# Patient Record
Sex: Female | Born: 1955
Health system: Southern US, Community
[De-identification: ages and names within clinical notes are randomized; demographics above are authoritative.]

## PROBLEM LIST (undated history)

## (undated) DIAGNOSIS — I639 Cerebral infarction, unspecified: Secondary | ICD-10-CM

## (undated) DIAGNOSIS — I10 Essential (primary) hypertension: Secondary | ICD-10-CM

## (undated) DIAGNOSIS — D6851 Activated protein C resistance: Secondary | ICD-10-CM

## (undated) DIAGNOSIS — D649 Anemia, unspecified: Secondary | ICD-10-CM

## (undated) HISTORY — DX: Anemia, unspecified: D64.9

## (undated) HISTORY — DX: Essential (primary) hypertension: I10

## (undated) HISTORY — DX: Cerebral infarction, unspecified: I63.9

## (undated) HISTORY — DX: Activated protein C resistance: D68.51

---

## 1997-09-13 ENCOUNTER — Other Ambulatory Visit: Admission: RE | Admit: 1997-09-13 | Discharge: 1997-09-13 | Payer: Self-pay | Admitting: Obstetrics and Gynecology

## 1997-10-19 ENCOUNTER — Other Ambulatory Visit: Admission: RE | Admit: 1997-10-19 | Discharge: 1997-10-19 | Payer: Self-pay | Admitting: General Surgery

## 1997-10-29 ENCOUNTER — Ambulatory Visit (HOSPITAL_BASED_OUTPATIENT_CLINIC_OR_DEPARTMENT_OTHER): Admission: RE | Admit: 1997-10-29 | Discharge: 1997-10-29 | Payer: Self-pay | Admitting: General Surgery

## 1998-09-19 ENCOUNTER — Other Ambulatory Visit: Admission: RE | Admit: 1998-09-19 | Discharge: 1998-09-19 | Payer: Self-pay | Admitting: Obstetrics and Gynecology

## 1999-11-07 ENCOUNTER — Other Ambulatory Visit: Admission: RE | Admit: 1999-11-07 | Discharge: 1999-11-07 | Payer: Self-pay | Admitting: Obstetrics and Gynecology

## 1999-11-30 ENCOUNTER — Other Ambulatory Visit: Admission: RE | Admit: 1999-11-30 | Discharge: 1999-11-30 | Payer: Self-pay | Admitting: Obstetrics and Gynecology

## 2000-01-11 ENCOUNTER — Other Ambulatory Visit: Admission: RE | Admit: 2000-01-11 | Discharge: 2000-01-11 | Payer: Self-pay | Admitting: General Surgery

## 2000-05-09 ENCOUNTER — Encounter: Admission: RE | Admit: 2000-05-09 | Discharge: 2000-05-09 | Payer: Self-pay | Admitting: General Surgery

## 2000-05-09 ENCOUNTER — Encounter: Payer: Self-pay | Admitting: General Surgery

## 2000-11-11 ENCOUNTER — Other Ambulatory Visit: Admission: RE | Admit: 2000-11-11 | Discharge: 2000-11-11 | Payer: Self-pay | Admitting: Obstetrics and Gynecology

## 2000-11-12 ENCOUNTER — Encounter (INDEPENDENT_AMBULATORY_CARE_PROVIDER_SITE_OTHER): Payer: Self-pay | Admitting: Specialist

## 2000-11-12 ENCOUNTER — Other Ambulatory Visit: Admission: RE | Admit: 2000-11-12 | Discharge: 2000-11-12 | Payer: Self-pay | Admitting: Obstetrics and Gynecology

## 2001-08-14 ENCOUNTER — Encounter: Admission: RE | Admit: 2001-08-14 | Discharge: 2001-08-14 | Payer: Self-pay | Admitting: Family Medicine

## 2001-08-14 ENCOUNTER — Encounter: Payer: Self-pay | Admitting: Family Medicine

## 2001-11-11 ENCOUNTER — Other Ambulatory Visit: Admission: RE | Admit: 2001-11-11 | Discharge: 2001-11-11 | Payer: Self-pay | Admitting: Obstetrics and Gynecology

## 2002-11-16 ENCOUNTER — Other Ambulatory Visit: Admission: RE | Admit: 2002-11-16 | Discharge: 2002-11-16 | Payer: Self-pay | Admitting: Obstetrics and Gynecology

## 2002-11-18 ENCOUNTER — Encounter: Payer: Self-pay | Admitting: Emergency Medicine

## 2002-11-18 ENCOUNTER — Inpatient Hospital Stay (HOSPITAL_COMMUNITY): Admission: EM | Admit: 2002-11-18 | Discharge: 2002-11-20 | Payer: Self-pay | Admitting: Emergency Medicine

## 2002-11-18 ENCOUNTER — Encounter: Payer: Self-pay | Admitting: Neurology

## 2002-11-19 ENCOUNTER — Encounter (INDEPENDENT_AMBULATORY_CARE_PROVIDER_SITE_OTHER): Payer: Self-pay | Admitting: Cardiology

## 2002-11-23 ENCOUNTER — Encounter: Payer: Self-pay | Admitting: Internal Medicine

## 2002-11-23 ENCOUNTER — Ambulatory Visit (HOSPITAL_COMMUNITY): Admission: RE | Admit: 2002-11-23 | Discharge: 2002-11-23 | Payer: Self-pay | Admitting: Internal Medicine

## 2003-01-22 ENCOUNTER — Ambulatory Visit (HOSPITAL_COMMUNITY): Admission: RE | Admit: 2003-01-22 | Discharge: 2003-01-22 | Payer: Self-pay | Admitting: Internal Medicine

## 2003-01-22 ENCOUNTER — Encounter: Payer: Self-pay | Admitting: Internal Medicine

## 2004-01-06 ENCOUNTER — Other Ambulatory Visit: Admission: RE | Admit: 2004-01-06 | Discharge: 2004-01-06 | Payer: Self-pay | Admitting: Obstetrics and Gynecology

## 2004-02-08 ENCOUNTER — Encounter: Admission: RE | Admit: 2004-02-08 | Discharge: 2004-02-08 | Payer: Self-pay | Admitting: Obstetrics and Gynecology

## 2004-05-17 ENCOUNTER — Inpatient Hospital Stay (HOSPITAL_COMMUNITY): Admission: EM | Admit: 2004-05-17 | Discharge: 2004-05-19 | Payer: Self-pay | Admitting: Unknown Physician Specialty

## 2004-05-18 ENCOUNTER — Ambulatory Visit: Payer: Self-pay | Admitting: Cardiovascular Disease

## 2005-01-11 ENCOUNTER — Other Ambulatory Visit: Admission: RE | Admit: 2005-01-11 | Discharge: 2005-01-11 | Payer: Self-pay | Admitting: Obstetrics and Gynecology

## 2010-05-30 ENCOUNTER — Ambulatory Visit: Admit: 2010-05-30 | Payer: Self-pay | Admitting: Obstetrics and Gynecology

## 2010-10-02 ENCOUNTER — Encounter: Payer: Self-pay | Admitting: Family Medicine

## 2010-10-06 NOTE — H&P (Signed)
[name], [name]                           ACCOUNT NO.:  [id number]   MEDICAL RECORD NO.:  [id number]                   PATIENT TYPE:  EMS   LOCATION:  MAJO                                 FACILITY:  [facility]   PHYSICIAN:  Genene Churn. Love, M.D.                 DATE OF BIRTH:  1955-12-09   DATE OF ADMISSION:  11/18/2002  DATE OF DISCHARGE:                                HISTORY & PHYSICAL   HISTORY OF PRESENT ILLNESS:  This is the first [facility] admission  for this 55 year old right-handed white married female from [place],  [place], admitted from the emergency room for evaluation of numbness  and right brain stroke.   HISTORY OF PRESENT ILLNESS:  This patient has had no history of high blood  pressure, diabetes, heart disease, stroke, cigarette, or drug use, hormone  replacement therapy, head or neck trauma, or migraine.  On Saturday, November 14, 2002, she noted the onset of left thumb and index finger numbness and  Sunday morning she noted numbness in the left third, fourth, and fifth  finger.  She had some intermittent tightness and tingling in her left hand  since that time. This morning, she was at work and noted left face and tip  of the nose numbness, called her physician who recommended that she come to  the emergency room for further evaluation.  She had been seen by the  physician's associate the day prior to this admission with suspected carpal  tunnel syndrome and placed on Mobic 7.5 mg b.i.d.  She has not been on any  aspirin or other medications.   PAST MEDICAL HISTORY:  Significant for right breast cyst removed in 1997,  left eye cyst surgery in 2004.  She has had no injuries.  She has had a  hospitalization for two children.   MEDICATIONS:  Mobic 7.5 mg b.i.d., but has only been on this for one day.   SOCIAL HISTORY:  She was educated through two years of college and works as  a Diplomatic Services operational officer.  She drinks three drinks of alcohol per day.   ALLERGIES:  DONNATAL, CODEINE, LIBRIUM, AND LIBRAX.   FAMILY HISTORY:  Her mother died at 51 from stroke. Her father died at age  70 from cancer and had hypertension. She has one brother 87 living and well.  She has one sister who died at 80 from breast cancer. She has children,  daughters 81 and 47 who are both living and well.   PHYSICAL EXAMINATION:  GENERAL: A well-developed, pleasant white female in  no acute distress.  VITAL SIGNS: Blood pressure in the right and left arm 140/80, heart rate 64,  there were no bruits.  NECK:  Flexion and extension maneuvers are unremarkable.  MENTAL STATUS: She was alert and oriented x3 and followed one, two, and  three-step commands.  There was no denial of her left  side or denial of  illness.  Her cranial nerve examination revealed visual fields to be full,  but discs were flat. The extraocular movements were full and corneals were  present. There was no 7th nerve palsy. Hearing was intact. Air conduction  was greater than bone conduction. Tongue was midline, uvula was midline and  gags were present.  Sternocleidomastoid and trapezius grossly normal.  Motor  examination revealed 5/5 strength in the right and left arm and in the right  and left leg, but some clumsiness in the left hand with some evidence of  decreased rapid alternating movement skills in the left hand. She had an  outstretched hand and arm tremor. Sensory examination was intact to  pinprick, touch, position, and vibration testing.  Deep tendon reflexes were  2+. Plantar responses were downgoing.  HEENT:  Tympanic membranes clear.  LUNGS:  Clear.  HEART:  Systolic murmur.  ABDOMEN: Bowel sounds were normal. There is no enlargement of the liver,  spleen, or kidneys.  There was no cyanosis, clubbing, or edema in the  extremities.   MRI study showed right parietal ischemic stroke. MRA showed some  irregularity to the right middle cerebral artery and PCA arteries.   IMPRESSION:   1. Right brain stroke.  343.01  2. Heart murmur.  295.2.   PLAN:  Work for atherosclerotic vascular disease and inflammatory vessel  disease and get a cardiac workup and place her on aspirin.                                               Genene Churn. Sandria Manly, M.D.    JML/MEDQ  D:  11/18/2002  T:  11/18/2002  Job:  [id number]   cc:   Ernestina Penna, M.D.  [address] [place]  [place] [address]  Fax: [phone]    cc:   Ernestina Penna, M.D.  [address] [place]  [place] [address]  Fax: [phone]

## 2010-10-06 NOTE — Discharge Summary (Signed)
[name], [name]                           ACCOUNT NO.:  [id number]   MEDICAL RECORD NO.:  [id number]                   PATIENT TYPE:  INP   LOCATION:  3020                                 FACILITY:  [facility]   PHYSICIAN:  Santina Evans A. Orlin Hilding, M.D.          DATE OF BIRTH:  03/05/1956   DATE OF ADMISSION:  11/18/2002  DATE OF DISCHARGE:  11/20/2002                                 DISCHARGE SUMMARY   DISCHARGE DIAGNOSES:  1. Right  middle cerebral artery embolic infarction.  2. Unknown transesophageal echocardiogram abnormality.   DISCHARGE MEDICATIONS:  Aspirin 325 mg daily.   PROCEDURES:  1. MRI of the brain shows a subacute brand right MCA distribution     infarction.  A MRA of the brain showed left posterior cerebral and     partial right middle cerebral artery distribution atherosclerosis. Early     bifurcation of the right M1 segment with some irregularity and stenotic     change.  2. Carotid Doppler normal.  3. Two-dimensional echocardiogram normal.  4. Transesophageal echocardiogram performed by Dr. Dietrich Pates showing left     atrium, left atrial appendage, right atrium without masses. No patent     foramen ovale by color Doppler or with injection of __________ saline.     Aortic valve is trileaflet, no right coronary cusp with question     echodensity, question redundancy of the leaflet, question separate mass     and long axis best seen near sinotubular junction. Trivial tricuspid     regurgitation, mild mitral regurgitation.  Left ventricle and right     ventricle normal. Ascending and descending thoracic aorta, otherwise,     normal.  5. Chest x-ray shows no acute disease. Scoliosis.   LABORATORY DATA:  Sodium 140, potassium 3.6, chloride 106, CO2 26, glucose  100, BUN 9, creatinine 0.7, calcium 9.3. Total protein 6.7, albumin 12.0,  AST 22, ALT 18, alkaline phosphatase 31, total bilirubin 1.3. Antithrombin  397.  Cardiolipin studies normal. CBC:  Hemoglobin 14.4,  hematocrit 42.1,  white blood cells 7, platelets 194, differential normal. Sed rate 0. ANA  negative. Antilipid antibody pending. Urine with 15 ketones; otherwise,  negative. Urine drug screen negative. Lupus anticoagulant not detected.  Protein S functional 92, protein C functional 104, G20210 pending,  homocysteine pending. Lipids with cholesterol 155, triglycerides 70, HDL 76  and LDL 63.   HISTORY OF PRESENT ILLNESS:  [name] is a 55 year old right-handed  female with no significant medical history who notes the onset of left thumb  and index finger numbness Saturday morning. Sunday morning she noticed  numbness in the third, fourth and fifth finger. She had intermittent  tightness and tingling in her left hand since that time. This morning when  she was at work she noticed left face and tip of the nose numbness. She saw  the physician who recommended her to come to the emergency  room for  evaluation.   A CT scan was canceled and a MRI was done instead through the emergency  room. It was positive for acute infarction. She was admitted for a further  stroke workup. She was not a TPA candidate secondary to time and severity of  symptoms.   HOSPITAL COURSE:  A MRI as above did reveal acute infarction. A workup was  negative and a TEE was performed. There was an abnormality on the TEE that  Dr. Dietrich Pates who performed it is unsure what it means. She is going to  have Dr. Charlton Haws also review and send a copy of the film to me  accompanied by their opinion. The patient will be put on antiplatelet of  aspirin and follow up with Dr. Orlin Hilding and Dr. Tenny Craw for further treatment.   CONDITION ON DISCHARGE:  The patient was alert and oriented x3. Speech  clear. No aphasia. No facial weakness. Visual fields were full and  extraocular movements were intact.  Chest clear to auscultation. Heart rate  regular. Strength is 5/5 with some patient perceived weakness in her left  upper  extremity. Gait is steady.   DISCHARGE PLAN:  1. Discharge home with husband.  2. Aspirin for secondary stroke prevention.  3. Follow up with Dr. Tenny Craw. Call her for appointment time. Will follow up     with Dr. Eden Emms in the [facility] of the TEE results.  4. Follow up with Dr. Orlin Hilding in for weeks.     Annie Main, N.P.                         Catherine A. Orlin Hilding, M.D.    SB/MEDQ  D:  11/20/2002  T:  11/22/2002  Job:  [id number]

## 2010-10-06 NOTE — Discharge Summary (Signed)
[name], [name]                 ACCOUNT NO.:  [id number]   MEDICAL RECORD NO.:  [id number]          PATIENT TYPE:  INP   LOCATION:  3017                         FACILITY:  [facility]   PHYSICIAN:  Pramod P. Pearlean Brownie, MD    DATE OF BIRTH:  06-02-1955   DATE OF ADMISSION:  05/17/2004  DATE OF DISCHARGE:  05/19/2004                                 DISCHARGE SUMMARY   ADMISSION DIAGNOSIS:  Aphasia.   DISCHARGE DIAGNOSES:  1.  Left middle cerebral artery branch infarction of embolic etiology      without definite identified source of embolism.  2.  Intracranial arteriosclerotic disease.  3.  Remote right middle cerebral artery infarction.   HISTORY OF PRESENT ILLNESS:  [name] is a pleasant 55 year old Caucasian  lady who developed sudden onset of speech and language difficulties on the  night prior to admission but that improved and she went to sleep.  The next  day, the husband noticed that she had trouble expressing herself over the  phone when he talked to her from work.  She was making paraphrasic errors  but she could understand him quite well.  There was no headache, focal  extremity weakness, gait or balance problems.  The patient was seen by her  primary physician, who ordered an outpatient MRI scan which was done at  [facility], which showed hemorrhagic infarction along  the left temporal lobe.  The patient was referred to [facility] emergency  room for further evaluation where she was seen by me and admitted for stroke  risk stratification.  The patient was monitored on the stroke unit with  telemetry monitoring and not having significant cardiac arrhythmias.  An MRA  of the brain and the neck was done.  MRA of the brain showed intracranial  arteriosclerotic disease with occlusion of the left temporal branch.  MRA of  the neck was read as showing more severe 75 to 90% left ICA at the origin;  however, I thought this was an old reading.  Subsequent carotid  artery  ultrasound was done which revealed no hemodynamically significant stenosis  involving the neck with left internal carotid artery, velocities being only  86.3.  The patient previously had a right hemispheric infarction in July of  2004 for which no definite etiology had been found despite negative  transesophageal echocardiogram as well as hypercoagulable panel.  On the  transesophageal echocardiogram at that time there was questionable  abnormality mentioned due to which during the present admission, we got an  MRI scan of the heart to evaluate that abnormality further; however, the MRI  scan of the heart was interpreted by Charlton Haws, M.D., cardiologist, was  normal without any significant abnormality.  Transcranial Doppler bubble  study was performed by me to look for evidence of patent foramen ovale;  however, this was negative and no HITS (high intensity transient signals)  were noted.  The patient had previously been on aspirin.  This was changed  to Aggrenox secondary to stroke prevention.  She was advised to take  Aggrenox once a day  for the first 10 days and increase it to twice a day.  She was also advised to undergo outpatient hypercoagulable panel labs in a  few days' time.  During the hospitalization she was kept on a heparin drip  and remained stable, and her heparin level and platelet counts were  monitored.  On the day of discharge she had only occasional word-finding  difficulties and no significant paraphrasic errors.  There was no other  focal neurological deficit.   DISCHARGE MEDICATIONS:  Aggrenox one capsule once a day for 10 days and then  twice a day.   DISCHARGE INSTRUCTIONS:  The patient was advised to follow up with Dr.  Molly Maduro More, her primary physician, as needed and with Dr. Pearlean Brownie in two  months.  She was also advised to have outpatient hypercoagulable labs drawn.       PPS/MEDQ  D:  05/19/2004  T:  05/19/2004  Job:  [id number]   cc:   Molly Maduro  More  [address] Jadene Pierini  [place]  [place]  [address]  Fax: [phone]

## 2010-10-06 NOTE — H&P (Signed)
NAMEANABETH, CHILCOTT                 ACCOUNT NO.:  [id number]   MEDICAL RECORD NO.:  [id number]          PATIENT TYPE:  EMS   LOCATION:  MAJO                         FACILITY:  [facility]   PHYSICIAN:  Pramod P. Pearlean Brownie, MD    DATE OF BIRTH:  [birth year]   DATE OF ADMISSION:  05/17/2004  DATE OF DISCHARGE:                                HISTORY & PHYSICAL   HISTORY OF PRESENT ILLNESS:  Ms. Isaza is a 55 year old pleasant Caucasian  lady who developed some speech difficulties which began last night.  While  she was talking on the phone ordering a pizza, she had some trouble  speaking.  At that time, the husband did not think much about the symptoms  as she was just distracted.  Later on apparently, she spoke fine.  The  husband woke up this morning and went to work and at that time the wife just  spoke a few words and said hi and good morning.  A few hours later, when he  called back from work, he noticed that his wife had trouble speaking and  expressing herself over the phone. She was substituting words and using the  wrong words.   He called the primary physician who ordered an outpatient MRI which was done  at [facility] this afternoon which showed subacute left middle  cerebral artery branch infarction prompting referral to the emergency room  for further evaluation.  The patient has noticed some improvement in her  speech but she still had some word hesitancy and word finding difficulties.  She has noticed a headache since this evening which is mild to moderate and  left hemispheric but not severe.  She denies any vision, gait, or balance  difficulties.   PAST MEDICAL HISTORY:  Stroke a year and a half ago.  At that time, she had  intermittent paraesthesia in left hand as well as her face which were  initially thought to be carpal tunnel but subsequent evaluation revealed a  right hemispheric infarction.  She was admitted to [facility] and underwent stroke  evaluation but no apparently no specific  etiology was found.  She was placed on aspirin which she has been taking  regularly and has not been maintaining this.  Her past medical history is  otherwise unremarkable.   PAST SURGICAL HISTORY:  Benign breast cyst removed.   MEDICATIONS:  Aspirin.   ALLERGIES:  LIBRAX, CODEINE and DONNATAL.   SOCIAL HISTORY:  The patient lives at home with her husband.  She does not  smoke or drink.   FAMILY HISTORY:  Significant for her mother having multiple TIAs and massive  stroke.   REVIEW OF SYMPTOMS:  Not significant for any chest pain, palpitations,  cough, shortness of breath, or diarrhea.   PHYSICAL EXAMINATION:  GENERAL:  A pleasant middle-aged Caucasian lady who  is not in distress.  VITAL SIGNS:  She is afebrile.  Pulse is 72 per minute and regular sinus,  blood pressure 145/82.  HEENT:  Normocephalic and atraumatic.  Unremarkable.  NECK:  Supple without bruit.  CARDIOVASCULAR:  No murmur or gallop.  LUNGS:  Clear to auscultation.  NEUROLOGICAL:  The patient is pleasant, alert, and cooperative.  There is no  dysarthria.  She speaks fluently with occasional word finding difficulties.  She is able to name but has some difficulties with naming parts of objects.  She also has trouble repeating difficult sentences and can repeat only short  phrases.  She can understand quite well.  There is no slurred speech.  Movements are full.  Visual fields are full to confrontational testing.  Visual acuity is adequate.  Face is symmetric bilaterally.  Movements are  normal.  Tongue is midline.  Motor system exam reveals no upper extremity  drift.  Symmetric strength to reflexes, coordination, and sensation.  There  is no evidence of ataxia.  Knee-to-heel coordination are accurate.  Plantars  are downgoing.  Sensation is intact.   LABORATORY DATA:  MRI of the brain done at [facility] report  apparently shows acute to subacute infarct along the  left temporal and  parietal lobes.  The actual films are not available.  The patient also has  white mater changes.  EKG done today reveals normal sinus rhythm.   IMPRESSION:  A 55 year old lady with sudden onset of subtle language  difficulties due to left middle cerebral artery branch infarction, etiology  to be determined.  The patient had a previous history of right hemispheric  infarction with extensive evaluation which did not reveal a specific  etiology.   PLAN:  We will admit the patient to stroke service for further risk  stratification workup, telemetry monitoring.  Start her on IV heparin until  stroke evaluation is completed.  We will check a MRA of the brain and neck,  doppler studies, fasting lipid profile, hemoglobin A1C, and homocystine.  If  she has not had a transesophageal echocardiogram in the last admission, we  will consider doing this as well as hypercoagulable workup.       PPS/MEDQ  D:  05/17/2004  T:  05/17/2004  Job:  [id number]   cc:   Christell Constant, M.D.  [place], [place]

## 2012-08-11 ENCOUNTER — Telehealth: Payer: Self-pay | Admitting: Family Medicine

## 2012-08-11 NOTE — Telephone Encounter (Signed)
NEEDS APPT TODAY. SHE HAS A REALLY BAD COUGH, HEAD IS REALLY STOPPED UP AND HER CHEST IS CONGESTED

## 2012-08-11 NOTE — Telephone Encounter (Signed)
COUGH, SINUS, NO FEVER, NO APPTS AVAILABLE. REFUSED AOOT TOM. WILL GO TO URGENT CARE

## 2012-08-25 ENCOUNTER — Other Ambulatory Visit: Payer: Self-pay | Admitting: *Deleted

## 2012-08-25 MED ORDER — WARFARIN SODIUM 5 MG PO TABS: 5.0000 mg | ORAL_TABLET | ORAL | Status: DC

## 2012-08-28 ENCOUNTER — Ambulatory Visit (INDEPENDENT_AMBULATORY_CARE_PROVIDER_SITE_OTHER): Payer: 59 | Admitting: Pharmacist

## 2012-08-28 DIAGNOSIS — I639 Cerebral infarction, unspecified: Secondary | ICD-10-CM

## 2012-08-28 DIAGNOSIS — D6859 Other primary thrombophilia: Secondary | ICD-10-CM

## 2012-08-28 DIAGNOSIS — I6350 Cerebral infarction due to unspecified occlusion or stenosis of unspecified cerebral artery: Secondary | ICD-10-CM

## 2012-08-28 DIAGNOSIS — D6851 Activated protein C resistance: Secondary | ICD-10-CM | POA: Insufficient documentation

## 2012-08-28 LAB — POCT INR: INR: 3

## 2012-09-23 ENCOUNTER — Other Ambulatory Visit: Payer: Self-pay

## 2012-09-23 MED ORDER — WARFARIN SODIUM 5 MG PO TABS: 5.0000 mg | ORAL_TABLET | ORAL | Status: DC

## 2012-10-09 ENCOUNTER — Ambulatory Visit (INDEPENDENT_AMBULATORY_CARE_PROVIDER_SITE_OTHER): Payer: PRIVATE HEALTH INSURANCE | Admitting: Pharmacist

## 2012-10-09 DIAGNOSIS — D6851 Activated protein C resistance: Secondary | ICD-10-CM

## 2012-10-09 DIAGNOSIS — I6350 Cerebral infarction due to unspecified occlusion or stenosis of unspecified cerebral artery: Secondary | ICD-10-CM

## 2012-10-09 DIAGNOSIS — D6859 Other primary thrombophilia: Secondary | ICD-10-CM

## 2012-10-09 DIAGNOSIS — I639 Cerebral infarction, unspecified: Secondary | ICD-10-CM

## 2012-10-09 LAB — POCT INR: INR: 2.8

## 2012-10-24 ENCOUNTER — Other Ambulatory Visit: Payer: Self-pay | Admitting: *Deleted

## 2012-10-24 MED ORDER — WARFARIN SODIUM 5 MG PO TABS: 5.0000 mg | ORAL_TABLET | ORAL | Status: DC

## 2012-10-27 ENCOUNTER — Telehealth: Payer: Self-pay | Admitting: Family Medicine

## 2012-10-28 MED ORDER — LORATADINE 10 MG PO TABS: 10.0000 mg | ORAL_TABLET | Freq: Every day | ORAL | Status: DC

## 2012-10-28 NOTE — Telephone Encounter (Signed)
done

## 2012-11-05 ENCOUNTER — Encounter: Payer: Self-pay | Admitting: Family Medicine

## 2012-11-19 ENCOUNTER — Telehealth: Payer: Self-pay | Admitting: Pharmacist

## 2012-11-19 NOTE — Telephone Encounter (Signed)
Patient wanted to know about cellulite treatment and warfarin.  She visit [facility] and they counseled her about a procedure for cellulite but once they found out she was on warfarin the physician on staff said she was not a candidate.  I told patient I agreed with physician and suggested she could look into Thermage which is a cellulite treatment that can be used in patient's on warfarin.

## 2012-11-24 ENCOUNTER — Other Ambulatory Visit: Payer: Self-pay

## 2012-11-24 NOTE — Telephone Encounter (Signed)
Last PT  2/14

## 2012-11-25 MED ORDER — WARFARIN SODIUM 5 MG PO TABS: 5.0000 mg | ORAL_TABLET | ORAL | Status: DC

## 2012-11-27 ENCOUNTER — Ambulatory Visit (INDEPENDENT_AMBULATORY_CARE_PROVIDER_SITE_OTHER): Payer: PRIVATE HEALTH INSURANCE | Admitting: Pharmacist

## 2012-11-27 DIAGNOSIS — D6851 Activated protein C resistance: Secondary | ICD-10-CM

## 2012-11-27 DIAGNOSIS — I639 Cerebral infarction, unspecified: Secondary | ICD-10-CM

## 2012-11-27 DIAGNOSIS — D6859 Other primary thrombophilia: Secondary | ICD-10-CM

## 2012-11-27 DIAGNOSIS — I6350 Cerebral infarction due to unspecified occlusion or stenosis of unspecified cerebral artery: Secondary | ICD-10-CM

## 2012-11-27 LAB — POCT INR: INR: 2.6

## 2013-01-09 ENCOUNTER — Encounter: Payer: Self-pay | Admitting: Family Medicine

## 2013-01-22 ENCOUNTER — Ambulatory Visit (INDEPENDENT_AMBULATORY_CARE_PROVIDER_SITE_OTHER): Payer: PRIVATE HEALTH INSURANCE | Admitting: Pharmacist

## 2013-01-22 DIAGNOSIS — I6350 Cerebral infarction due to unspecified occlusion or stenosis of unspecified cerebral artery: Secondary | ICD-10-CM

## 2013-01-22 DIAGNOSIS — I639 Cerebral infarction, unspecified: Secondary | ICD-10-CM

## 2013-01-22 DIAGNOSIS — D6851 Activated protein C resistance: Secondary | ICD-10-CM

## 2013-01-22 DIAGNOSIS — D6859 Other primary thrombophilia: Secondary | ICD-10-CM

## 2013-01-22 LAB — POCT INR: INR: 3.5

## 2013-01-22 NOTE — Patient Instructions (Signed)
Anticoagulation Dose Instructions as of 01/22/2013     [name] Tue Wed Thu Fri Sat   New Dose 10 mg 10 mg 10 mg 10 mg 10 mg 10 mg 10 mg    Description       Hold for 1 day, then continue 10mg  daily      INR was 3.5 today

## 2013-02-19 ENCOUNTER — Ambulatory Visit (INDEPENDENT_AMBULATORY_CARE_PROVIDER_SITE_OTHER): Payer: PRIVATE HEALTH INSURANCE | Admitting: Pharmacist

## 2013-02-19 DIAGNOSIS — Z23 Encounter for immunization: Secondary | ICD-10-CM

## 2013-02-19 DIAGNOSIS — I639 Cerebral infarction, unspecified: Secondary | ICD-10-CM

## 2013-02-19 DIAGNOSIS — D6851 Activated protein C resistance: Secondary | ICD-10-CM

## 2013-02-19 DIAGNOSIS — D6859 Other primary thrombophilia: Secondary | ICD-10-CM

## 2013-02-19 DIAGNOSIS — I6350 Cerebral infarction due to unspecified occlusion or stenosis of unspecified cerebral artery: Secondary | ICD-10-CM

## 2013-04-02 ENCOUNTER — Ambulatory Visit (INDEPENDENT_AMBULATORY_CARE_PROVIDER_SITE_OTHER): Payer: PRIVATE HEALTH INSURANCE | Admitting: Pharmacist

## 2013-04-02 DIAGNOSIS — I6350 Cerebral infarction due to unspecified occlusion or stenosis of unspecified cerebral artery: Secondary | ICD-10-CM

## 2013-04-02 DIAGNOSIS — D6851 Activated protein C resistance: Secondary | ICD-10-CM

## 2013-04-02 DIAGNOSIS — D6859 Other primary thrombophilia: Secondary | ICD-10-CM

## 2013-04-02 DIAGNOSIS — I639 Cerebral infarction, unspecified: Secondary | ICD-10-CM

## 2013-04-02 LAB — POCT INR: INR: 1.9

## 2013-04-02 NOTE — Patient Instructions (Signed)
Anticoagulation Dose Instructions as of 04/02/2013     [name] Tue Wed Thu Fri Sat   New Dose 10 mg 10 mg 10 mg 10 mg 10 mg 10 mg 10 mg    Description       Take 12.5mg  today the continue 10mg  daily      INR was 1.9 today

## 2013-05-07 ENCOUNTER — Ambulatory Visit (INDEPENDENT_AMBULATORY_CARE_PROVIDER_SITE_OTHER): Payer: PRIVATE HEALTH INSURANCE | Admitting: Pharmacist

## 2013-05-07 DIAGNOSIS — I6350 Cerebral infarction due to unspecified occlusion or stenosis of unspecified cerebral artery: Secondary | ICD-10-CM

## 2013-05-07 DIAGNOSIS — I639 Cerebral infarction, unspecified: Secondary | ICD-10-CM

## 2013-05-07 DIAGNOSIS — D6851 Activated protein C resistance: Secondary | ICD-10-CM

## 2013-05-07 DIAGNOSIS — D6859 Other primary thrombophilia: Secondary | ICD-10-CM

## 2013-05-07 LAB — POCT INR: INR: 2

## 2013-05-07 NOTE — Patient Instructions (Addendum)
Anticoagulation Dose Instructions as of 05/07/2013     [name] Tue Wed Thu Fri Sat   New Dose 10 mg 10 mg 10 mg 10 mg 10 mg 10 mg 10 mg    Description       Continue 10mg  daily      INR was 2.0 today

## 2013-06-18 ENCOUNTER — Ambulatory Visit (INDEPENDENT_AMBULATORY_CARE_PROVIDER_SITE_OTHER): Payer: PRIVATE HEALTH INSURANCE | Admitting: Pharmacist

## 2013-06-18 DIAGNOSIS — Z23 Encounter for immunization: Secondary | ICD-10-CM

## 2013-06-18 DIAGNOSIS — D6859 Other primary thrombophilia: Secondary | ICD-10-CM

## 2013-06-18 DIAGNOSIS — D6851 Activated protein C resistance: Secondary | ICD-10-CM

## 2013-06-18 DIAGNOSIS — I6350 Cerebral infarction due to unspecified occlusion or stenosis of unspecified cerebral artery: Secondary | ICD-10-CM

## 2013-06-18 DIAGNOSIS — I639 Cerebral infarction, unspecified: Secondary | ICD-10-CM

## 2013-06-18 LAB — POCT INR: INR: 1.9

## 2013-06-18 NOTE — Patient Instructions (Signed)
Anticoagulation Dose Instructions as of 06/18/2013     [name] Mon Tue Wed Thu Fri Sat   New Dose 10 mg 10 mg 10 mg 10 mg 12.5 mg 10 mg 10 mg    Description       Increase to 2 and 1/2 tablets on Thursdays and 2 tablet all other days.      INR was 1.9 today

## 2013-06-19 ENCOUNTER — Other Ambulatory Visit: Payer: Self-pay | Admitting: *Deleted

## 2013-06-19 MED ORDER — DICLOFENAC SODIUM 1 % TD GEL: TRANSDERMAL | Status: DC

## 2013-06-19 NOTE — Telephone Encounter (Signed)
This may be refilled 

## 2013-06-19 NOTE — Telephone Encounter (Signed)
Received fax from pharmacy for refill but was not on current med list. Looks like it was originally rxd on 04-25-12. Please advise

## 2013-06-22 ENCOUNTER — Telehealth: Payer: Self-pay | Admitting: Pharmacist

## 2013-06-22 NOTE — Telephone Encounter (Signed)
Advised patient that voltaren gel may affect coumadin levels per Dr Christell ConstantMoore. Advised that when she came in to have PT/INR done that she should remind Tammy or Marcelino DusterMichelle that she is using.

## 2013-06-29 ENCOUNTER — Telehealth: Payer: Self-pay | Admitting: Pharmacist

## 2013-06-29 NOTE — Telephone Encounter (Signed)
Patient calls because dermatologist has prescribed minocycline in place of oracea.  She is wondering about the possible effect on INR / warfarin.  I spoke with patient and she will finish oracea that she has and then switch to minocycline which will be 2 weeks before her next appt.   I don't believe there will be much difference in effect of Oracea versus minocycline on INR sine they are in similar therapeutic classes but will be able to assess and make warfarin dosing adjustments with plan above.

## 2013-07-23 ENCOUNTER — Telehealth: Payer: Self-pay | Admitting: Pharmacist

## 2013-07-23 ENCOUNTER — Ambulatory Visit (INDEPENDENT_AMBULATORY_CARE_PROVIDER_SITE_OTHER): Payer: PRIVATE HEALTH INSURANCE | Admitting: Pharmacist

## 2013-07-23 DIAGNOSIS — I639 Cerebral infarction, unspecified: Secondary | ICD-10-CM

## 2013-07-23 DIAGNOSIS — D6851 Activated protein C resistance: Secondary | ICD-10-CM

## 2013-07-23 DIAGNOSIS — D6859 Other primary thrombophilia: Secondary | ICD-10-CM

## 2013-07-23 DIAGNOSIS — I6350 Cerebral infarction due to unspecified occlusion or stenosis of unspecified cerebral artery: Secondary | ICD-10-CM

## 2013-07-23 LAB — POCT INR: INR: 3.2

## 2013-07-23 NOTE — Telephone Encounter (Signed)
Patient called - wanted to verify warfarin dosing.

## 2013-08-16 ENCOUNTER — Other Ambulatory Visit: Payer: Self-pay | Admitting: Family Medicine

## 2013-09-03 ENCOUNTER — Ambulatory Visit (INDEPENDENT_AMBULATORY_CARE_PROVIDER_SITE_OTHER): Payer: 59 | Admitting: Pharmacist

## 2013-09-03 DIAGNOSIS — D6859 Other primary thrombophilia: Secondary | ICD-10-CM

## 2013-09-03 DIAGNOSIS — D6851 Activated protein C resistance: Secondary | ICD-10-CM

## 2013-09-03 DIAGNOSIS — I6350 Cerebral infarction due to unspecified occlusion or stenosis of unspecified cerebral artery: Secondary | ICD-10-CM

## 2013-09-03 DIAGNOSIS — I639 Cerebral infarction, unspecified: Secondary | ICD-10-CM

## 2013-09-03 LAB — POCT INR: INR: 2.3

## 2013-09-03 NOTE — Patient Instructions (Signed)
Anticoagulation Dose Instructions as of 09/03/2013     Emily SmilesSun Mon Tue Wed Thu Fri Sat   New Dose 10 mg 10 mg 10 mg 10 mg 10 mg 10 mg 10 mg    Description       Change back to 2 tablets every day      INR was 2.3 today

## 2013-10-14 ENCOUNTER — Telehealth: Payer: Self-pay | Admitting: Pharmacist

## 2013-10-14 NOTE — Telephone Encounter (Signed)
Patient has appt tomorrow for recheck protime - she is having some hand pain that she thinks might be related to lifting weights but it is a little better this afternoon.  If needed will work her in to see someone for evaluate hand pain tomorrow when here for protime. Patient called.

## 2013-10-15 ENCOUNTER — Ambulatory Visit (INDEPENDENT_AMBULATORY_CARE_PROVIDER_SITE_OTHER): Payer: 59 | Admitting: Pharmacist

## 2013-10-15 DIAGNOSIS — D6859 Other primary thrombophilia: Secondary | ICD-10-CM

## 2013-10-15 DIAGNOSIS — I6350 Cerebral infarction due to unspecified occlusion or stenosis of unspecified cerebral artery: Secondary | ICD-10-CM

## 2013-10-15 DIAGNOSIS — D6851 Activated protein C resistance: Secondary | ICD-10-CM

## 2013-10-15 DIAGNOSIS — I639 Cerebral infarction, unspecified: Secondary | ICD-10-CM

## 2013-10-15 LAB — POCT INR: INR: 2.1

## 2013-10-15 NOTE — Patient Instructions (Signed)
Anticoagulation Dose Instructions as of 10/15/2013     [name] Tue Wed Thu Fri Sat   New Dose 10 mg 10 mg 10 mg 10 mg 10 mg 10 mg 10 mg    Description       Continue warfarin at current dose of 2 tablets every day     INR was 2.1 today

## 2013-10-19 ENCOUNTER — Other Ambulatory Visit: Payer: Self-pay | Admitting: Family Medicine

## 2013-11-03 ENCOUNTER — Encounter: Payer: Self-pay | Admitting: Family Medicine

## 2013-11-23 ENCOUNTER — Other Ambulatory Visit: Payer: Self-pay | Admitting: Pharmacist

## 2013-11-23 MED ORDER — KETOPROFEN 10 % CREA: TOPICAL_CREAM | Status: DC

## 2013-11-24 ENCOUNTER — Telehealth: Payer: Self-pay | Admitting: *Deleted

## 2013-11-24 NOTE — Telephone Encounter (Signed)
Tammy can you review please. It was ordered on 11/23/13 by you.

## 2013-11-24 NOTE — Telephone Encounter (Signed)
I am not sure who prescribed this cream. More details are needed.

## 2013-11-24 NOTE — Telephone Encounter (Signed)
[facility] are not able to get Ketoprofen cream. Please send new med to [facility] if you want to order?

## 2013-11-25 MED ORDER — KETOPROFEN 10 % CREA: TOPICAL_CREAM | Status: DC

## 2013-11-25 NOTE — Telephone Encounter (Signed)
rx needs to be compounded - [facility]

## 2013-11-26 ENCOUNTER — Ambulatory Visit (INDEPENDENT_AMBULATORY_CARE_PROVIDER_SITE_OTHER): Payer: 59 | Admitting: Pharmacist

## 2013-11-26 DIAGNOSIS — I639 Cerebral infarction, unspecified: Secondary | ICD-10-CM

## 2013-11-26 DIAGNOSIS — I6350 Cerebral infarction due to unspecified occlusion or stenosis of unspecified cerebral artery: Secondary | ICD-10-CM

## 2013-11-26 DIAGNOSIS — D6859 Other primary thrombophilia: Secondary | ICD-10-CM

## 2013-11-26 DIAGNOSIS — D6851 Activated protein C resistance: Secondary | ICD-10-CM

## 2013-11-26 LAB — POCT INR: INR: 2.4

## 2013-11-26 NOTE — Patient Instructions (Signed)
Anticoagulation Dose Instructions as of 11/26/2013     Glynis SmilesSun Mon Tue Wed Thu Fri Sat   New Dose 10 mg 10 mg 10 mg 10 mg 10 mg 10 mg 10 mg    Description       Continue warfarin at current dose of 2 tablets every day      INR was 2.4 today

## 2013-12-07 ENCOUNTER — Other Ambulatory Visit: Payer: Self-pay | Admitting: Family Medicine

## 2013-12-07 NOTE — Telephone Encounter (Signed)
Please have patient make an appointment to be seen in some time in the future.

## 2013-12-07 NOTE — Telephone Encounter (Signed)
Only seen in Epic by pharmacist. Please advise on refill

## 2014-01-08 ENCOUNTER — Other Ambulatory Visit: Payer: Self-pay | Admitting: Nurse Practitioner

## 2014-01-14 ENCOUNTER — Ambulatory Visit (INDEPENDENT_AMBULATORY_CARE_PROVIDER_SITE_OTHER): Payer: 59 | Admitting: Pharmacist

## 2014-01-14 DIAGNOSIS — I6350 Cerebral infarction due to unspecified occlusion or stenosis of unspecified cerebral artery: Secondary | ICD-10-CM

## 2014-01-14 DIAGNOSIS — D6851 Activated protein C resistance: Secondary | ICD-10-CM

## 2014-01-14 DIAGNOSIS — D6859 Other primary thrombophilia: Secondary | ICD-10-CM

## 2014-01-14 DIAGNOSIS — I639 Cerebral infarction, unspecified: Secondary | ICD-10-CM

## 2014-01-14 LAB — POCT INR: INR: 2.4

## 2014-01-14 NOTE — Patient Instructions (Signed)
Anticoagulation Dose Instructions as of 01/14/2014     [name] Tue Wed Thu Fri Sat   New Dose 10 mg 10 mg 10 mg 10 mg 10 mg 10 mg 10 mg    Description       Continue warfarin at current dose of 2 tablets every day     INR was 2.4 today

## 2014-02-07 ENCOUNTER — Other Ambulatory Visit: Payer: Self-pay | Admitting: Family Medicine

## 2014-02-17 ENCOUNTER — Telehealth: Payer: Self-pay | Admitting: Family Medicine

## 2014-02-17 NOTE — Telephone Encounter (Signed)
Patient to come in tomorrow around lunch and I will work in whenever I can.

## 2014-02-18 ENCOUNTER — Ambulatory Visit (INDEPENDENT_AMBULATORY_CARE_PROVIDER_SITE_OTHER): Payer: 59 | Admitting: Pharmacist

## 2014-02-18 DIAGNOSIS — D6851 Activated protein C resistance: Secondary | ICD-10-CM

## 2014-02-18 DIAGNOSIS — I639 Cerebral infarction, unspecified: Secondary | ICD-10-CM

## 2014-02-18 LAB — POCT INR: INR: 2.5

## 2014-02-18 NOTE — Patient Instructions (Signed)
Anticoagulation Dose Instructions as of 02/18/2014     Emily SmilesSun Mon Tue Wed Thu Fri Sat   New Dose 10 Schaefer 10 Schaefer 10 Schaefer 10 Schaefer 10 Schaefer 10 Schaefer 10 Schaefer    Description       Continue warfarin at current dose of 2 tablets every day      INR 2.5 today

## 2014-04-01 ENCOUNTER — Ambulatory Visit (INDEPENDENT_AMBULATORY_CARE_PROVIDER_SITE_OTHER): Payer: 59 | Admitting: Pharmacist

## 2014-04-01 DIAGNOSIS — D6851 Activated protein C resistance: Secondary | ICD-10-CM

## 2014-04-01 DIAGNOSIS — I639 Cerebral infarction, unspecified: Secondary | ICD-10-CM

## 2014-04-01 DIAGNOSIS — Z23 Encounter for immunization: Secondary | ICD-10-CM

## 2014-04-01 LAB — POCT INR: INR: 2.4

## 2014-04-01 NOTE — Patient Instructions (Signed)
Anticoagulation Dose Instructions as of 04/01/2014      Emily SmilesSun Mon Tue Wed Thu Fri Sat   New Dose 10 mg 10 mg 10 mg 10 mg 10 mg 10 mg 10 mg    Description        Continue warfarin at current dose of 2 tablets = 10mg  once daily      INR was 2.4 today

## 2014-05-18 ENCOUNTER — Ambulatory Visit (INDEPENDENT_AMBULATORY_CARE_PROVIDER_SITE_OTHER): Payer: 59 | Admitting: Pharmacist Clinician (PhC)/ Clinical Pharmacy Specialist

## 2014-05-18 DIAGNOSIS — D6851 Activated protein C resistance: Secondary | ICD-10-CM

## 2014-05-18 DIAGNOSIS — I639 Cerebral infarction, unspecified: Secondary | ICD-10-CM

## 2014-05-18 LAB — POCT INR: INR: 3

## 2014-06-07 ENCOUNTER — Other Ambulatory Visit: Payer: Self-pay | Admitting: Family Medicine

## 2014-07-08 ENCOUNTER — Ambulatory Visit (INDEPENDENT_AMBULATORY_CARE_PROVIDER_SITE_OTHER): Payer: 59 | Admitting: Pharmacist

## 2014-07-08 DIAGNOSIS — I639 Cerebral infarction, unspecified: Secondary | ICD-10-CM

## 2014-07-08 DIAGNOSIS — D6851 Activated protein C resistance: Secondary | ICD-10-CM

## 2014-07-08 LAB — POCT INR: INR: 1.6

## 2014-07-08 NOTE — Patient Instructions (Signed)
Anticoagulation Dose Instructions as of 07/08/2014      Emily SmilesSun Mon Tue Wed Thu Fri Sat   New Dose 10 mg 10 mg 10 mg 10 mg 10 mg 10 mg 10 mg    Description        Take extra 1/2 tablet today and tomorrowContinue warfarin at current dose of 2 tablets = 10mg  once daily      INR was 1.6 today  Goal is 2.0 to 3.0 Reading less than 2.0 = too thick / increase risk of stroke or blood clot Reading over 3.0 = too thin / increase risk of bleeding

## 2014-08-02 ENCOUNTER — Other Ambulatory Visit: Payer: Self-pay | Admitting: Pharmacist

## 2014-08-05 ENCOUNTER — Ambulatory Visit (INDEPENDENT_AMBULATORY_CARE_PROVIDER_SITE_OTHER): Payer: 59 | Admitting: Pharmacist

## 2014-08-05 DIAGNOSIS — I6340 Cerebral infarction due to embolism of unspecified cerebral artery: Secondary | ICD-10-CM | POA: Diagnosis not present

## 2014-08-05 DIAGNOSIS — D6851 Activated protein C resistance: Secondary | ICD-10-CM

## 2014-08-05 DIAGNOSIS — I639 Cerebral infarction, unspecified: Secondary | ICD-10-CM

## 2014-08-05 LAB — POCT INR: INR: 2

## 2014-08-19 ENCOUNTER — Other Ambulatory Visit: Payer: Self-pay | Admitting: Obstetrics and Gynecology

## 2014-09-16 ENCOUNTER — Ambulatory Visit (INDEPENDENT_AMBULATORY_CARE_PROVIDER_SITE_OTHER): Payer: 59 | Admitting: Pharmacist

## 2014-09-16 DIAGNOSIS — D6851 Activated protein C resistance: Secondary | ICD-10-CM

## 2014-09-16 DIAGNOSIS — I639 Cerebral infarction, unspecified: Secondary | ICD-10-CM | POA: Diagnosis not present

## 2014-09-16 DIAGNOSIS — I6340 Cerebral infarction due to embolism of unspecified cerebral artery: Secondary | ICD-10-CM

## 2014-09-16 LAB — POCT INR: INR: 1.8

## 2014-09-16 NOTE — Patient Instructions (Signed)
Anticoagulation Dose Instructions as of 09/16/2014      Glynis SmilesSun Mon Tue Wed Thu Fri Sat   New Dose 10 mg 10 mg 10 mg 10 mg 10 mg 10 mg 10 mg    Description        Take extra 1/2 tablet today - Thursday, April 28th, then continue warfarin at current dose of 2 tablets = 10mg  once daily     INR was 1.8 today.

## 2014-10-21 ENCOUNTER — Ambulatory Visit (INDEPENDENT_AMBULATORY_CARE_PROVIDER_SITE_OTHER): Payer: 59 | Admitting: Pharmacist

## 2014-10-21 DIAGNOSIS — D6851 Activated protein C resistance: Secondary | ICD-10-CM

## 2014-10-21 DIAGNOSIS — I6340 Cerebral infarction due to embolism of unspecified cerebral artery: Secondary | ICD-10-CM

## 2014-10-21 DIAGNOSIS — I639 Cerebral infarction, unspecified: Secondary | ICD-10-CM | POA: Diagnosis not present

## 2014-10-21 LAB — POCT INR: INR: 2.9

## 2014-10-21 NOTE — Patient Instructions (Signed)
Anticoagulation Dose Instructions as of 10/21/2014      Emily SmilesSun Mon Tue Wed Thu Fri Sat   New Dose 10 mg 10 mg 10 mg 10 mg 10 mg 10 mg 10 mg    Description         continue warfarin at current dose of 2 tablets = 10mg  once daily      INR was 2.9 today

## 2014-12-02 ENCOUNTER — Ambulatory Visit (INDEPENDENT_AMBULATORY_CARE_PROVIDER_SITE_OTHER): Payer: 59 | Admitting: Pharmacist

## 2014-12-02 DIAGNOSIS — D6851 Activated protein C resistance: Secondary | ICD-10-CM | POA: Diagnosis not present

## 2014-12-02 DIAGNOSIS — I639 Cerebral infarction, unspecified: Secondary | ICD-10-CM | POA: Diagnosis not present

## 2014-12-02 LAB — POCT INR: INR: 2.9

## 2014-12-02 NOTE — Patient Instructions (Signed)
Anticoagulation Dose Instructions as of 12/02/2014      Glynis SmilesSun Mon Tue Wed Thu Fri Sat   New Dose 10 mg 10 mg 10 mg 10 mg 10 mg 10 mg 10 mg    Description         continue warfarin at current dose of 2 tablets = 10mg  once daily     INR was 2.9 today

## 2015-01-13 ENCOUNTER — Ambulatory Visit (INDEPENDENT_AMBULATORY_CARE_PROVIDER_SITE_OTHER): Payer: 59 | Admitting: Pharmacist

## 2015-01-13 DIAGNOSIS — I639 Cerebral infarction, unspecified: Secondary | ICD-10-CM

## 2015-01-13 DIAGNOSIS — D6851 Activated protein C resistance: Secondary | ICD-10-CM

## 2015-01-13 LAB — POCT INR: INR: 3.5

## 2015-01-13 NOTE — Patient Instructions (Signed)
Anticoagulation Dose Instructions as of 01/13/2015      Glynis Smiles Tue Wed Thu Fri Sat   New Dose 10 mg 10 mg 10 mg 10 mg 10 mg 10 mg 10 mg    Description        No warfarin today - then restart warfarin at current dose of 2 tablets =  once daily.     INR was 3.5 today

## 2015-01-30 ENCOUNTER — Other Ambulatory Visit: Payer: Self-pay | Admitting: Pharmacist

## 2015-02-10 ENCOUNTER — Ambulatory Visit (INDEPENDENT_AMBULATORY_CARE_PROVIDER_SITE_OTHER): Payer: 59 | Admitting: Pharmacist

## 2015-02-10 ENCOUNTER — Other Ambulatory Visit: Payer: Self-pay | Admitting: Pharmacist

## 2015-02-10 DIAGNOSIS — I639 Cerebral infarction, unspecified: Secondary | ICD-10-CM | POA: Diagnosis not present

## 2015-02-10 DIAGNOSIS — D6851 Activated protein C resistance: Secondary | ICD-10-CM | POA: Diagnosis not present

## 2015-02-10 LAB — POCT INR: INR: 1.9

## 2015-02-10 MED ORDER — KETOPROFEN 10 % CREA: TOPICAL_CREAM | Status: DC

## 2015-02-10 NOTE — Patient Instructions (Signed)
Anticoagulation Dose Instructions as of 02/10/2015      [name] Tue Wed Thu Fri Sat   New Dose 10 mg 10 mg 10 mg 10 mg 10 mg 10 mg 10 mg    Description        Take an extra 1/2 tablet today then resume warfarin at current dose of 2 tablets =  once daily.     INR was 1.9 today

## 2015-03-17 ENCOUNTER — Ambulatory Visit (INDEPENDENT_AMBULATORY_CARE_PROVIDER_SITE_OTHER): Payer: 59 | Admitting: Pharmacist

## 2015-03-17 DIAGNOSIS — Z23 Encounter for immunization: Secondary | ICD-10-CM | POA: Diagnosis not present

## 2015-03-17 DIAGNOSIS — D6851 Activated protein C resistance: Secondary | ICD-10-CM | POA: Diagnosis not present

## 2015-03-17 DIAGNOSIS — I639 Cerebral infarction, unspecified: Secondary | ICD-10-CM

## 2015-03-17 NOTE — Patient Instructions (Addendum)
Anticoagulation Dose Instructions as of 03/17/2015      Glynis SmilesSun Mon Tue Wed Thu Fri Sat   New Dose 10 mg 10 mg 10 mg 10 mg 10 mg 10 mg 10 mg    Description        Take an extra 1/2 tablet today then resume warfarin at current dose of 2 tablets = 10mg  once daily.     INR was 2.9 today

## 2015-04-28 ENCOUNTER — Encounter: Payer: Self-pay | Admitting: Pharmacist

## 2015-04-28 ENCOUNTER — Ambulatory Visit (INDEPENDENT_AMBULATORY_CARE_PROVIDER_SITE_OTHER): Payer: 59 | Admitting: Pharmacist

## 2015-04-28 DIAGNOSIS — I639 Cerebral infarction, unspecified: Secondary | ICD-10-CM

## 2015-04-28 DIAGNOSIS — D6851 Activated protein C resistance: Secondary | ICD-10-CM

## 2015-04-28 LAB — POCT INR: INR: 3.7

## 2015-04-28 NOTE — Patient Instructions (Signed)
Anticoagulation Dose Instructions as of 04/28/2015      Emily SmilesSun Mon Tue Wed Thu Fri Sat   New Dose 10 mg 10 mg 10 mg 10 mg 10 mg 10 mg 10 mg    Description        No warfarin today - resume warfarin at current dose of 2 tablets = 10mg  once daily. Try to get 2 servings of leafy greens per week.     INR was 3.7 today

## 2015-05-13 ENCOUNTER — Ambulatory Visit (INDEPENDENT_AMBULATORY_CARE_PROVIDER_SITE_OTHER): Payer: 59 | Admitting: Family Medicine

## 2015-05-13 ENCOUNTER — Encounter: Payer: Self-pay | Admitting: Family Medicine

## 2015-05-13 VITALS — BP 130/87 | HR 80 | Temp 97.1°F | Ht 61.0 in | Wt 111.8 lb

## 2015-05-13 DIAGNOSIS — M25441 Effusion, right hand: Secondary | ICD-10-CM | POA: Diagnosis not present

## 2015-05-13 NOTE — Progress Notes (Signed)
   HPI  Patient presents today here for a right fifth digit swelling.  Patient explains that she was cleaning yesterday and afterwards noticed that her right fifth digit with swelling of the PIP. She explains that she had redness and swelling last night, she iced it and that has improved quite a bit today. She has a grandbaby in the NICU in order to be sure that she was not contagious. She's taking warfarin like usual with no missed doses for factor V Leyden.  She denies fever, chills, nausea, vomiting, pain, tenderness, warmth, or difficulty using that finger. She does not remember any inciting events, she also does not remember having anything that was concerning for a bug bite.   PMH: Smoking status noted ROS: Per HPI  Objective: BP 130/87 mmHg  Pulse 80  Temp(Src) 97.1 F (36.2 C) (Oral)  Ht 5\' 1"  (1.549 m)  Wt 111 lb 12.8 oz (50.712 kg)  BMI 21.14 kg/m2 Gen: NAD, alert, cooperative with exam HEENT: NCAT CV: RRR, good S1/S2, no murmur Resp: CTABL, no wheezes, non-labored Ext: Right fifth digit with very slight swelling of the PIP, no tenderness to palpation, no fluctuance, no warmth Neuro: Alert and oriented, No gross deficits  Assessment and plan:  # Right fifth digit swelling Unclear etiology, I think it's most likely that she had a mild injury that she did not recognize while cleaning. Also consider arthropod bite Unlikely that this is cellulitis We did discuss the possibility of superficial thrombosis considering factor V Leyden, however with her being on Coumadin I think this is unlikely as well. Recommended continuing ice, seeking medical attention if she has worsening redness, pain, warmth, or difficulty using that joint   Murtis SinkSam Foster Sonnier, MD [facility] 05/13/2015, 4:49 PM

## 2015-05-17 ENCOUNTER — Encounter: Payer: Self-pay | Admitting: Pharmacist

## 2015-05-19 ENCOUNTER — Ambulatory Visit (INDEPENDENT_AMBULATORY_CARE_PROVIDER_SITE_OTHER): Payer: 59 | Admitting: Pharmacist

## 2015-05-19 DIAGNOSIS — I639 Cerebral infarction, unspecified: Secondary | ICD-10-CM | POA: Diagnosis not present

## 2015-05-19 DIAGNOSIS — D6851 Activated protein C resistance: Secondary | ICD-10-CM

## 2015-05-19 LAB — POCT INR: INR: 3.9

## 2015-05-19 NOTE — Patient Instructions (Signed)
Anticoagulation Dose Instructions as of 05/19/2015      Glynis SmilesSun Mon Tue Wed Thu Fri Sat   New Dose 10 mg 10 mg 10 mg 7.5 mg 10 mg 10 mg 10 mg    Description        No warfarin today - Thursday, December 29th.  - Then decrease dose to 1 and 1/2 on Wednesdays and 2 tablet all other days.   Try to get 2 servings of leafy greens per week.     INR was 3.9 today

## 2015-06-16 ENCOUNTER — Ambulatory Visit (INDEPENDENT_AMBULATORY_CARE_PROVIDER_SITE_OTHER): Payer: 59 | Admitting: Pharmacist

## 2015-06-16 DIAGNOSIS — I639 Cerebral infarction, unspecified: Secondary | ICD-10-CM

## 2015-06-16 DIAGNOSIS — Z23 Encounter for immunization: Secondary | ICD-10-CM | POA: Diagnosis not present

## 2015-06-16 DIAGNOSIS — D6851 Activated protein C resistance: Secondary | ICD-10-CM | POA: Diagnosis not present

## 2015-06-16 LAB — POCT INR: INR: 2.5

## 2015-06-16 NOTE — Patient Instructions (Signed)
Anticoagulation Dose Instructions as of 06/16/2015      [name] Tue Wed Thu Fri Sat   New Dose 10 mg 10 mg 10 mg 7.5 mg 10 mg 10 mg 10 mg    Description        Continue current warfarin dose of 1 and 1/2 on Wednesdays and 2 tablet all other days.   Try to get 2 servings of leafy greens per week.     INR was 2.5 today

## 2015-07-15 ENCOUNTER — Encounter: Payer: Self-pay | Admitting: Family Medicine

## 2015-07-15 ENCOUNTER — Ambulatory Visit (INDEPENDENT_AMBULATORY_CARE_PROVIDER_SITE_OTHER): Payer: 59 | Admitting: Family Medicine

## 2015-07-15 VITALS — BP 141/83 | HR 87 | Temp 101.6°F | Ht 61.0 in | Wt 120.8 lb

## 2015-07-15 DIAGNOSIS — J111 Influenza due to unidentified influenza virus with other respiratory manifestations: Secondary | ICD-10-CM | POA: Diagnosis not present

## 2015-07-15 DIAGNOSIS — R059 Cough, unspecified: Secondary | ICD-10-CM

## 2015-07-15 DIAGNOSIS — R05 Cough: Secondary | ICD-10-CM

## 2015-07-15 LAB — POCT INFLUENZA A/B
INFLUENZA B, POC: NEGATIVE
Influenza A, POC: NEGATIVE

## 2015-07-15 MED ORDER — OSELTAMIVIR PHOSPHATE 75 MG PO CAPS: 75.0000 mg | ORAL_CAPSULE | Freq: Two times a day (BID) | ORAL | Status: DC

## 2015-07-15 NOTE — Progress Notes (Signed)
BP 141/83 mmHg  Pulse 87  Temp(Src) 101.6 F (38.7 C) (Oral)  Ht  (1.549 m)  Wt 120 lb 12.8 oz (54.795 kg)  BMI 22.84 kg/m2   Subjective:    Patient ID: [name], female    DOB: 04/19/1956, 60 y.o.   MRN: [id number]  HPI: [name] is a 60 y.o. female presenting on 07/15/2015 for Cough; Chest congestion; and Sinusitis   HPI Cough and congestion and fevers and chills and aches Patient has been having cough and congestion and fevers and chills and aches for the past day. Last night she had a fever of 100 and today she has a fever 101.6 here in office. She has also had chills and aches today and overnight. She denies any sick contacts that she knows of except her husband has been sick and is here with her today in the office. She has been using Flonase to help with the symptoms. She also has Claritin but has not been using it just yet. She denies any shortness of breath or wheezing. Her cough is productive of yellow-green sputum.  Relevant past medical, surgical, family and social history reviewed and updated as indicated. Interim medical history since our last visit reviewed. Allergies and medications reviewed and updated.  Review of Systems  Constitutional: Positive for fever and chills.  HENT: Positive for congestion, postnasal drip, rhinorrhea, sinus pressure and sore throat. Negative for ear discharge, ear pain and sneezing.   Eyes: Negative for pain, redness and visual disturbance.  Respiratory: Positive for cough. Negative for chest tightness and shortness of breath.   Cardiovascular: Negative for chest pain and leg swelling.  Genitourinary: Negative for dysuria and difficulty urinating.  Musculoskeletal: Positive for myalgias. Negative for back pain and gait problem.  Skin: Negative for rash.  Neurological: Negative for light-headedness and headaches.  Psychiatric/Behavioral: Negative for behavioral problems and agitation.  All other systems reviewed and are  negative.   Per HPI unless specifically indicated above     Medication List       This list is accurate as of: 07/15/15  3:25 PM.  Always use your most recent med list.               ESTRACE VAGINAL 0.1 MG/GM vaginal cream  Generic drug:  estradiol  Apply vaginally as directed     fluticasone 50 MCG/ACT nasal spray  Commonly known as:  FLONASE  Place 2 sprays into the nose daily. Reported on 05/13/2015     Ketoprofen 10 % Crea  Apply to area of pain 1 to 2 times per day.     loratadine 10 MG tablet  Commonly known as:  CLARITIN  Take 1 tablet (10 mg total) by mouth daily.     oseltamivir 75 MG capsule  Commonly known as:  TAMIFLU  Take 1 capsule (75 mg total) by mouth 2 (two) times daily.     VOLTAREN 1 % Gel  Generic drug:  diclofenac sodium  APPLY 1 GRAM TO AFFECTED AREA TWICE DAILY AS NEEDED     warfarin 5 MG tablet  Commonly known as:  COUMADIN  TAKE 1 TO 2 TABLETS DAILY AS DIRECTED           Objective:    BP 141/83 mmHg  Pulse 87  Temp(Src) 101.6 F (38.7 C) (Oral)  Ht  (1.549 m)  Wt 120 lb 12.8 oz (54.795 kg)  BMI 22.84 kg/m2  Wt Readings from Last 3 Encounters:  07/15/15 120 lb 12.8 oz (54.795 kg)  05/13/15 111 lb 12.8 oz (50.712 kg)    Physical Exam  Constitutional: She is oriented to person, place, and time. She appears well-developed and well-nourished. No distress.  HENT:  Right Ear: Tympanic membrane, external ear and ear canal normal.  Left Ear: Tympanic membrane, external ear and ear canal normal.  Nose: Mucosal edema and rhinorrhea present. No epistaxis. Right sinus exhibits no maxillary sinus tenderness and no frontal sinus tenderness. Left sinus exhibits no maxillary sinus tenderness and no frontal sinus tenderness.  Mouth/Throat: Uvula is midline and mucous membranes are normal. Posterior oropharyngeal edema and posterior oropharyngeal erythema present. No oropharyngeal exudate or tonsillar abscesses.  Eyes: Conjunctivae and EOM  are normal.  Neck: Neck supple. No thyromegaly present.  Cardiovascular: Normal rate, regular rhythm, normal heart sounds and intact distal pulses.   No murmur heard. Pulmonary/Chest: Effort normal and breath sounds normal. No respiratory distress. She has no wheezes.  Musculoskeletal: Normal range of motion. She exhibits no edema or tenderness.  Lymphadenopathy:    She has no cervical adenopathy.  Neurological: She is alert and oriented to person, place, and time. Coordination normal.  Skin: Skin is warm and dry. No rash noted. She is not diaphoretic.  Psychiatric: She has a normal mood and affect. Her behavior is normal.  Vitals reviewed.  Results for orders placed or performed in visit on 07/15/15  POCT Influenza A/B  Result Value Ref Range   Influenza A, POC Negative Negative   Influenza B, POC Negative Negative      Assessment & Plan:   Problem List Items Addressed This Visit    None    Visit Diagnoses    Cough    -  Primary    Relevant Medications    oseltamivir (TAMIFLU) 75 MG capsule    Other Relevant Orders    POCT Influenza A/B (Completed)    Influenza with respiratory manifestation        Relevant Medications    oseltamivir (TAMIFLU) 75 MG capsule       Follow up plan: Return if symptoms worsen or fail to improve.  Counseling provided for all of the vaccine components Orders Placed This Encounter  Procedures  . POCT Influenza A/B    Arville Care, MD [facility] 07/15/2015, 3:25 PM

## 2015-07-26 ENCOUNTER — Other Ambulatory Visit: Payer: Self-pay | Admitting: Pharmacist

## 2015-07-27 ENCOUNTER — Other Ambulatory Visit: Payer: Self-pay | Admitting: *Deleted

## 2015-07-27 DIAGNOSIS — Z1211 Encounter for screening for malignant neoplasm of colon: Secondary | ICD-10-CM

## 2015-07-28 ENCOUNTER — Other Ambulatory Visit: Payer: Self-pay | Admitting: Pharmacist

## 2015-07-28 ENCOUNTER — Ambulatory Visit (INDEPENDENT_AMBULATORY_CARE_PROVIDER_SITE_OTHER): Payer: 59 | Admitting: Pharmacist

## 2015-07-28 DIAGNOSIS — D6851 Activated protein C resistance: Secondary | ICD-10-CM

## 2015-07-28 DIAGNOSIS — I639 Cerebral infarction, unspecified: Secondary | ICD-10-CM

## 2015-07-28 LAB — PROTIME-INR: INR: 2 — AB (ref <=1.1)

## 2015-07-28 NOTE — Progress Notes (Signed)
Patient had colonoscopy about 8 years ago but cannot remember office or physician.  Requested old chart to review records.

## 2015-07-28 NOTE — Patient Instructions (Signed)
Anticoagulation Dose Instructions as of 07/28/2015      Emily SmilesSun Mon Tue Wed Thu Fri Sat   New Dose 10 mg 10 mg 10 mg 7.5 mg 10 mg 10 mg 10 mg    Description        Continue current warfarin dose of 1 and 1/2 on Wednesdays and 2 tablet all other days.   Try to get 2 servings of leafy greens per week.     INR was 2.0 today

## 2015-08-05 ENCOUNTER — Encounter: Payer: Self-pay | Admitting: Pharmacist

## 2015-08-31 LAB — COAGUCHEK XS/INR WAIVED
INR: 2 — AB (ref 0.9–1.1)
Prothrombin Time: 24.4 s

## 2015-09-08 ENCOUNTER — Ambulatory Visit (INDEPENDENT_AMBULATORY_CARE_PROVIDER_SITE_OTHER): Payer: 59 | Admitting: Pharmacist

## 2015-09-08 ENCOUNTER — Encounter (INDEPENDENT_AMBULATORY_CARE_PROVIDER_SITE_OTHER): Payer: Self-pay

## 2015-09-08 DIAGNOSIS — D6851 Activated protein C resistance: Secondary | ICD-10-CM | POA: Diagnosis not present

## 2015-09-08 DIAGNOSIS — I639 Cerebral infarction, unspecified: Secondary | ICD-10-CM

## 2015-09-08 LAB — COAGUCHEK XS/INR WAIVED
INR: 2.7 — AB (ref 0.9–1.1)
PROTHROMBIN TIME: 32.7 s

## 2015-09-29 ENCOUNTER — Other Ambulatory Visit: Payer: Self-pay | Admitting: Family Medicine

## 2015-09-30 NOTE — Telephone Encounter (Signed)
Please advise on refill. Last filled in 2015

## 2015-10-20 ENCOUNTER — Encounter: Payer: Self-pay | Admitting: Pharmacist

## 2015-11-03 ENCOUNTER — Ambulatory Visit (INDEPENDENT_AMBULATORY_CARE_PROVIDER_SITE_OTHER): Payer: Managed Care, Other (non HMO) | Admitting: Pharmacist

## 2015-11-03 DIAGNOSIS — D6851 Activated protein C resistance: Secondary | ICD-10-CM | POA: Diagnosis not present

## 2015-11-03 DIAGNOSIS — I639 Cerebral infarction, unspecified: Secondary | ICD-10-CM

## 2015-11-03 LAB — COAGUCHEK XS/INR WAIVED
INR: 2.2 — AB (ref 0.9–1.1)
Prothrombin Time: 26.3 s

## 2015-11-03 NOTE — Patient Instructions (Addendum)
   Anticoagulation Dose Instructions as of 11/03/2015      Emily SmilesSun Mon Tue Wed Thu Fri Sat   New Dose 10 mg 10 mg 10 mg 7.5 mg 10 mg 10 mg 10 mg    Description        Continue current warfarin dose of 1 and 1/2 on Wednesdays and 2 tablet all other days.       INR was 2.2 today

## 2015-12-15 ENCOUNTER — Ambulatory Visit (INDEPENDENT_AMBULATORY_CARE_PROVIDER_SITE_OTHER): Payer: Managed Care, Other (non HMO) | Admitting: Pharmacist

## 2015-12-15 DIAGNOSIS — D6851 Activated protein C resistance: Secondary | ICD-10-CM

## 2015-12-15 DIAGNOSIS — Z8673 Personal history of transient ischemic attack (TIA), and cerebral infarction without residual deficits: Secondary | ICD-10-CM

## 2015-12-15 DIAGNOSIS — I639 Cerebral infarction, unspecified: Secondary | ICD-10-CM

## 2015-12-15 LAB — COAGUCHEK XS/INR WAIVED
INR: 1.9 — ABNORMAL HIGH (ref 0.9–1.1)
PROTHROMBIN TIME: 23 s

## 2015-12-29 DIAGNOSIS — J3089 Other allergic rhinitis: Secondary | ICD-10-CM

## 2015-12-29 DIAGNOSIS — J302 Other seasonal allergic rhinitis: Secondary | ICD-10-CM | POA: Insufficient documentation

## 2016-01-26 ENCOUNTER — Ambulatory Visit (INDEPENDENT_AMBULATORY_CARE_PROVIDER_SITE_OTHER): Payer: Managed Care, Other (non HMO) | Admitting: Pharmacist

## 2016-01-26 DIAGNOSIS — D6851 Activated protein C resistance: Secondary | ICD-10-CM | POA: Diagnosis not present

## 2016-01-26 DIAGNOSIS — Z8673 Personal history of transient ischemic attack (TIA), and cerebral infarction without residual deficits: Secondary | ICD-10-CM

## 2016-01-26 DIAGNOSIS — I639 Cerebral infarction, unspecified: Secondary | ICD-10-CM

## 2016-01-26 LAB — COAGUCHEK XS/INR WAIVED
INR: 1.8 — ABNORMAL HIGH (ref 0.9–1.1)
Prothrombin Time: 21.3 s

## 2016-02-13 ENCOUNTER — Telehealth: Payer: Self-pay | Admitting: Family Medicine

## 2016-02-13 NOTE — Telephone Encounter (Signed)
Patient has question about trying a coffee drink with collegen, cinnamon and cocoa in the monring.  Wanted to make sure it did not cause problems with warfarin . Ok to try - due recheck INR in 1 month.  Patient asked to changed INR recheck from 10/19 to 10/26 due to being out of town.  Appt changed.

## 2016-03-08 ENCOUNTER — Encounter: Payer: Self-pay | Admitting: Pharmacist

## 2016-03-15 ENCOUNTER — Ambulatory Visit (INDEPENDENT_AMBULATORY_CARE_PROVIDER_SITE_OTHER): Payer: Managed Care, Other (non HMO) | Admitting: Pharmacist

## 2016-03-15 DIAGNOSIS — D6851 Activated protein C resistance: Secondary | ICD-10-CM

## 2016-03-15 DIAGNOSIS — Z23 Encounter for immunization: Secondary | ICD-10-CM

## 2016-03-15 DIAGNOSIS — I639 Cerebral infarction, unspecified: Secondary | ICD-10-CM

## 2016-03-15 LAB — COAGUCHEK XS/INR WAIVED
INR: 1.5 — AB (ref 0.9–1.1)
PROTHROMBIN TIME: 18.5 s

## 2016-04-07 ENCOUNTER — Encounter: Payer: Self-pay | Admitting: Family

## 2016-04-07 ENCOUNTER — Ambulatory Visit (INDEPENDENT_AMBULATORY_CARE_PROVIDER_SITE_OTHER): Payer: Managed Care, Other (non HMO) | Admitting: Family

## 2016-04-07 VITALS — BP 133/75 | HR 75 | Temp 97.7°F | Ht 61.0 in | Wt 119.4 lb

## 2016-04-07 DIAGNOSIS — L089 Local infection of the skin and subcutaneous tissue, unspecified: Secondary | ICD-10-CM

## 2016-04-07 MED ORDER — CEPHALEXIN 500 MG PO CAPS: 500.0000 mg | ORAL_CAPSULE | Freq: Three times a day (TID) | ORAL | 0 refills | Status: DC

## 2016-04-07 NOTE — Progress Notes (Signed)
   Subjective:    Patient ID: [name], female    DOB: 09/13/1955, 60 y.o.   MRN: [id number]  HPI PT presents to the office today with lesion in between her right pinky toe that started after getting new tennis shoes. PT states she saw her Ortho doctor two months ago and was given rx antibiotic. PT reports it went away. PT states it has returned. PT denies showering at the gym, but has been wearing the new tennis shoes again over the last two weeks.    Review of Systems  Skin: Positive for wound.  All other systems reviewed and are negative.      Objective:   Physical Exam  Constitutional: She is oriented to person, place, and time. She appears well-developed and well-nourished.  Cardiovascular: Normal rate, regular rhythm, normal heart sounds and intact distal pulses.   Pulmonary/Chest: Effort normal and breath sounds normal.  Musculoskeletal: Normal range of motion.  Neurological: She is alert and oriented to person, place, and time.  Skin: Skin is dry. Lesion (right pinky toe small erythem lesion, no discharge ) noted.  Psychiatric: She has a normal mood and affect. Her behavior is normal. Judgment and thought content normal.    BP 133/75   Pulse 75   Temp 97.7 F (36.5 C) (Oral)   Ht 5\' 1"  (1.549 m)   Wt 119 lb 6.4 oz (54.2 kg)   BMI 22.56 kg/m        Assessment & Plan:  1. Infection of skin of toes -Keep clean and dry -If wear the new tennis shoes, place padding and gauze around pinky to protect from re-injurty -RTO prn  - cephALEXin (KEFLEX) 500 MG capsule; Take 1 capsule (500 mg total) by mouth 3 (three) times daily.  Dispense: 21 capsule; Refill: 0  Jannifer Rodneyhristy Reilly Molchan, FNP

## 2016-04-07 NOTE — Patient Instructions (Signed)
Keep clean and dry

## 2016-04-26 ENCOUNTER — Ambulatory Visit (INDEPENDENT_AMBULATORY_CARE_PROVIDER_SITE_OTHER): Payer: Managed Care, Other (non HMO) | Admitting: Pharmacist

## 2016-04-26 DIAGNOSIS — I6930 Unspecified sequelae of cerebral infarction: Secondary | ICD-10-CM | POA: Diagnosis not present

## 2016-04-26 DIAGNOSIS — D6851 Activated protein C resistance: Secondary | ICD-10-CM | POA: Diagnosis not present

## 2016-04-26 DIAGNOSIS — I639 Cerebral infarction, unspecified: Secondary | ICD-10-CM

## 2016-04-26 LAB — COAGUCHEK XS/INR WAIVED
INR: 2.9 — AB (ref 0.9–1.1)
Prothrombin Time: 35 s

## 2016-04-30 ENCOUNTER — Telehealth: Payer: Self-pay | Admitting: Pharmacist

## 2016-05-01 NOTE — Telephone Encounter (Signed)
OK to take melatonin with warfarin.

## 2016-06-07 ENCOUNTER — Encounter: Payer: Self-pay | Admitting: Pharmacist

## 2016-06-14 ENCOUNTER — Encounter: Payer: Self-pay | Admitting: Pharmacist

## 2016-06-21 ENCOUNTER — Ambulatory Visit (INDEPENDENT_AMBULATORY_CARE_PROVIDER_SITE_OTHER): Payer: 59 | Admitting: Pharmacist

## 2016-06-21 DIAGNOSIS — D6851 Activated protein C resistance: Secondary | ICD-10-CM

## 2016-06-21 DIAGNOSIS — I639 Cerebral infarction, unspecified: Secondary | ICD-10-CM

## 2016-06-21 DIAGNOSIS — I6930 Unspecified sequelae of cerebral infarction: Secondary | ICD-10-CM

## 2016-06-21 LAB — COAGUCHEK XS/INR WAIVED
INR: 3.4 — ABNORMAL HIGH (ref 0.9–1.1)
Prothrombin Time: 41.2 s

## 2016-06-22 ENCOUNTER — Other Ambulatory Visit: Payer: Self-pay | Admitting: Family Medicine

## 2016-07-18 NOTE — Addendum Note (Signed)
Addended by: Henrene PastorECKARD, [place] on: 07/18/2016 10:16 AM   Modules accepted: Level of Service

## 2016-07-26 ENCOUNTER — Ambulatory Visit (INDEPENDENT_AMBULATORY_CARE_PROVIDER_SITE_OTHER): Payer: 59 | Admitting: Pharmacist

## 2016-07-26 DIAGNOSIS — D6851 Activated protein C resistance: Secondary | ICD-10-CM | POA: Diagnosis not present

## 2016-07-26 DIAGNOSIS — I6930 Unspecified sequelae of cerebral infarction: Secondary | ICD-10-CM

## 2016-07-26 DIAGNOSIS — I639 Cerebral infarction, unspecified: Secondary | ICD-10-CM

## 2016-07-26 LAB — COAGUCHEK XS/INR WAIVED
INR: 2.3 — AB (ref 0.9–1.1)
PROTHROMBIN TIME: 27.4 s

## 2016-08-23 ENCOUNTER — Other Ambulatory Visit: Payer: Self-pay | Admitting: Family

## 2016-08-23 DIAGNOSIS — L089 Local infection of the skin and subcutaneous tissue, unspecified: Secondary | ICD-10-CM

## 2016-08-25 ENCOUNTER — Telehealth: Payer: Self-pay | Admitting: Family

## 2016-08-25 ENCOUNTER — Encounter: Payer: Self-pay | Admitting: Pediatrics

## 2016-08-25 ENCOUNTER — Ambulatory Visit (INDEPENDENT_AMBULATORY_CARE_PROVIDER_SITE_OTHER): Payer: 59 | Admitting: Pediatrics

## 2016-08-25 VITALS — BP 141/78 | HR 71 | Temp 98.3°F | Ht 61.0 in | Wt 119.0 lb

## 2016-08-25 DIAGNOSIS — L089 Local infection of the skin and subcutaneous tissue, unspecified: Secondary | ICD-10-CM | POA: Diagnosis not present

## 2016-08-25 MED ORDER — MUPIROCIN 2 % EX OINT: TOPICAL_OINTMENT | CUTANEOUS | 0 refills | Status: DC

## 2016-08-25 NOTE — Progress Notes (Signed)
  Subjective:   Patient ID: [name], female    DOB: [birth year], 61 y.o.   MRN: [id number] CC: sore on toe  HPI: [name] is a 61 y.o. female presenting for sore on toe  Sore area between 5th and 4th toes R foot that comes and goes Not sore now White patch in area Was given keflex the last time it was red, redness resolved Has tried topical atheletes foot cream as well No itching in area No other symptoms No fevers  Relevant past medical, surgical, family and social history reviewed. Allergies and medications reviewed and updated. History  Smoking Status  . Never Smoker  Smokeless Tobacco  . Never Used   ROS: Per HPI   Objective:    BP (!) 141/78   Pulse 71   Temp 98.3 F (36.8 C) (Oral)   Ht  (1.549 m)   Wt 119 lb (54 kg)   BMI 22.48 kg/m   Wt Readings from Last 3 Encounters:  08/25/16 119 lb (54 kg)  04/07/16 119 lb 6.4 oz (54.2 kg)  07/15/15 120 lb 12.8 oz (54.8 kg)    Gen: NAD, alert, cooperative with exam, NCAT EYES: EOMI, no conjunctival injection, or no icterus ENT:  TMs pearly gray b/l, OP without erythema LYMPH: no cervical LAD CV:WWP Resp: normal WOB Ext: No edema, warm Neuro: Alert and oriented Skin:  between 4th and 5th toes with small irregular white area in skin folds between toes Two apprx 1mm breaks in outer epidermis above white area No soreness, no tenderness Calluses present bottom of feet b/l  Assessment & Plan:  [name] was seen today for sore on toe.  Diagnoses and all orders for this visit:  Infection of skin of toes No redness or soreness today Pt concerned is going to come back Improved in the past with PO abx better than with topical antifungal Difficult place to keep dry Wash with soap and water regularly, can keep small amount of cotton from cotton ball between toe if needed If starts to get sore let us know, can try mupirocin cream  Other orders -     mupirocin ointment (BACTROBAN) 2 %; Apply to affected area  TID   Follow up plan: prn Rex Kras, MD [place] [facility]

## 2016-08-25 NOTE — Telephone Encounter (Signed)
What is the name of the medication?keflex  [name] prescribed in 04/2017 and she is having same toe problem wants to know if she can get refill  Have you contacted your pharmacy to request a refill? Yes   Which pharmacy would you like this sent to? [facility], pt states Judie Grieve has called office to have it refilled and has not heard back    Patient notified that their request is being sent to the clinical staff for review and that they should receive a call once it is complete. If they do not receive a call within 24 hours they can check with their pharmacy or our office.

## 2016-08-27 NOTE — Telephone Encounter (Signed)
Patient was seen by Dr. Oswaldo Done on 08/25/16 and was given mupirocin ointment for infected toe.  This encounter will now be closed

## 2016-09-06 ENCOUNTER — Ambulatory Visit (INDEPENDENT_AMBULATORY_CARE_PROVIDER_SITE_OTHER): Payer: 59 | Admitting: Pharmacist

## 2016-09-06 DIAGNOSIS — D6851 Activated protein C resistance: Secondary | ICD-10-CM | POA: Diagnosis not present

## 2016-09-06 LAB — COAGUCHEK XS/INR WAIVED
INR: 2.1 — AB (ref 0.9–1.1)
PROTHROMBIN TIME: 24.8 s

## 2016-09-17 ENCOUNTER — Other Ambulatory Visit: Payer: Self-pay | Admitting: Family Medicine

## 2016-10-18 ENCOUNTER — Ambulatory Visit (INDEPENDENT_AMBULATORY_CARE_PROVIDER_SITE_OTHER): Payer: 59 | Admitting: Pharmacist

## 2016-10-18 DIAGNOSIS — D6851 Activated protein C resistance: Secondary | ICD-10-CM

## 2016-10-18 LAB — COAGUCHEK XS/INR WAIVED
INR: 1.7 — ABNORMAL HIGH (ref 0.9–1.1)
Prothrombin Time: 20.1 s

## 2016-11-29 ENCOUNTER — Ambulatory Visit (INDEPENDENT_AMBULATORY_CARE_PROVIDER_SITE_OTHER): Payer: 59 | Admitting: Pharmacist

## 2016-11-29 DIAGNOSIS — D6851 Activated protein C resistance: Secondary | ICD-10-CM | POA: Diagnosis not present

## 2016-11-29 LAB — COAGUCHEK XS/INR WAIVED
INR: 1.9 — AB (ref 0.9–1.1)
Prothrombin Time: 22.6 s

## 2016-11-29 NOTE — Patient Instructions (Signed)
Anticoagulation Warfarin Dose Instructions as of 11/29/2016      Emily SmilesSun Mon Tue Wed Thu Fri Sat   New Dose 10 mg 10 mg 10 mg 10 mg 10 mg 10 mg 10 mg    Description   Take an extra 1/2 tablet today - Thursday, July 12th.  Then continue current warfarin 5mg  tablets - take 2 tablets once daily  INR was 1.9 today

## 2016-12-03 ENCOUNTER — Telehealth: Payer: Self-pay | Admitting: Family Medicine

## 2016-12-04 ENCOUNTER — Telehealth: Payer: Self-pay | Admitting: Family

## 2016-12-04 MED ORDER — FLUCONAZOLE 150 MG PO TABS: 150.0000 mg | ORAL_TABLET | ORAL | 0 refills | Status: DC

## 2016-12-04 NOTE — Telephone Encounter (Signed)
Pt notified of appt Would like to speak with Tammy Please call pt

## 2016-12-04 NOTE — Telephone Encounter (Signed)
Diflucan Prescription sent to pharmacy, this could cause INR to be abnormal. Will need to follow up.

## 2016-12-04 NOTE — Telephone Encounter (Signed)
Just mistake - changed to 6 weeks.  New appt 01/10/17 at 3:45pm

## 2016-12-04 NOTE — Telephone Encounter (Signed)
Patient concerned about interaction between fluconazole and warfarin.  Also [facility]  Explained that all antifungals can affect INR / warfarin .  Will need to recheck INR sooner.  Rx resent to patient's pharmacy.

## 2016-12-04 NOTE — Addendum Note (Signed)
Addended by: Henrene PastorECKARD, Gio Janoski B on: 12/04/2016 01:50 PM   Modules accepted: Level of Service

## 2016-12-04 NOTE — Telephone Encounter (Signed)
Neysa BonitoChristy can you do this - you seen her last for this problem.

## 2016-12-17 ENCOUNTER — Telehealth: Payer: Self-pay | Admitting: Family Medicine

## 2016-12-17 MED ORDER — KETOPROFEN 10 % CREA: TOPICAL_CREAM | 0 refills | Status: DC

## 2016-12-17 NOTE — Telephone Encounter (Signed)
Patient states that toe is only minimally better since starting fluconazole.  Has had 2 of 4 doses so far.  I recommended she continue with treatment.  She already has appt with dermatologist this Friday August 3rd.  Recommended she continue with plan.  Take fluconazole this Wednesday and if not better then see dermatologist Friday.  Patient also requested refill on ketoprofen for foot pain.  Rx sent in.

## 2016-12-19 ENCOUNTER — Other Ambulatory Visit: Payer: Self-pay | Admitting: Family Medicine

## 2016-12-20 ENCOUNTER — Ambulatory Visit: Payer: Self-pay | Admitting: Pharmacist

## 2016-12-20 ENCOUNTER — Ambulatory Visit (INDEPENDENT_AMBULATORY_CARE_PROVIDER_SITE_OTHER): Payer: BLUE CROSS/BLUE SHIELD | Admitting: Pharmacist

## 2016-12-20 DIAGNOSIS — D6851 Activated protein C resistance: Secondary | ICD-10-CM | POA: Diagnosis not present

## 2016-12-20 LAB — COAGUCHEK XS/INR WAIVED
INR: 2.2 — AB (ref 0.9–1.1)
PROTHROMBIN TIME: 26.7 s

## 2016-12-20 NOTE — Patient Instructions (Signed)
Anticoagulation Warfarin Dose Instructions as of 12/20/2016      Emily SmilesSun Mon Tue Wed Thu Fri Sat   New Dose 10 mg 10 mg 10 mg 10 mg 10 mg 10 mg 10 mg    Description   Take an extra 1/2 tablet today - Thursday, July 12th.  Then continue current warfarin 5mg  tablets - take 2 tablets once daily  INR was 2.2 today

## 2016-12-21 DIAGNOSIS — X32XXXD Exposure to sunlight, subsequent encounter: Secondary | ICD-10-CM | POA: Diagnosis not present

## 2016-12-21 DIAGNOSIS — L570 Actinic keratosis: Secondary | ICD-10-CM | POA: Diagnosis not present

## 2016-12-21 DIAGNOSIS — L84 Corns and callosities: Secondary | ICD-10-CM | POA: Diagnosis not present

## 2016-12-21 DIAGNOSIS — B353 Tinea pedis: Secondary | ICD-10-CM | POA: Diagnosis not present

## 2017-01-02 ENCOUNTER — Telehealth: Payer: Self-pay | Admitting: Family Medicine

## 2017-01-03 ENCOUNTER — Encounter: Payer: Self-pay | Admitting: Pharmacist

## 2017-01-03 DIAGNOSIS — M2042 Other hammer toe(s) (acquired), left foot: Secondary | ICD-10-CM | POA: Diagnosis not present

## 2017-01-03 DIAGNOSIS — M2041 Other hammer toe(s) (acquired), right foot: Secondary | ICD-10-CM | POA: Diagnosis not present

## 2017-01-03 NOTE — Telephone Encounter (Signed)
Patient called - had questions about anticoagulation clinic changes.   Answered questions and patient will follow up as planned with Chari ManningMichelle Bozovich 02/01/2017

## 2017-01-10 ENCOUNTER — Encounter: Payer: Self-pay | Admitting: Pharmacist

## 2017-02-01 ENCOUNTER — Ambulatory Visit (INDEPENDENT_AMBULATORY_CARE_PROVIDER_SITE_OTHER): Payer: BLUE CROSS/BLUE SHIELD | Admitting: Pharmacist Clinician (PhC)/ Clinical Pharmacy Specialist

## 2017-02-01 DIAGNOSIS — D6851 Activated protein C resistance: Secondary | ICD-10-CM | POA: Diagnosis not present

## 2017-02-01 LAB — COAGUCHEK XS/INR WAIVED
INR: 1.8 — AB (ref 0.9–1.1)
Prothrombin Time: 21.5 s

## 2017-02-01 NOTE — Patient Instructions (Signed)
Anticoagulation Warfarin Dose Instructions as of 02/01/2017      Glynis Smiles Tue Wed Thu Fri Sat   New Dose 10 mg 10 mg 10 mg 10 mg 10 mg 10 mg 10 mg    Description   Take an extra 1/2 tablet today then resume regular schedule.    INR was 1.8

## 2017-03-05 ENCOUNTER — Ambulatory Visit (INDEPENDENT_AMBULATORY_CARE_PROVIDER_SITE_OTHER): Payer: BLUE CROSS/BLUE SHIELD | Admitting: Physician Assistant

## 2017-03-05 ENCOUNTER — Encounter: Payer: Self-pay | Admitting: Physician Assistant

## 2017-03-05 ENCOUNTER — Telehealth: Payer: Self-pay | Admitting: Physician Assistant

## 2017-03-05 VITALS — BP 151/92 | HR 78 | Temp 97.9°F | Ht 61.0 in | Wt 117.0 lb

## 2017-03-05 DIAGNOSIS — Z23 Encounter for immunization: Secondary | ICD-10-CM | POA: Diagnosis not present

## 2017-03-05 DIAGNOSIS — R197 Diarrhea, unspecified: Secondary | ICD-10-CM

## 2017-03-05 NOTE — Patient Instructions (Signed)
Imodium AD 

## 2017-03-05 NOTE — Progress Notes (Signed)
BP (!) 151/92   Pulse 78   Temp 97.9 F (36.6 C) (Oral)   Ht '5\' 1"'$  (1.549 m)   Wt 117 lb (53.1 kg)   BMI 22.11 kg/m    Subjective:    Patient ID: [name], female    DOB: 09-20-1955, 61 y.o.   MRN: [id number]  HPI: [name] is a 61 y.o. female presenting on 03/05/2017 for Diarrhea (Started Sunday)  2 days ago the patient had what she thought was a bad cucumber. She cut off 1 and that looked bad. She cut from the other end and 8 illness salad. Her husband ate the same food that she did that night except for the cucumber. The next day she began with severe diarrhea and cramping. She denies any fever or chills. She denies any blood in the stool.  Relevant past medical, surgical, family and social history reviewed and updated as indicated. Allergies and medications reviewed and updated.  History reviewed. No pertinent past medical history.  History reviewed. No pertinent surgical history.  Review of Systems  Constitutional: Negative.  Negative for activity change, fatigue and fever.  HENT: Negative.   Eyes: Negative.   Respiratory: Negative.  Negative for cough.   Cardiovascular: Negative.  Negative for chest pain.  Gastrointestinal: Positive for abdominal pain and diarrhea. Negative for abdominal distention, blood in stool, nausea and vomiting.  Endocrine: Negative.   Genitourinary: Negative.  Negative for dysuria.  Musculoskeletal: Negative.   Skin: Negative.   Neurological: Negative.     Allergies as of 03/05/2017      Reactions   Codeine    Donnatal [belladonna Alk-phenobarb Er]    Librium       Medication List       Accurate as of 03/05/17  8:58 AM. Always use your most recent med list.          diclofenac sodium 1 % Gel Commonly known as:  VOLTAREN APPLY 1 GRAM TO AFFECTED AREA TWICE DAILY AS NEEDED   ESTRACE VAGINAL 0.1 MG/GM vaginal cream Generic drug:  estradiol Apply vaginally as directed   fluticasone 50 MCG/ACT nasal spray Commonly  known as:  FLONASE Place 2 sprays into the nose daily. Reported on 05/13/2015   Ketoprofen 10 % Crea Apply to area of pain 1 to 2 times per day.   loratadine 10 MG tablet Commonly known as:  CLARITIN Take 1 tablet (10 mg total) by mouth daily.   mupirocin ointment 2 % Commonly known as:  BACTROBAN Apply to affected area TID   warfarin 5 MG tablet Commonly known as:  COUMADIN TAKE 1 TO 2 TABLETS DAILY AS DIRECTED          Objective:    BP (!) 151/92   Pulse 78   Temp 97.9 F (36.6 C) (Oral)   Ht '5\' 1"'$  (1.549 m)   Wt 117 lb (53.1 kg)   BMI 22.11 kg/m   Allergies  Allergen Reactions  . Codeine   . Donnatal [Belladonna Alk-Phenobarb Er]   . Librium     Physical Exam  Constitutional: She is oriented to person, place, and time. She appears well-developed and well-nourished.  HENT:  Head: Normocephalic and atraumatic.  Eyes: Pupils are equal, round, and reactive to light. Conjunctivae and EOM are normal.  Cardiovascular: Normal rate, regular rhythm, normal heart sounds and intact distal pulses.   Pulmonary/Chest: Effort normal and breath sounds normal.  Abdominal: Soft. She exhibits distension. She exhibits no shifting dullness. Bowel  sounds are increased. There is no tenderness. There is no rebound.  Neurological: She is alert and oriented to person, place, and time. She has normal reflexes.  Skin: Skin is warm and dry. No rash noted.  Psychiatric: She has a normal mood and affect. Her behavior is normal. Judgment and thought content normal.    Results for orders placed or performed in visit on 02/01/17  CoaguChek XS/INR Waived  Result Value Ref Range   INR 1.8 (H) 0.9 - 1.1   Prothrombin Time 21.5 sec      Assessment & Plan:   1. Diarrhea, unspecified type Imodium right ear over-the-counter 1-6 times a day Brat diet After 2 days may reintroduce full diet  2. Need for immunization against influenza - Flu Vaccine QUAD 36+ mos IM    Current Outpatient  Prescriptions:  .  diclofenac sodium (VOLTAREN) 1 % GEL, APPLY 1 GRAM TO AFFECTED AREA TWICE DAILY AS NEEDED, Disp: 100 g, Rfl: 2 .  ESTRACE VAGINAL 0.1 MG/GM vaginal cream, Apply vaginally as directed, Disp: , Rfl:  .  fluticasone (FLONASE) 50 MCG/ACT nasal spray, Place 2 sprays into the nose daily. Reported on 05/13/2015, Disp: , Rfl:  .  Ketoprofen 10 % CREA, Apply to area of pain 1 to 2 times per day., Disp: 60 g, Rfl: 0 .  loratadine (CLARITIN) 10 MG tablet, Take 1 tablet (10 mg total) by mouth daily., Disp: 30 tablet, Rfl: 1 .  mupirocin ointment (BACTROBAN) 2 %, Apply to affected area TID, Disp: 22 g, Rfl: 0 .  warfarin (COUMADIN) 5 MG tablet, TAKE 1 TO 2 TABLETS DAILY AS DIRECTED, Disp: 180 tablet, Rfl: 0 Continue all other maintenance medications as listed above.  Follow up plan: Return if symptoms worsen or fail to improve.  Educational handout given for [facility] [place] [address]  [address], [place] [address] [phone]   03/05/2017, 8:58 AM

## 2017-03-05 NOTE — Telephone Encounter (Signed)
Pt instructed to only take Imodium if has loose stools Pt verbalizes understanding

## 2017-03-14 ENCOUNTER — Ambulatory Visit (INDEPENDENT_AMBULATORY_CARE_PROVIDER_SITE_OTHER): Payer: BLUE CROSS/BLUE SHIELD | Admitting: *Deleted

## 2017-03-14 DIAGNOSIS — D6851 Activated protein C resistance: Secondary | ICD-10-CM

## 2017-03-14 LAB — COAGUCHEK XS/INR WAIVED
INR: 1.9 — ABNORMAL HIGH (ref 0.9–1.1)
PROTHROMBIN TIME: 23.2 s

## 2017-03-14 NOTE — Patient Instructions (Signed)
Anticoagulation Warfarin Dose Instructions as of 03/14/2017      [name] Mon Tue Wed Thu Fri Sat   New Dose 10 mg 10 mg 10 mg 10 mg 15 mg 10 mg 10 mg    Description   Take 3 tablets on Thursdays and 2 tablets all other days.   Your INR was 1.9 today Your goal is 2.0 to 3.0  Return in 6 weeks

## 2017-03-14 NOTE — Progress Notes (Signed)
Subjective:     Indication: CVA and Factor V Bleeding signs/symptoms: None Thromboembolic signs/symptoms: None  Missed Coumadin doses: None Medication changes: no Dietary changes: yes - Greens twice last week Bacterial/viral infection: no Other concerns: no  The following portions of the patient's history were reviewed and updated as appropriate: allergies and current medications.  Review of Systems Pertinent items are noted in HPI.   Objective:    INR Today: 1.9 Current dose: 10mg  daily    Assessment:    Subtherapeutic INR for goal of 2-3   Plan:    1. New dose: 15 mg on Thursdays and 10mg  all other days   2. Next INR: 6 weeks  Discussed with Dr Ermalinda MemosBradshaw  Demetrios LollKristen Aaralynn Shepheard, RN

## 2017-03-15 ENCOUNTER — Other Ambulatory Visit: Payer: Self-pay | Admitting: Family Medicine

## 2017-04-25 ENCOUNTER — Ambulatory Visit: Payer: BLUE CROSS/BLUE SHIELD | Admitting: *Deleted

## 2017-04-25 DIAGNOSIS — D6851 Activated protein C resistance: Secondary | ICD-10-CM | POA: Diagnosis not present

## 2017-04-25 LAB — COAGUCHEK XS/INR WAIVED
INR: 1.8 — AB (ref 0.9–1.1)
PROTHROMBIN TIME: 21.2 s

## 2017-04-26 NOTE — Progress Notes (Signed)
Subjective:     Indication: factor V Leiden deficiency Bleeding signs/symptoms: None Thromboembolic signs/symptoms: None  Missed Coumadin doses: None Medication changes: no Dietary changes: no Bacterial/viral infection: no Other concerns: Patient had more greens this week than usual and she is due to take the 15 mg dose tonight  The following portions of the patient's history were reviewed and updated as appropriate: allergies and current medications.  Review of Systems Pertinent items are noted in HPI.   Objective:    INR Today: 1.8 Current dose: 15mg  on Thurs and 10mg  all other days  Assessment:    Subtherapeutic INR for goal of 2-3   Plan:    1. New dose: no change   2. Next INR: 6 weeks   Discussed with Dr Salome ArntBradshaw  Emily Bethune, RN                 04/25/17

## 2017-06-13 ENCOUNTER — Other Ambulatory Visit: Payer: Self-pay | Admitting: *Deleted

## 2017-06-13 ENCOUNTER — Ambulatory Visit: Payer: BLUE CROSS/BLUE SHIELD | Admitting: *Deleted

## 2017-06-13 DIAGNOSIS — D6851 Activated protein C resistance: Secondary | ICD-10-CM | POA: Diagnosis not present

## 2017-06-13 LAB — COAGUCHEK XS/INR WAIVED
INR: 1.7 — AB (ref 0.9–1.1)
PROTHROMBIN TIME: 21 s

## 2017-06-13 NOTE — Patient Instructions (Signed)
Description   Take 3 tablets on Monday and  Thursdays and 2 tablets all other days.   Your INR was 1.7 today Your goal is 2.0 to 3.0  Return in 4 weeks

## 2017-06-13 NOTE — Progress Notes (Signed)
Subjective:     Indication: factor V Leiden deficiency, CVA  Bleeding signs/symptoms: None Thromboembolic signs/symptoms: None  Missed Coumadin doses: None Medication changes: no Dietary changes: no Bacterial/viral infection: no Other concerns: no      Objective:    INR Today: 1.7 Current dose: 10mg  daily except 15mg  on Thursdays   Assessment:    Subtherapeutic INR for goal of 2-3   Plan:    1. New dose: 15mg  on Mondays and Thursdays and 10mg  on all other days    2. Next INR: 1 month

## 2017-06-13 NOTE — Progress Notes (Signed)
Patient coming in today for INR. Order placed.

## 2017-06-24 ENCOUNTER — Telehealth: Payer: Self-pay | Admitting: Family Medicine

## 2017-06-24 ENCOUNTER — Telehealth: Payer: Self-pay | Admitting: *Deleted

## 2017-06-24 NOTE — Telephone Encounter (Signed)
Aware. 

## 2017-06-24 NOTE — Telephone Encounter (Signed)
Please advise on blood thinner and black cohash compatibility?

## 2017-06-24 NOTE — Telephone Encounter (Signed)
Patient wants to be sure you think the black cohash will not cause any problems with her blood thinner?

## 2017-06-24 NOTE — Telephone Encounter (Signed)
OTC Black Kohosh tea is a good thing to try for post menopausal symptoms, There are a few prescription things if she needs to follow up for it.    Murtis SinkSam Lerry Cordrey, MD [facility] 06/24/2017, 1:44 PM

## 2017-06-24 NOTE — Telephone Encounter (Signed)
There should not be an interaction between black cohosh and coumadin.   Murtis SinkSam Bradshaw, MD [facility] 06/24/2017, 5:11 PM

## 2017-06-24 NOTE — Telephone Encounter (Signed)
Pt.notified

## 2017-07-11 ENCOUNTER — Encounter: Payer: Self-pay | Admitting: Family Medicine

## 2017-07-11 ENCOUNTER — Ambulatory Visit: Payer: BLUE CROSS/BLUE SHIELD | Admitting: Family Medicine

## 2017-07-11 VITALS — BP 142/79 | HR 57 | Temp 97.5°F | Ht 61.0 in | Wt 126.2 lb

## 2017-07-11 DIAGNOSIS — D6851 Activated protein C resistance: Secondary | ICD-10-CM

## 2017-07-11 LAB — COAGUCHEK XS/INR WAIVED
INR: 3.3 — ABNORMAL HIGH (ref 0.9–1.1)
Prothrombin Time: 39.2 s

## 2017-07-11 NOTE — Progress Notes (Signed)
   HPI  Patient presents today here for INR check.  Patient has history of factor V Leyden and has been anticoagulated for over 10 years.  She states that she previously was doing very well with 10 mg of Coumadin daily for years.  She then became slightly thick with INR is averaging around 1.7-1.8 and she was adjusted up, eventually to 80 mg a week.  Currently she denies any bleeding.  She has not changed her diet. She has very good medication compliance.  PMH: Smoking status noted ROS: Per HPI  Objective: BP (!) 142/79   Pulse (!) 57   Temp (!) 97.5 F (36.4 C) (Oral)   Ht 5\' 1"  (1.549 m)   Wt 126 lb 3.2 oz (57.2 kg)   BMI 23.85 kg/m  Gen: NAD, alert, cooperative with exam HEENT: NCAT CV: RRR, good S1/S2, no murmur Resp: CTABL, no wheezes, non-labored Ext: No edema, warm Neuro: Alert and oriented, No gross deficits  Assessment and plan:  #Factor V Leyden mutation, long-term Coumadin anticoagulation Slightly hyper therapeutic today Current dose is 80 mg a week, reduce to 75 mg a week Recheck in 6 weeks    Orders Placed This Encounter  Procedures  . CoaguChek XS/INR Ellender HoseWaived     Sam Percy Winterrowd, MD [facility] 07/11/2017, 3:42 PM

## 2017-07-18 DIAGNOSIS — M9903 Segmental and somatic dysfunction of lumbar region: Secondary | ICD-10-CM | POA: Diagnosis not present

## 2017-07-18 DIAGNOSIS — M9902 Segmental and somatic dysfunction of thoracic region: Secondary | ICD-10-CM | POA: Diagnosis not present

## 2017-07-18 DIAGNOSIS — M62830 Muscle spasm of back: Secondary | ICD-10-CM | POA: Diagnosis not present

## 2017-07-18 DIAGNOSIS — M9904 Segmental and somatic dysfunction of sacral region: Secondary | ICD-10-CM | POA: Diagnosis not present

## 2017-07-22 ENCOUNTER — Telehealth: Payer: Self-pay | Admitting: Family Medicine

## 2017-07-22 DIAGNOSIS — M9903 Segmental and somatic dysfunction of lumbar region: Secondary | ICD-10-CM | POA: Diagnosis not present

## 2017-07-22 DIAGNOSIS — M9902 Segmental and somatic dysfunction of thoracic region: Secondary | ICD-10-CM | POA: Diagnosis not present

## 2017-07-22 DIAGNOSIS — M62830 Muscle spasm of back: Secondary | ICD-10-CM | POA: Diagnosis not present

## 2017-07-22 DIAGNOSIS — M9904 Segmental and somatic dysfunction of sacral region: Secondary | ICD-10-CM | POA: Diagnosis not present

## 2017-07-22 NOTE — Telephone Encounter (Signed)
I am not aware of any contraindications to E-Stim with concomitantly using Coumadin. I do not see any reason to limit the use.  Would recommend discussing with Dr. Chase PicketLineberry ( applying Stim).    Murtis SinkSam Sloane Junkin, MD [facility] 07/22/2017, 4:29 PM

## 2017-07-22 NOTE — Telephone Encounter (Signed)
Please review and advise.

## 2017-07-22 NOTE — Telephone Encounter (Signed)
Pt aware.

## 2017-08-16 DIAGNOSIS — X32XXXD Exposure to sunlight, subsequent encounter: Secondary | ICD-10-CM | POA: Diagnosis not present

## 2017-08-16 DIAGNOSIS — L570 Actinic keratosis: Secondary | ICD-10-CM | POA: Diagnosis not present

## 2017-08-16 DIAGNOSIS — L821 Other seborrheic keratosis: Secondary | ICD-10-CM | POA: Diagnosis not present

## 2017-08-22 ENCOUNTER — Ambulatory Visit: Payer: BLUE CROSS/BLUE SHIELD | Admitting: Family Medicine

## 2017-08-25 ENCOUNTER — Other Ambulatory Visit: Payer: Self-pay | Admitting: Family Medicine

## 2017-08-29 ENCOUNTER — Ambulatory Visit: Payer: BLUE CROSS/BLUE SHIELD | Admitting: Family Medicine

## 2017-08-29 ENCOUNTER — Encounter: Payer: Self-pay | Admitting: Family Medicine

## 2017-08-29 VITALS — BP 134/80 | HR 71 | Temp 97.4°F | Ht 61.0 in | Wt 121.8 lb

## 2017-08-29 DIAGNOSIS — D6851 Activated protein C resistance: Secondary | ICD-10-CM

## 2017-08-29 LAB — COAGUCHEK XS/INR WAIVED
INR: 2.2 — AB (ref 0.9–1.1)
PROTHROMBIN TIME: 26.4 s

## 2017-08-29 NOTE — Progress Notes (Signed)
   HPI  Patient presents today here for factor V Leyden chronic anticoagulation follow-up.  Patient had a slight dose reduction last visit. Denies bleeding Denies any change in diet.  Patient gets her annual physical and blood work from her GYN, has a physical coming up next week.  PMH: Smoking status noted ROS: Per HPI  Objective: BP 134/80   Pulse 71   Temp (!) 97.4 F (36.3 C) (Oral)   Ht 5\' 1"  (1.549 m)   Wt 121 lb 12.8 oz (55.2 kg)   BMI 23.01 kg/m  Gen: NAD, alert, cooperative with exam HEENT: NCAT Neuro: Alert and oriented, No gross deficits  Assessment and plan:  #Factor V Leyden, chronic anticoagulation INR is therapeutic today at 2.2. Continue current dose of 75 mg of Coumadin per week, 10 mg daily except for Thursday when she takes 15 mg. Follow-up 6 weeks   Orders Placed This Encounter  Procedures  . CoaguChek XS/INR Ellender HoseWaived    Sam Lila Lufkin, MD [facility] 08/29/2017, 3:47 PM

## 2017-09-05 DIAGNOSIS — Z1231 Encounter for screening mammogram for malignant neoplasm of breast: Secondary | ICD-10-CM | POA: Diagnosis not present

## 2017-09-05 DIAGNOSIS — Z6822 Body mass index (BMI) 22.0-22.9, adult: Secondary | ICD-10-CM | POA: Diagnosis not present

## 2017-09-05 DIAGNOSIS — Z01419 Encounter for gynecological examination (general) (routine) without abnormal findings: Secondary | ICD-10-CM | POA: Diagnosis not present

## 2017-09-05 DIAGNOSIS — Z803 Family history of malignant neoplasm of breast: Secondary | ICD-10-CM | POA: Diagnosis not present

## 2017-10-03 DIAGNOSIS — M2041 Other hammer toe(s) (acquired), right foot: Secondary | ICD-10-CM | POA: Diagnosis not present

## 2017-10-03 DIAGNOSIS — M79674 Pain in right toe(s): Secondary | ICD-10-CM | POA: Diagnosis not present

## 2017-10-07 ENCOUNTER — Other Ambulatory Visit: Payer: Self-pay | Admitting: Family Medicine

## 2017-10-07 ENCOUNTER — Ambulatory Visit: Payer: BLUE CROSS/BLUE SHIELD | Admitting: Family Medicine

## 2017-10-07 ENCOUNTER — Encounter: Payer: Self-pay | Admitting: Family Medicine

## 2017-10-07 VITALS — BP 144/78 | HR 64 | Temp 98.6°F | Ht 61.0 in | Wt 120.6 lb

## 2017-10-07 DIAGNOSIS — X32XXXD Exposure to sunlight, subsequent encounter: Secondary | ICD-10-CM | POA: Diagnosis not present

## 2017-10-07 DIAGNOSIS — D6851 Activated protein C resistance: Secondary | ICD-10-CM | POA: Diagnosis not present

## 2017-10-07 DIAGNOSIS — L570 Actinic keratosis: Secondary | ICD-10-CM | POA: Diagnosis not present

## 2017-10-07 LAB — COAGUCHEK XS/INR WAIVED
INR: 3.4 — AB (ref 0.9–1.1)
Prothrombin Time: 40.8 s

## 2017-10-07 NOTE — Progress Notes (Signed)
   HPI  Patient presents today for INR check.  Patient has history of factor V Leyden mutation, she denies any bleeding. No dose changes, no missed pills, no change in diet.  PMH: Smoking status noted ROS: Per HPI  Objective: BP (!) 144/78   Pulse 64   Temp 98.6 F (37 C) (Oral)   Ht  (1.549 m)   Wt 120 lb 9.6 oz (54.7 kg)   BMI 22.79 kg/m  Gen: NAD, alert, cooperative with exam HEENT: NCAT CV: RRR, good S1/S2, no murmur Resp: CTABL, no wheezes, non-labored Ext: No edema, warm Neuro: Alert and oriented, No gross deficits  Assessment and plan:  #Factor V Leiden mutation Patient with slightly supratherapeutic INR, decreased weekly dose of Coumadin from 75 mg to 70 mg Recheck in 4 to 6 weeks.   Orders Placed This Encounter  Procedures  . CoaguChek XS/INR Ellender Hose, MD [facility] 10/07/2017, 3:38 PM

## 2017-10-07 NOTE — Patient Instructions (Signed)
Great to see you!  Come back in 3-4 weeks  

## 2017-10-08 ENCOUNTER — Telehealth: Payer: Self-pay

## 2017-10-08 NOTE — Telephone Encounter (Signed)
Pt was prescribed voltaren gel for R wrist pain, possibly due to OA.   Murtis Sink, MD [facility] 10/08/2017, 11:42 AM

## 2017-10-08 NOTE — Telephone Encounter (Signed)
Please advise on DX for Diclofenac that I'm getting a prior auth for

## 2017-10-10 ENCOUNTER — Ambulatory Visit: Payer: BLUE CROSS/BLUE SHIELD | Admitting: Family Medicine

## 2017-11-14 DIAGNOSIS — Z13820 Encounter for screening for osteoporosis: Secondary | ICD-10-CM | POA: Diagnosis not present

## 2017-11-14 DIAGNOSIS — Z809 Family history of malignant neoplasm, unspecified: Secondary | ICD-10-CM | POA: Diagnosis not present

## 2017-11-14 DIAGNOSIS — M8580 Other specified disorders of bone density and structure, unspecified site: Secondary | ICD-10-CM | POA: Diagnosis not present

## 2017-11-28 ENCOUNTER — Ambulatory Visit: Payer: BLUE CROSS/BLUE SHIELD | Admitting: Family Medicine

## 2017-11-28 ENCOUNTER — Encounter: Payer: Self-pay | Admitting: Family Medicine

## 2017-11-28 VITALS — BP 133/67 | HR 64 | Temp 97.4°F | Ht 61.0 in | Wt 123.2 lb

## 2017-11-28 DIAGNOSIS — D6851 Activated protein C resistance: Secondary | ICD-10-CM | POA: Diagnosis not present

## 2017-11-28 LAB — COAGUCHEK XS/INR WAIVED
INR: 2 — ABNORMAL HIGH (ref 0.9–1.1)
Prothrombin Time: 24.3 s

## 2017-11-28 NOTE — Progress Notes (Signed)
   HPI  Patient presents today for INR check.  Patient is anticoagulated with Coumadin due to factor V Leiden mutation.   She denies bleeding, missed doses, or concerns   She has her annual labs and annual physical with her GYN, this was in March of this year.  She was recently started on Boniva  PMH: Smoking status noted ROS: Per HPI  Objective: BP 133/67   Pulse 64   Temp (!) 97.4 F (36.3 C) (Oral)   Ht 5\' 1"  (1.549 m)   Wt 123 lb 3.2 oz (55.9 kg)   BMI 23.28 kg/m  Gen: NAD, alert, cooperative with exam HEENT: NCAT CV: RRR, good S1/S2, no murmur Resp: CTABL, no wheezes, non-labored Ext: No edema, warm Neuro: Alert and oriented, No gross deficits  Assessment and plan:  #Factor V Leiden mutation, chronic anticoagulation INR is therapeutic today, continue Coumadin dose of 70 mg a week, 10 mg daily. Follow-up 6 weeks for INR   Orders Placed This Encounter  Procedures  . CoaguChek XS/INR Ellender HoseWaived    Sam Bradshaw, MD [facility] 11/28/2017, 3:21 PM

## 2017-12-20 DIAGNOSIS — R351 Nocturia: Secondary | ICD-10-CM | POA: Diagnosis not present

## 2017-12-20 DIAGNOSIS — N3281 Overactive bladder: Secondary | ICD-10-CM | POA: Diagnosis not present

## 2018-01-16 ENCOUNTER — Ambulatory Visit: Payer: BLUE CROSS/BLUE SHIELD | Admitting: Pediatrics

## 2018-01-16 ENCOUNTER — Encounter: Payer: Self-pay | Admitting: Pediatrics

## 2018-01-16 VITALS — BP 139/79 | HR 64 | Temp 98.0°F | Ht 61.0 in | Wt 121.2 lb

## 2018-01-16 DIAGNOSIS — D6851 Activated protein C resistance: Secondary | ICD-10-CM | POA: Diagnosis not present

## 2018-01-16 LAB — POCT INR: INR: 2.2 (ref 2–3)

## 2018-01-16 LAB — COAGUCHEK XS/INR WAIVED
INR: 2.2 — AB (ref 0.9–1.1)
PROTHROMBIN TIME: 26.5 s

## 2018-01-16 NOTE — Progress Notes (Signed)
  Subjective:   Patient ID: [name], female    DOB: 07/04/1955, 62 y.o.   MRN: [id number] CC: Anticoagulation  HPI: [name] is a 62 y.o. female   On warfarin anticoagulation for factor V Leiden mutation.  Taking 10 mg daily.  Has not missed any doses recently.  No episodes of vomiting since last visit.  Relevant past medical, surgical, family and social history reviewed. Allergies and medications reviewed and updated. Social History   Tobacco Use  Smoking Status Never Smoker  Smokeless Tobacco Never Used   ROS: Per HPI   Objective:    BP 139/79   Pulse 64   Temp 98 F (36.7 C) (Oral)   Ht 5\' 1"  (1.549 m)   Wt 121 lb 3.2 oz (55 kg)   BMI 22.90 kg/m   Wt Readings from Last 3 Encounters:  01/16/18 121 lb 3.2 oz (55 kg)  11/28/17 123 lb 3.2 oz (55.9 kg)  10/07/17 120 lb 9.6 oz (54.7 kg)    Gen: NAD, alert, cooperative with exam, NCAT EYES: EOMI, no conjunctival injection, or no icterus CV: WWP Resp:  normal WOB Ext: No edema, warm Neuro: Alert and oriented  Assessment & Plan:  Halli was seen today for anticoagulation.  Diagnoses and all orders for this visit:  Factor V Leiden mutation (HCC) INR 2.2, at goal.  Return in 6 weeks for next recheck.  Continue current dosing. -     CoaguChek XS/INR Waived    Follow up plan: Return in about 6 weeks (around 02/27/2018). Rex Krasarol Trudy Kory, MD [facility]

## 2018-01-17 ENCOUNTER — Telehealth: Payer: Self-pay | Admitting: Pediatrics

## 2018-01-17 NOTE — Telephone Encounter (Signed)
Patient aware ok to come in around 2-2:15.  Offered morning appointment, patient does not want to miss a whole day of work.

## 2018-02-25 ENCOUNTER — Other Ambulatory Visit: Payer: Self-pay | Admitting: *Deleted

## 2018-02-25 MED ORDER — WARFARIN SODIUM 5 MG PO TABS: ORAL_TABLET | ORAL | 1 refills | Status: DC

## 2018-03-06 ENCOUNTER — Ambulatory Visit: Payer: BLUE CROSS/BLUE SHIELD | Admitting: Pediatrics

## 2018-03-06 ENCOUNTER — Encounter: Payer: Self-pay | Admitting: Pediatrics

## 2018-03-06 VITALS — BP 132/79 | HR 66 | Temp 97.0°F | Ht 61.0 in | Wt 119.8 lb

## 2018-03-06 DIAGNOSIS — D6851 Activated protein C resistance: Secondary | ICD-10-CM

## 2018-03-06 DIAGNOSIS — Z23 Encounter for immunization: Secondary | ICD-10-CM

## 2018-03-06 DIAGNOSIS — Z8673 Personal history of transient ischemic attack (TIA), and cerebral infarction without residual deficits: Secondary | ICD-10-CM

## 2018-03-06 LAB — COAGUCHEK XS/INR WAIVED
INR: 2 — AB (ref 0.9–1.1)
Prothrombin Time: 23.7 s

## 2018-03-06 NOTE — Progress Notes (Signed)
  Subjective:   Patient ID: [name], female    DOB: Aug 20, 1955, 62 y.o.   MRN: [id number] CC: Anticoagulation  HPI: [name] is a 62 y.o. female   Factor V Leiden mutation: On warfarin.  Has been taking regularly.  Not missed any doses.  No bleeding that she has noticed.  Relevant past medical, surgical, family and social history reviewed. Allergies and medications reviewed and updated. Social History   Tobacco Use  Smoking Status Never Smoker  Smokeless Tobacco Never Used   ROS: Per HPI   Objective:    BP 132/79   Pulse 66   Temp (!) 97 F (36.1 C) (Oral)   Ht 5\' 1"  (1.549 m)   Wt 119 lb 12.8 oz (54.3 kg)   BMI 22.64 kg/m   Wt Readings from Last 3 Encounters:  03/06/18 119 lb 12.8 oz (54.3 kg)  01/16/18 121 lb 3.2 oz (55 kg)  11/28/17 123 lb 3.2 oz (55.9 kg)    Gen: NAD, alert, cooperative with exam, NCAT EYES: EOMI, no conjunctival injection, or no icterus CV: NRRR, normal S1/S2, no murmur, distal pulses 2+ b/l Resp: CTABL, no wheezes, normal WOB Ext: No edema, warm Neuro: Alert and oriented  Assessment & Plan:  Reem was seen today for anticoagulation.  Diagnoses and all orders for this visit:  Factor V Leiden mutation (HCC) INR at goal, see separate anticoagulation.  Continue current dosing. -     CoaguChek XS/INR Waived  H/O: CVA (cerebrovascular accident)  Need for immunization against influenza -     Flu Vaccine QUAD 36+ mos IM   Follow up plan: 4 weeks Rex Kras, MD [place] [facility]

## 2018-03-12 DIAGNOSIS — B078 Other viral warts: Secondary | ICD-10-CM | POA: Diagnosis not present

## 2018-04-24 ENCOUNTER — Encounter: Payer: Self-pay | Admitting: Pediatrics

## 2018-04-24 ENCOUNTER — Encounter: Payer: BLUE CROSS/BLUE SHIELD | Admitting: Pediatrics

## 2018-04-24 ENCOUNTER — Ambulatory Visit (INDEPENDENT_AMBULATORY_CARE_PROVIDER_SITE_OTHER): Payer: BLUE CROSS/BLUE SHIELD | Admitting: Pediatrics

## 2018-04-24 VITALS — BP 123/82 | HR 66 | Temp 96.8°F | Ht 61.0 in | Wt 122.6 lb

## 2018-04-24 DIAGNOSIS — Z8673 Personal history of transient ischemic attack (TIA), and cerebral infarction without residual deficits: Secondary | ICD-10-CM

## 2018-04-24 DIAGNOSIS — D6851 Activated protein C resistance: Secondary | ICD-10-CM | POA: Diagnosis not present

## 2018-04-24 LAB — COAGUCHEK XS/INR WAIVED
INR: 1.9 — AB (ref 0.9–1.1)
PROTHROMBIN TIME: 22.7 s

## 2018-04-24 NOTE — Progress Notes (Signed)
  Subjective:   Patient ID: [name], female    DOB: 08/19/1955, 62 y.o.   MRN: [id number] CC: Anticoagulation  HPI: [name] is a 62 y.o. female   No bleeding episodes.  Taking medicine regularly, did take in the morning instead a evening once a couple weeks ago. Ate turnip greens 4 days ago.  Takes daily women's multivitamin, she is not sure if it has vitamin K in it or not. Has been on it for years.   Relevant past medical, surgical, family and social history reviewed. Allergies and medications reviewed and updated. Social History   Tobacco Use  Smoking Status Never Smoker  Smokeless Tobacco Never Used   ROS: Per HPI   Objective:    BP 123/82   Pulse 66   Temp (!) 96.8 F (36 C) (Oral)   Ht 5\' 1"  (1.549 m)   Wt 122 lb 9.6 oz (55.6 kg)   BMI 23.17 kg/m   Wt Readings from Last 3 Encounters:  04/24/18 122 lb 9.6 oz (55.6 kg)  03/06/18 119 lb 12.8 oz (54.3 kg)  01/16/18 121 lb 3.2 oz (55 kg)    Gen: NAD, alert, cooperative with exam, NCAT EYES: EOMI, no conjunctival injection, or no icterus CV: NRRR, normal S1/S2, no murmur, distal pulses 2+ b/l Resp: CTABL, no wheezes, normal WOB Ext: No edema, warm  Neuro: Alert and oriented, strength equal b/l UE and LE, coordination grossly normal  Assessment & Plan:  [name] was seen today for anticoagulation.  Diagnoses and all orders for this visit:  Factor V Leiden mutation (HCC) INR 1.9.  Patient to check to see if daily vitamin contains vitamin K, may need to skip a couple days a week taking the vitamin to keep INR at goal.  Continue a consistent amount of greens.  Follow-up 5 weeks. -     CoaguChek XS/INR Waived  H/O: CVA (cerebrovascular accident) -     CoaguChek XS/INR Waived   Follow up plan: Return in about 5 weeks (around 05/29/2018). Will need yearly blood work including kidney function next visit unless done at outside office. Rex Krasarol Vincent, MD [facility]

## 2018-05-30 ENCOUNTER — Encounter: Payer: Self-pay | Admitting: Pediatrics

## 2018-05-30 ENCOUNTER — Ambulatory Visit: Payer: BLUE CROSS/BLUE SHIELD | Admitting: Pediatrics

## 2018-05-30 VITALS — BP 133/83 | HR 66 | Temp 97.8°F | Ht 61.0 in | Wt 122.0 lb

## 2018-05-30 DIAGNOSIS — D6851 Activated protein C resistance: Secondary | ICD-10-CM

## 2018-05-30 LAB — COAGUCHEK XS/INR WAIVED
INR: 1.9 — AB (ref 0.9–1.1)
Prothrombin Time: 23.2 s

## 2018-05-30 NOTE — Progress Notes (Signed)
  Subjective:   Patient ID: DEMIAH Schaefer, female    DOB: [birth year], 63 y.o.   MRN: [id number] CC: Anticoagulation (5 week)  HPI: [name] is a 63 y.o. female   Taking womens once a day vitamin, does have vitamin K.  She has better energy when she takes this.  She is taking this every day since before starting on warfarin she says.  Has not had any greens in the last week.  Due for colonoscopy, has upcoming appointment.  Also has appointment with gynecology for yearly exam.  She gets blood work done through their office.  Due for hepatitis C screening.  Relevant past medical, surgical, family and social history reviewed. Allergies and medications reviewed and updated. Social History   Tobacco Use  Smoking Status Never Smoker  Smokeless Tobacco Never Used   ROS: Per HPI   Objective:    BP 133/83   Pulse 66   Temp 97.8 F (36.6 C) (Oral)   Ht 5\' 1"  (1.549 m)   Wt 122 lb (55.3 kg)   BMI 23.05 kg/m   Wt Readings from Last 3 Encounters:  05/30/18 122 lb (55.3 kg)  04/24/18 122 lb 9.6 oz (55.6 kg)  03/06/18 119 lb 12.8 oz (54.3 kg)    Gen: NAD, alert, cooperative with exam, NCAT EYES: EOMI, no conjunctival injection, or no icterus CV: NRRR, normal S1/S2, no murmur, distal pulses 2+ b/l Resp: CTABL, no wheezes, normal WOB Ext: No edema, warm Neuro: Alert and oriented  Assessment & Plan:  [name] was seen today for anticoagulation.  Diagnoses and all orders for this visit:  Factor V Leiden mutation (HCC) INR 1.9, slightly low below goal 2-3.  Continue current dosing.  Recommended skipping the vitamin once or twice a week, will likely help get warfarin back within therapeutic range. -     CoaguChek XS/INR Waived   Follow up plan: 5 weeks Rex Kras, MD [place] [facility]

## 2018-06-04 ENCOUNTER — Ambulatory Visit: Payer: BLUE CROSS/BLUE SHIELD | Admitting: Family Medicine

## 2018-06-04 ENCOUNTER — Ambulatory Visit (INDEPENDENT_AMBULATORY_CARE_PROVIDER_SITE_OTHER): Payer: BLUE CROSS/BLUE SHIELD

## 2018-06-04 VITALS — BP 153/85 | HR 71 | Temp 98.8°F | Ht 61.0 in | Wt 122.0 lb

## 2018-06-04 DIAGNOSIS — M533 Sacrococcygeal disorders, not elsewhere classified: Secondary | ICD-10-CM

## 2018-06-04 DIAGNOSIS — S39012A Strain of muscle, fascia and tendon of lower back, initial encounter: Secondary | ICD-10-CM | POA: Diagnosis not present

## 2018-06-04 DIAGNOSIS — W19XXXA Unspecified fall, initial encounter: Secondary | ICD-10-CM | POA: Diagnosis not present

## 2018-06-04 DIAGNOSIS — S3992XA Unspecified injury of lower back, initial encounter: Secondary | ICD-10-CM | POA: Diagnosis not present

## 2018-06-04 MED ORDER — PREDNISONE 10 MG (21) PO TBPK: ORAL_TABLET | ORAL | 0 refills | Status: DC

## 2018-06-04 MED ORDER — METHYLPREDNISOLONE ACETATE 80 MG/ML IJ SUSP
80.0000 mg | Freq: Once | INTRAMUSCULAR | Status: AC
  Administered 2018-06-04: 80 mg via INTRAMUSCULAR

## 2018-06-04 NOTE — Patient Instructions (Signed)
I reviewed your x-ray and there was nothing to suggest fracture or dislocation.  There is a fairly notable scoliotic curve but you knew about this.  I have given you a dose of Depo-Medrol here in office to help with pain and inflammation.  You have also received a prednisone Dosepak that you may start tomorrow.  Again, this medication can be stimulating so it should be taken with breakfast.  If symptoms do not substantially improve or if they become abruptly worse, please seek medical attention

## 2018-06-04 NOTE — Progress Notes (Signed)
Subjective: CC: Fall PCP: Eustaquio Maize, MD HPI:[name] is a 63 y.o. female presenting to clinic today for:  1. Fall Patient reports that she sustained a mechanical fall 2 days ago.  She tripped over a rug and fell onto her bottom.  She notes that subsequently she has had pain in her low back and into her left gluteus.  She reports that pain seems to be worse with getting up from a seated position and walking.  Denies any sensation changes in the lower extremities or any weakness.  She is not had any subsequent falls or instability.  She has been applying Biofreeze with little improvement in symptoms.  She of note is on an anticoagulant and is unable to tolerate oral NSAIDs.  She does not wish for any sedating medication because she would like to return to work.   ROS: Per HPI  Allergies  Allergen Reactions  . Codeine   . Donnatal [Belladonna Alk-Phenobarb Er]   . Librium    Past Medical History:  Diagnosis Date  . Anemia     Current Outpatient Medications:  .  diclofenac sodium (VOLTAREN) 1 % GEL, APPLY 1 GRAM TO AFFECTED AREA TWICE DAILY AS NEEDED, Disp: 100 g, Rfl: 2 .  ESTRACE VAGINAL 0.1 MG/GM vaginal cream, Apply vaginally as directed, Disp: , Rfl:  .  fluticasone (FLONASE) 50 MCG/ACT nasal spray, Place 2 sprays into the nose daily. Reported on 05/13/2015, Disp: , Rfl:  .  ibandronate (BONIVA) 150 MG tablet, Take 150 mg by mouth every 30 (thirty) days. Take in the morning with a full glass of water, on an empty stomach, and do not take anything else by mouth or lie down for the next 30 min., Disp: , Rfl:  .  Ketoprofen 10 % CREA, Apply to area of pain 1 to 2 times per day., Disp: 60 g, Rfl: 0 .  loratadine (CLARITIN) 10 MG tablet, Take 1 tablet (10 mg total) by mouth daily., Disp: 30 tablet, Rfl: 1 .  warfarin (COUMADIN) 5 MG tablet, TAKE 1 TO 2 TABLETS DAILY AS DIRECTED, Disp: 180 tablet, Rfl: 1 Social History   Socioeconomic History  . Marital status: Married   Spouse name: Not on file  . Number of children: Not on file  . Years of education: Not on file  . Highest education level: Not on file  Occupational History  . Not on file  Social Needs  . Financial resource strain: Not on file  . Food insecurity:    Worry: Not on file    Inability: Not on file  . Transportation needs:    Medical: Not on file    Non-medical: Not on file  Tobacco Use  . Smoking status: Never Smoker  . Smokeless tobacco: Never Used  Substance and Sexual Activity  . Alcohol use: Not on file  . Drug use: Not on file  . Sexual activity: Not on file  Lifestyle  . Physical activity:    Days per week: Not on file    Minutes per session: Not on file  . Stress: Not on file  Relationships  . Social connections:    Talks on phone: Not on file    Gets together: Not on file    Attends religious service: Not on file    Active member of club or organization: Not on file    Attends meetings of clubs or organizations: Not on file    Relationship status: Not on file  .  Intimate partner violence:    Fear of current or ex partner: Not on file    Emotionally abused: Not on file    Physically abused: Not on file    Forced sexual activity: Not on file  Other Topics Concern  . Not on file  Social History Narrative  . Not on file   No family history on file.  Objective: Office vital signs reviewed. BP (!) 153/85   Pulse 71   Temp 98.8 F (37.1 C) (Oral)   Ht _0  (1.549 m)   Wt 122 lb (55.3 kg)   BMI 23.05 kg/m   Physical Examination:  General: Awake, alert, well nourished, No acute distress Extremities: warm, well perfused, No edema, cyanosis or clubbing; +2 pulses bilaterally MSK: normal gait and station  Lumbar spine: She is noted to have a scoliotic curve.  She has full active range of motion.  She has no midline tenderness palpation.  Paraspinal muscle tenderness palpation is present, particularly over the lumbosacral and sacroiliac junctions.  She has  tenderness palpation within the left gluteus.  Dg Lumbar Spine 2-3 Views  Result Date: 06/04/2018 CLINICAL DATA:  Coccygeal pain post fall EXAM: LUMBAR SPINE - 2-3 VIEW COMPARISON:  None FINDINGS: SI joints symmetric and preserved. Sacral foramina symmetric. No definite sacrococcygeal fracture identified. Disc space narrowing at L3-L4 and L4-L5. Levoconvex lumbar scoliosis. IMPRESSION: No acute osseous abnormalities. Electronically Signed   By: Lavonia Dana M.D.   On: 06/04/2018 17:35   Assessment/ Plan: 63 y.o. female   1. Strain of lumbar region, initial encounter Physical exam suggestive of lumbar strain.  X-rays were obtained to further evaluate given known osteoporosis and anticoagulated state.  This was unremarkable upon personal review except for scoliotic curve and mild degenerative changes.  Radiologist also found no acute abnormalities but did note disc space narrowing at L3 on L4 and L4 on L5.  Because she is intolerant to oral NSAIDs secondary to chronic anticoagulation and does not wish for sedating medication she was given a dose of Depo-Medrol 80 mg here in office prescribed a prednisone Dosepak for pain relief.  Home care instructions reviewed and a handout was provided.  Home physical therapy was also given.  She will follow-up PRN. - methylPREDNISolone acetate (DEPO-MEDROL) injection 80 mg  2. Coccyx pain - DG Lumbar Spine 2-3 Views; Future - methylPREDNISolone acetate (DEPO-MEDROL) injection 80 mg  3. Fall, initial encounter   Orders Placed This Encounter  Procedures  . DG Sacrum/Coccyx    Standing Status:   Future    Standing Expiration Date:   08/03/2019    Order Specific Question:   Reason for Exam (SYMPTOM  OR DIAGNOSIS REQUIRED)    Answer:   coccyx pain after fall    Order Specific Question:   Preferred imaging location?    Answer:   Internal    Order Specific Question:   Radiology Contrast Protocol - do NOT remove file path    Answer:    [email or web address]   Meds ordered this encounter  Medications  . predniSONE (STERAPRED UNI-PAK 21 TAB) 10 MG (21) TBPK tablet    Sig: As directed x 6 days    Dispense:  21 tablet    Refill:  0  . methylPREDNISolone acetate (DEPO-MEDROL) injection 80 mg     [name], DO [place] [phone]

## 2018-06-06 DIAGNOSIS — Z01818 Encounter for other preprocedural examination: Secondary | ICD-10-CM | POA: Diagnosis not present

## 2018-06-11 ENCOUNTER — Telehealth: Payer: Self-pay | Admitting: Pediatrics

## 2018-06-11 NOTE — Telephone Encounter (Signed)
PT was seen last week and given predinsone and she finished that Tuesday and still not better wants to know what else she can do

## 2018-06-11 NOTE — Telephone Encounter (Signed)
FYI for provider  She works and does not have time for physical therapy.  She is concerned that muscle relaxers would interfere with working also.    She will give it a little more time and call back if it does not get any better.

## 2018-06-11 NOTE — Telephone Encounter (Signed)
I can either refer to PT or ortho or can rx a muscle relaxer in addition to the referral.  Please let me know what she would like to do

## 2018-06-11 NOTE — Telephone Encounter (Signed)
Seen by Dr. Nadine Counts for pain in lower lumbar.  Please advise .

## 2018-06-11 NOTE — Telephone Encounter (Signed)
Sounds like a plan

## 2018-06-11 NOTE — Telephone Encounter (Signed)
Patient decided she would like to try physical therapy next door.   Hopeful she can get early morning or late even appointments and not miss work much.

## 2018-06-12 ENCOUNTER — Other Ambulatory Visit: Payer: Self-pay | Admitting: Family Medicine

## 2018-06-12 DIAGNOSIS — M545 Low back pain, unspecified: Secondary | ICD-10-CM

## 2018-06-18 ENCOUNTER — Telehealth: Payer: Self-pay | Admitting: Pediatrics

## 2018-06-18 ENCOUNTER — Telehealth: Payer: Self-pay | Admitting: Family Medicine

## 2018-06-18 NOTE — Telephone Encounter (Signed)
[name] patient. Please give your opinion on patient's question to get colonoscopy or "virtual" colonoscopy?

## 2018-06-18 NOTE — Telephone Encounter (Signed)
Pt. Would have an increased risk of stroke if she stops the coumadin. Since the time would be five days prior, her risk would be significant. If she is willing to take that chance, a regular colonoscopy would be reasonable. If she has never had a polyp on colonoscopy, cologard would be a good alternative.

## 2018-06-19 NOTE — Telephone Encounter (Signed)
Patient states she would like to see Dr. Reece Agar since [place] left.  Has a est care apt with her 2/13.  Patient states that [facility] sent a request to do colonoscopy and states someone here denied it since she can not come off of comandin. Refer to phone note from yesterday - patient aware doctor stacks recommendations and would like to know if she could do the lovanox in injection and would that help reduce her for having a stroke? Patient would like to do the colonoscopy instead of colorguard.  Patient aware you are out of the office today and will return tomorrow.

## 2018-06-20 ENCOUNTER — Other Ambulatory Visit: Payer: Self-pay | Admitting: Family Medicine

## 2018-06-20 MED ORDER — ENOXAPARIN SODIUM 60 MG/0.6ML ~~LOC~~ SOLN: 1.0000 mg/kg | Freq: Two times a day (BID) | SUBCUTANEOUS | 0 refills | Status: DC

## 2018-06-20 NOTE — Telephone Encounter (Signed)
See additional phone call with instructions

## 2018-06-20 NOTE — Telephone Encounter (Signed)
Patient notified. Can we write a letter with specific instructions on Lovenox and send to [facility]?

## 2018-06-20 NOTE — Telephone Encounter (Signed)
Patient will need to stop warfarin 6 days prior to procedure.  She will come into the office after 2 days off Warfarin for INR check.  If she is subtherapeutic can start Lovenox 1mg /kg twice daily 55 mg per dose.  She will continue this regimen until day before surgery, when she will take the morning injection but should skip the evening dose.  She may resume both warfarin and Lovenox the day of colonoscopy if no significant bleeds have occurred, she is hemostatic and clinically stable per GI's evaluation.  She will continue both the warfarin and Lovenox for 4-6 days or until her INR is therapeutic again.  She will need to come into the office for INR check 4 days after colonoscopy, sooner if concerns arise.  Please send instructions to her gastroenterologist, schedule INR checks as above for patient and I will send in weight based lovenox so that she has this on hand.

## 2018-06-20 NOTE — Telephone Encounter (Signed)
Instructions sent to GI office and patient notified

## 2018-06-20 NOTE — Telephone Encounter (Signed)
Lovenox would probably be an ok alternative if GI would allow her to remain on anticoagulation. I agree with Dr Darlyn Read' advice.  Her risk for clot is high.  Colonoscopy is a low risk procedure.  Given her high risk, staying on anticoagulation is recommended.

## 2018-06-24 ENCOUNTER — Ambulatory Visit: Payer: BLUE CROSS/BLUE SHIELD | Admitting: Physical Therapy

## 2018-06-28 ENCOUNTER — Telehealth: Payer: Self-pay | Admitting: Family Medicine

## 2018-06-28 NOTE — Telephone Encounter (Signed)
PT was seen on 1/15 for a fall, pt states that she did have xray done that day and was told that she didn't break anything, pt is still in pain and wants to see if someone can review the xray again just to make sure she didn't break anything since she is still in so much pain

## 2018-06-28 NOTE — Telephone Encounter (Signed)
X-ray negative. If pain continues needs to schedule follow up visit.

## 2018-06-28 NOTE — Telephone Encounter (Signed)
Please advise 

## 2018-06-30 ENCOUNTER — Encounter: Payer: Self-pay | Admitting: Family

## 2018-06-30 ENCOUNTER — Ambulatory Visit: Payer: BLUE CROSS/BLUE SHIELD | Admitting: Family

## 2018-06-30 ENCOUNTER — Telehealth: Payer: Self-pay | Admitting: Family Medicine

## 2018-06-30 VITALS — BP 154/85 | HR 64 | Temp 97.2°F | Ht 61.0 in | Wt 122.0 lb

## 2018-06-30 DIAGNOSIS — W19XXXD Unspecified fall, subsequent encounter: Secondary | ICD-10-CM | POA: Diagnosis not present

## 2018-06-30 DIAGNOSIS — M7918 Myalgia, other site: Secondary | ICD-10-CM

## 2018-06-30 DIAGNOSIS — Y92009 Unspecified place in unspecified non-institutional (private) residence as the place of occurrence of the external cause: Secondary | ICD-10-CM | POA: Diagnosis not present

## 2018-06-30 MED ORDER — METHYLPREDNISOLONE ACETATE 80 MG/ML IJ SUSP
80.0000 mg | Freq: Once | INTRAMUSCULAR | Status: AC
  Administered 2018-06-30: 80 mg via INTRAMUSCULAR

## 2018-06-30 MED ORDER — GABAPENTIN 100 MG PO CAPS: 100.0000 mg | ORAL_CAPSULE | Freq: Three times a day (TID) | ORAL | 3 refills | Status: DC

## 2018-06-30 NOTE — Telephone Encounter (Signed)
Patient aware.

## 2018-06-30 NOTE — Telephone Encounter (Signed)
Still having pain on left side of buttocks. appt made to be seen

## 2018-06-30 NOTE — Patient Instructions (Signed)
Sciatica    Sciatica is pain, numbness, weakness, or tingling along the path of the sciatic nerve. The sciatic nerve starts in the lower back and runs down the back of each leg. The nerve controls the muscles in the lower leg and in the back of the knee. It also provides feeling (sensation) to the back of the thigh, the lower leg, and the sole of the foot. Sciatica is a symptom of another medical condition that pinches or puts pressure on the sciatic nerve.  Generally, sciatica only affects one side of the body. Sciatica usually goes away on its own or with treatment. In some cases, sciatica may keep coming back (recur).  What are the causes?  This condition is caused by pressure on the sciatic nerve, or pinching of the sciatic nerve. This may be the result of:  · A disk in between the bones of the spine (vertebrae) bulging out too far (herniated disk).  · Age-related changes in the spinal disks (degenerative disk disease).  · A pain disorder that affects a muscle in the buttock (piriformis syndrome).  · Extra bone growth (bone spur) near the sciatic nerve.  · An injury or break (fracture) of the pelvis.  · Pregnancy.  · Tumor (rare).  What increases the risk?  The following factors may make you more likely to develop this condition:  · Playing sports that place pressure or stress on the spine, such as football or weight lifting.  · Having poor strength and flexibility.  · A history of back injury.  · A history of back surgery.  · Sitting for long periods of time.  · Doing activities that involve repetitive bending or lifting.  · Obesity.  What are the signs or symptoms?  Symptoms can vary from mild to very severe, and they may include:  · Any of these problems in the lower back, leg, hip, or buttock:  ? Mild tingling or dull aches.  ? Burning sensations.  ? Sharp pains.  · Numbness in the back of the calf or the sole of the foot.  · Leg weakness.  · Severe back pain that makes movement difficult.  These symptoms  may get worse when you cough, sneeze, or laugh, or when you sit or stand for long periods of time. Being overweight may also make symptoms worse. In some cases, symptoms may recur over time.  How is this diagnosed?  This condition may be diagnosed based on:  · Your symptoms.  · A physical exam. Your health care provider may ask you to do certain movements to check whether those movements trigger your symptoms.  · You may have tests, including:  ? Blood tests.  ? X-rays.  ? MRI.  ? CT scan.  How is this treated?  In many cases, this condition improves on its own, without any treatment. However, treatment may include:  · Reducing or modifying physical activity during periods of pain.  · Exercising and stretching to strengthen your abdomen and improve the flexibility of your spine.  · Icing and applying heat to the affected area.  · Medicines that help:  ? To relieve pain and swelling.  ? To relax your muscles.  · Injections of medicines that help to relieve pain, irritation, and inflammation around the sciatic nerve (steroids).  · Surgery.  Follow these instructions at home:  Medicines  · Take over-the-counter and prescription medicines only as told by your health care provider.  · Do not drive or operate heavy   machinery while taking prescription pain medicine.  Managing pain  · If directed, apply ice to the affected area.  ? Put ice in a plastic bag.  ? Place a towel between your skin and the bag.  ? Leave the ice on for 20 minutes, 2-3 times a day.  · After icing, apply heat to the affected area before you exercise or as often as told by your health care provider. Use the heat source that your health care provider recommends, such as a moist heat pack or a heating pad.  ? Place a towel between your skin and the heat source.  ? Leave the heat on for 20-30 minutes.  ? Remove the heat if your skin turns bright red. This is especially important if you are unable to feel pain, heat, or cold. You may have a greater risk  of getting burned.  Activity  · Return to your normal activities as told by your health care provider. Ask your health care provider what activities are safe for you.  ? Avoid activities that make your symptoms worse.  · Take brief periods of rest throughout the day. Resting in a lying or standing position is usually better than sitting to rest.  ? When you rest for longer periods, mix in some mild activity or stretching between periods of rest. This will help to prevent stiffness and pain.  ? Avoid sitting for long periods of time without moving. Get up and move around at least one time each hour.  · Exercise and stretch regularly, as told by your health care provider.  · Do not lift anything that is heavier than 10 lb (4.5 kg) while you have symptoms of sciatica. When you do not have symptoms, you should still avoid heavy lifting, especially repetitive heavy lifting.  · When you lift objects, always use proper lifting technique, which includes:  ? Bending your knees.  ? Keeping the load close to your body.  ? Avoiding twisting.  General instructions  · Use good posture.  ? Avoid leaning forward while sitting.  ? Avoid hunching over while standing.  · Maintain a healthy weight. Excess weight puts extra stress on your back and makes it difficult to maintain good posture.  · Wear supportive, comfortable shoes. Avoid wearing high heels.  · Avoid sleeping on a mattress that is too soft or too hard. A mattress that is firm enough to support your back when you sleep may help to reduce your pain.  · Keep all follow-up visits as told by your health care provider. This is important.  Contact a health care provider if:  · You have pain that wakes you up when you are sleeping.  · You have pain that gets worse when you lie down.  · Your pain is worse than you have experienced in the past.  · Your pain lasts longer than 4 weeks.  · You experience unexplained weight loss.  Get help right away if:  · You lose control of your  bowel or bladder (incontinence).  · You have:  ? Weakness in your lower back, pelvis, buttocks, or legs that gets worse.  ? Redness or swelling of your back.  ? A burning sensation when you urinate.  This information is not intended to replace advice given to you by your health care provider. Make sure you discuss any questions you have with your health care provider.  Document Released: 05/01/2001 Document Revised: 10/11/2015 Document Reviewed: 01/14/2015  Elsevier Interactive Patient Education ©   2019 [employer]

## 2018-06-30 NOTE — Progress Notes (Signed)
Subjective:    Patient ID: [name], female    DOB: 1956/04/30, 63 y.o.   MRN: [id number]  Chief Complaint  Patient presents with  . pain at tailbone with pain when walking   PT presents to the office today with recurrent right gluteal pain. She fell 6 weeks ago and fell on her buttocks. She had a negative x-ray 06/04/18 and was given prednisone and Depo-Medrol injection. She reports the overall pain  Back Pain  This is a new problem. The current episode started more than 1 month ago. The problem occurs constantly. The problem is unchanged. The pain is present in the gluteal. The quality of the pain is described as aching. The pain is at a severity of 9/10. The pain is moderate. The symptoms are aggravated by standing. Pertinent negatives include no bladder incontinence, bowel incontinence, leg pain, tingling or weakness. She has tried bed rest and NSAIDs (prednisone ) for the symptoms. The treatment provided mild relief.      Review of Systems  Gastrointestinal: Negative for bowel incontinence.  Genitourinary: Negative for bladder incontinence.  Musculoskeletal: Positive for back pain.  Neurological: Negative for tingling and weakness.  All other systems reviewed and are negative.      Objective:   Physical Exam Vitals signs reviewed.  Constitutional:      General: She is not in acute distress.    Appearance: She is well-developed.  HENT:     Head: Normocephalic and atraumatic.  Eyes:     Pupils: Pupils are equal, round, and reactive to light.  Neck:     Musculoskeletal: Normal range of motion and neck supple.     Thyroid: No thyromegaly.  Cardiovascular:     Rate and Rhythm: Normal rate and regular rhythm.     Heart sounds: Normal heart sounds. No murmur.  Pulmonary:     Effort: Pulmonary effort is normal. No respiratory distress.     Breath sounds: Normal breath sounds. No wheezing.  Abdominal:     General: Bowel sounds are normal. There is no distension.   Palpations: Abdomen is soft.     Tenderness: There is no abdominal tenderness.  Musculoskeletal: Normal range of motion.        General: No tenderness.  Skin:    General: Skin is warm and dry.  Neurological:     Mental Status: She is alert and oriented to person, place, and time.     Cranial Nerves: No cranial nerve deficit.     Deep Tendon Reflexes: Reflexes are normal and symmetric.  Psychiatric:        Behavior: Behavior normal.        Thought Content: Thought content normal.        Judgment: Judgment normal.     BP (!) 154/85   Pulse 64   Temp (!) 97.2 F (36.2 C) (Oral)   Ht 5\' 1"  (1.549 m)   Wt 122 lb (55.3 kg)   BMI 23.05 kg/m        Assessment & Plan:  [name] comes in today with chief complaint of pain at tailbone with pain when walking   Diagnosis and orders addressed:  1. Gluteal pain - gabapentin (NEURONTIN) 100 MG capsule; Take 1 capsule (100 mg total) by mouth 3 (three) times daily.  Dispense: 90 capsule; Refill: 3 - methylPREDNISolone acetate (DEPO-MEDROL) injection 80 mg  2. Fall in home, subsequent encounter   Rest Ice ROM exercises  Follow up with Chiropractor  RTO  if symptoms worsen or do not improve   Jannifer Rodney, FNP

## 2018-06-30 NOTE — Telephone Encounter (Signed)
Phone call taken care of in different encounter.  This encounter will now be closed  

## 2018-07-02 DIAGNOSIS — M9904 Segmental and somatic dysfunction of sacral region: Secondary | ICD-10-CM | POA: Diagnosis not present

## 2018-07-02 DIAGNOSIS — M62830 Muscle spasm of back: Secondary | ICD-10-CM | POA: Diagnosis not present

## 2018-07-02 DIAGNOSIS — M9903 Segmental and somatic dysfunction of lumbar region: Secondary | ICD-10-CM | POA: Diagnosis not present

## 2018-07-02 DIAGNOSIS — M9902 Segmental and somatic dysfunction of thoracic region: Secondary | ICD-10-CM | POA: Diagnosis not present

## 2018-07-03 ENCOUNTER — Ambulatory Visit: Payer: BLUE CROSS/BLUE SHIELD | Admitting: Pediatrics

## 2018-07-03 DIAGNOSIS — M9904 Segmental and somatic dysfunction of sacral region: Secondary | ICD-10-CM | POA: Diagnosis not present

## 2018-07-03 DIAGNOSIS — M62830 Muscle spasm of back: Secondary | ICD-10-CM | POA: Diagnosis not present

## 2018-07-03 DIAGNOSIS — M9903 Segmental and somatic dysfunction of lumbar region: Secondary | ICD-10-CM | POA: Diagnosis not present

## 2018-07-03 DIAGNOSIS — M9902 Segmental and somatic dysfunction of thoracic region: Secondary | ICD-10-CM | POA: Diagnosis not present

## 2018-07-04 ENCOUNTER — Ambulatory Visit: Payer: BLUE CROSS/BLUE SHIELD | Admitting: Family Medicine

## 2018-07-04 VITALS — BP 133/80 | HR 71 | Temp 97.3°F | Ht 61.0 in | Wt 123.0 lb

## 2018-07-04 DIAGNOSIS — D6851 Activated protein C resistance: Secondary | ICD-10-CM

## 2018-07-04 DIAGNOSIS — R791 Abnormal coagulation profile: Secondary | ICD-10-CM | POA: Diagnosis not present

## 2018-07-04 LAB — COAGUCHEK XS/INR WAIVED
INR: 3.5 — ABNORMAL HIGH (ref 0.9–1.1)
Prothrombin Time: 42.3 s

## 2018-07-04 NOTE — Progress Notes (Signed)
Subjective: CC: Factor V Leiden PCP: [name], DO HPI:[name] is a 63 y.o. female presenting to clinic today for:  1. Factor V Leiden Patient with history of factor V Leyden.  She sustained 2 clots in her lifetime, once in 2004 and once in 2006.  She reports both of these were cerebral clots.  She is on chronic anticoagulation with warfarin.  Goal INR is 2-3.  Last INR was subtherapeutic at 1.9 on 05/30/2018.  She has an upcoming colonoscopy scheduled for the end of March.  There is plan for bridge therapy with Lovenox 1 mg/kg twice daily.  This has already been sent to her pharmacy.  She reports compliance with therapy and has been laying off greens because her last INR was subtherapeutic at 1.9.  She denies any bleeding, no rectal bleeding, no blood in urine, no vaginal bleeding, no bleeding with brushing teeth.   ROS: Per HPI  Allergies  Allergen Reactions  . Codeine   . Donnatal [Belladonna Alk-Phenobarb Er]   . Librium    Past Medical History:  Diagnosis Date  . Anemia     Current Outpatient Medications:  .  diclofenac sodium (VOLTAREN) 1 % GEL, APPLY 1 GRAM TO AFFECTED AREA TWICE DAILY AS NEEDED, Disp: 100 g, Rfl: 2 .  ESTRACE VAGINAL 0.1 MG/GM vaginal cream, Apply vaginally as directed, Disp: , Rfl:  .  fluticasone (FLONASE) 50 MCG/ACT nasal spray, Place 2 sprays into the nose daily. Reported on 05/13/2015, Disp: , Rfl:  .  gabapentin (NEURONTIN) 100 MG capsule, Take 1 capsule (100 mg total) by mouth 3 (three) times daily., Disp: 90 capsule, Rfl: 3 .  ibandronate (BONIVA) 150 MG tablet, Take 150 mg by mouth every 30 (thirty) days. Take in the morning with a full glass of water, on an empty stomach, and do not take anything else by mouth or lie down for the next 30 min., Disp: , Rfl:  .  Ketoprofen 10 % CREA, Apply to area of pain 1 to 2 times per day., Disp: 60 g, Rfl: 0 .  loratadine (CLARITIN) 10 MG tablet, Take 1 tablet (10 mg total) by mouth daily., Disp:  30 tablet, Rfl: 1 .  warfarin (COUMADIN) 5 MG tablet, TAKE 1 TO 2 TABLETS DAILY AS DIRECTED, Disp: 180 tablet, Rfl: 1 .  enoxaparin (LOVENOX) 60 MG/0.6ML injection, Inject 0.55 mLs (55 mg total) into the skin every 12 (twelve) hours. As directed.  Hold pm dose evening before colonoscopy. (Patient not taking: Reported on 07/04/2018), Disp: 12 mL, Rfl: 0 Social History   Socioeconomic History  . Marital status: Married    Spouse name: Not on file  . Number of children: Not on file  . Years of education: Not on file  . Highest education level: Not on file  Occupational History  . Not on file  Social Needs  . Financial resource strain: Not on file  . Food insecurity:    Worry: Not on file    Inability: Not on file  . Transportation needs:    Medical: Not on file    Non-medical: Not on file  Tobacco Use  . Smoking status: Never Smoker  . Smokeless tobacco: Never Used  Substance and Sexual Activity  . Alcohol use: Not on file  . Drug use: Not on file  . Sexual activity: Not on file  Lifestyle  . Physical activity:    Days per week: Not on file    Minutes per session: Not  on file  . Stress: Not on file  Relationships  . Social connections:    Talks on phone: Not on file    Gets together: Not on file    Attends religious service: Not on file    Active member of club or organization: Not on file    Attends meetings of clubs or organizations: Not on file    Relationship status: Not on file  . Intimate partner violence:    Fear of current or ex partner: Not on file    Emotionally abused: Not on file    Physically abused: Not on file    Forced sexual activity: Not on file  Other Topics Concern  . Not on file  Social History Narrative  . Not on file   No family history on file.  Objective: Office vital signs reviewed. BP 133/80   Pulse 71   Temp (!) 97.3 F (36.3 C) (Oral)   Ht '5\' 1"'$  (1.549 m)   Wt 123 lb (55.8 kg)   BMI 23.24 kg/m   Physical Examination:  General:  Awake, alert, well nourished, No acute distress HEENT: Normal, sclera white, MMM Cardio: regular rate and rhythm, S1S2 heard, no murmurs appreciated Pulm: clear to auscultation bilaterally, no wheezes, rhonchi or rales; normal work of breathing on room air  Assessment/ Plan: 63 y.o. female   1. Factor V Leiden mutation ([place]) Supratherapeutic INR 3.5 today.  She can either eat greens and continue current regimen or plan to skip tonight's dose and resume normal dosing tomorrow with recheck in the next 1-2 weeks.  We discussed at length her instructions for preop anticoagulation.  These were given to her in her AVS today.  We also discussed consideration for at home INR testing.  She may be interested in this. - CoaguChek XS/INR Waived  2. Supratherapeutic INR As above   Orders Placed This Encounter  Procedures  . CoaguChek XS/INR Waived   No orders of the defined types were placed in this encounter.    [name], DO [place] [phone]

## 2018-07-04 NOTE — Patient Instructions (Addendum)
The INR is high.  Skip tonight (or eat greens).    For your colonoscopy:   Stop warfarin 6 days prior to procedure.    Come into the office after 2 days off Warfarin for INR check.   If INR is subtherapeutic (<2.0) ok to start Lovenox 1mg /kg twice daily 55 mg per dose.    Continue this regimen until day before surgery, when you will take the morning injection ONLY and should skip the evening dose.    You may resume BOTH warfarin and Lovenox the day of colonoscopy IF no significant bleeds have occurred (the GI specialist should tell you if it is ok to start back)  Continue both the warfarin and Lovenox for 4-6 days or until her INR is therapeutic again (in other words plan to see me about 4-5 days after your procedure for INR check.

## 2018-07-07 DIAGNOSIS — M9903 Segmental and somatic dysfunction of lumbar region: Secondary | ICD-10-CM | POA: Diagnosis not present

## 2018-07-07 DIAGNOSIS — M9904 Segmental and somatic dysfunction of sacral region: Secondary | ICD-10-CM | POA: Diagnosis not present

## 2018-07-07 DIAGNOSIS — M9902 Segmental and somatic dysfunction of thoracic region: Secondary | ICD-10-CM | POA: Diagnosis not present

## 2018-07-07 DIAGNOSIS — M62830 Muscle spasm of back: Secondary | ICD-10-CM | POA: Diagnosis not present

## 2018-07-09 DIAGNOSIS — M9904 Segmental and somatic dysfunction of sacral region: Secondary | ICD-10-CM | POA: Diagnosis not present

## 2018-07-09 DIAGNOSIS — M9903 Segmental and somatic dysfunction of lumbar region: Secondary | ICD-10-CM | POA: Diagnosis not present

## 2018-07-09 DIAGNOSIS — M62830 Muscle spasm of back: Secondary | ICD-10-CM | POA: Diagnosis not present

## 2018-07-09 DIAGNOSIS — M9902 Segmental and somatic dysfunction of thoracic region: Secondary | ICD-10-CM | POA: Diagnosis not present

## 2018-07-10 DIAGNOSIS — M62830 Muscle spasm of back: Secondary | ICD-10-CM | POA: Diagnosis not present

## 2018-07-10 DIAGNOSIS — M9904 Segmental and somatic dysfunction of sacral region: Secondary | ICD-10-CM | POA: Diagnosis not present

## 2018-07-10 DIAGNOSIS — M9902 Segmental and somatic dysfunction of thoracic region: Secondary | ICD-10-CM | POA: Diagnosis not present

## 2018-07-10 DIAGNOSIS — M9903 Segmental and somatic dysfunction of lumbar region: Secondary | ICD-10-CM | POA: Diagnosis not present

## 2018-08-11 ENCOUNTER — Ambulatory Visit: Payer: BLUE CROSS/BLUE SHIELD | Admitting: Family Medicine

## 2018-08-12 ENCOUNTER — Other Ambulatory Visit: Payer: Self-pay

## 2018-08-12 ENCOUNTER — Ambulatory Visit: Payer: BLUE CROSS/BLUE SHIELD | Admitting: Family Medicine

## 2018-08-12 ENCOUNTER — Encounter: Payer: Self-pay | Admitting: Family Medicine

## 2018-08-12 VITALS — BP 149/76 | HR 67 | Temp 98.5°F | Ht 61.0 in | Wt 122.0 lb

## 2018-08-12 DIAGNOSIS — Z8673 Personal history of transient ischemic attack (TIA), and cerebral infarction without residual deficits: Secondary | ICD-10-CM

## 2018-08-12 DIAGNOSIS — Z7901 Long term (current) use of anticoagulants: Secondary | ICD-10-CM

## 2018-08-12 DIAGNOSIS — D6851 Activated protein C resistance: Secondary | ICD-10-CM

## 2018-08-12 LAB — COAGUCHEK XS/INR WAIVED
INR: 2.3 — ABNORMAL HIGH (ref 0.9–1.1)
Prothrombin Time: 27.1 s

## 2018-08-12 NOTE — Progress Notes (Signed)
Subjective: CC: Factor V Leiden PCP: [name], DO HPI:[name] is a 63 y.o. female presenting to clinic today for:  1. Factor V Leiden History: Factor V Leyden.  She sustained 2 clots in her lifetime, once in 2004 and once in 2006.  She reports both of these were cerebral clots.  She is on chronic anticoagulation with warfarin.  Goal INR is 2-3.    Last INR was supratherapeutic at 3.5 on 07/04/2018.  She reports compliance with Warfarin '10mg'$  daily.  She canceled her colonoscopy that was scheduled for the end of March.  She does have the bridge therapy of Lovenox 1 mg/kg twice daily at home and this will be good for the next year.  She plans on rescheduling it for later in the year. She denies any abnormal bleeding. no hematochezia, melena. no blood in urine, no vaginal bleeding, no bleeding with brushing teeth.  No chest pain, shortness of breath, lower extremity swelling, calf pain.   ROS: Per HPI  Allergies  Allergen Reactions  . Codeine   . Donnatal [Belladonna Alk-Phenobarb Er]   . Librium    Past Medical History:  Diagnosis Date  . Anemia     Current Outpatient Medications:  .  diclofenac sodium (VOLTAREN) 1 % GEL, APPLY 1 GRAM TO AFFECTED AREA TWICE DAILY AS NEEDED, Disp: 100 g, Rfl: 2 .  ESTRACE VAGINAL 0.1 MG/GM vaginal cream, Apply vaginally as directed, Disp: , Rfl:  .  fluticasone (FLONASE) 50 MCG/ACT nasal spray, Place 2 sprays into the nose daily. Reported on 05/13/2015, Disp: , Rfl:  .  ibandronate (BONIVA) 150 MG tablet, Take 150 mg by mouth every 30 (thirty) days. Take in the morning with a full glass of water, on an empty stomach, and do not take anything else by mouth or lie down for the next 30 min., Disp: , Rfl:  .  Ketoprofen 10 % CREA, Apply to area of pain 1 to 2 times per day., Disp: 60 g, Rfl: 0 .  loratadine (CLARITIN) 10 MG tablet, Take 1 tablet (10 mg total) by mouth daily., Disp: 30 tablet, Rfl: 1 .  warfarin (COUMADIN) 5 MG tablet, TAKE  1 TO 2 TABLETS DAILY AS DIRECTED, Disp: 180 tablet, Rfl: 1 Social History   Socioeconomic History  . Marital status: Married    Spouse name: Not on file  . Number of children: Not on file  . Years of education: Not on file  . Highest education level: Not on file  Occupational History  . Not on file  Social Needs  . Financial resource strain: Not on file  . Food insecurity:    Worry: Not on file    Inability: Not on file  . Transportation needs:    Medical: Not on file    Non-medical: Not on file  Tobacco Use  . Smoking status: Never Smoker  . Smokeless tobacco: Never Used  Substance and Sexual Activity  . Alcohol use: Not on file  . Drug use: Not on file  . Sexual activity: Not on file  Lifestyle  . Physical activity:    Days per week: Not on file    Minutes per session: Not on file  . Stress: Not on file  Relationships  . Social connections:    Talks on phone: Not on file    Gets together: Not on file    Attends religious service: Not on file    Active member of club or organization: Not on  file    Attends meetings of clubs or organizations: Not on file    Relationship status: Not on file  . Intimate partner violence:    Fear of current or ex partner: Not on file    Emotionally abused: Not on file    Physically abused: Not on file    Forced sexual activity: Not on file  Other Topics Concern  . Not on file  Social History Narrative  . Not on file   No family history on file.  Objective: Office vital signs reviewed. BP (!) 149/76   Pulse 67   Temp 98.5 F (36.9 C) (Oral)   Ht '5\' 1"'$  (1.549 m)   Wt 122 lb (55.3 kg)   BMI 23.05 kg/m   Physical Examination:  General: Awake, alert, well nourished, No acute distress HEENT: Normal, sclera white, MMM Cardio: regular rate and rhythm, S1S2 heard, no murmurs appreciated Pulm: clear to auscultation bilaterally, no wheezes, rhonchi or rales; normal work of breathing on room air Extremity: Warm, well-perfused, no  edema.  Assessment/ Plan: 63 y.o. female   1. Factor V Leiden mutation ([place]) INR therapeutic at 2.3 today. Continue Warfarin '10mg'$  daily.  Recheck in 1 month.  Will attempt to arrange home INR testing given stability.  Patient and her husband have the cognitive ability and agility to perform at home tests.   - CoaguChek XS/INR Waived  2. H/O: CVA (cerebrovascular accident)  3. Warfarin anticoagulation   Orders Placed This Encounter  Procedures  . CoaguChek XS/INR Waived   No orders of the defined types were placed in this encounter.    [name], DO [place] [phone]

## 2018-08-20 ENCOUNTER — Other Ambulatory Visit: Payer: Self-pay | Admitting: Pediatrics

## 2018-08-28 ENCOUNTER — Telehealth: Payer: Self-pay | Admitting: Family Medicine

## 2018-08-28 NOTE — Telephone Encounter (Signed)
Pt has already made an appt for May 7 for protime.

## 2018-09-24 ENCOUNTER — Other Ambulatory Visit: Payer: Self-pay

## 2018-09-25 ENCOUNTER — Encounter: Payer: Self-pay | Admitting: Family Medicine

## 2018-09-25 ENCOUNTER — Ambulatory Visit: Payer: BLUE CROSS/BLUE SHIELD | Admitting: Family Medicine

## 2018-09-25 VITALS — BP 143/87 | HR 66 | Temp 98.1°F | Ht 61.0 in | Wt 121.8 lb

## 2018-09-25 DIAGNOSIS — D6851 Activated protein C resistance: Secondary | ICD-10-CM

## 2018-09-25 LAB — COAGUCHEK XS/INR WAIVED
INR: 1.7 — ABNORMAL HIGH (ref 0.9–1.1)
Prothrombin Time: 20.4 s

## 2018-09-25 NOTE — Progress Notes (Signed)
Subjective: CC: Factor V Leiden PCP: Emily Norlander, DO HPI:[name] is a 63 y.o. female presenting to clinic today for:  1. Factor V Leiden History: Factor V Leyden.  She sustained 2 clots in her lifetime, once in 2004 and once in 2006.  She reports both of these were cerebral clots.  She is on chronic anticoagulation with warfarin.  Goal INR is 2-3.    Last INR was therapeutic 1 month ago.  She reports compliance with Warfarin '10mg'$  daily.  She did have a smoothie recently which may have contained greens.  No chest pain, shortness of breath, lower extremity swelling or calf pain.   ROS: Per HPI  Allergies  Allergen Reactions  . Codeine   . Donnatal [Belladonna Alk-Phenobarb Er]   . Librium    Past Medical History:  Diagnosis Date  . Anemia     Current Outpatient Medications:  .  diclofenac sodium (VOLTAREN) 1 % GEL, APPLY 1 GRAM TO AFFECTED AREA TWICE DAILY AS NEEDED, Disp: 100 g, Rfl: 2 .  ESTRACE VAGINAL 0.1 MG/GM vaginal cream, Apply vaginally as directed, Disp: , Rfl:  .  fluticasone (FLONASE) 50 MCG/ACT nasal spray, Place 2 sprays into the nose daily. Reported on 05/13/2015, Disp: , Rfl:  .  ibandronate (BONIVA) 150 MG tablet, Take 150 mg by mouth every 30 (thirty) days. Take in the morning with a full glass of water, on an empty stomach, and do not take anything else by mouth or lie down for the next 30 min., Disp: , Rfl:  .  Ketoprofen 10 % CREA, Apply to area of pain 1 to 2 times per day., Disp: 60 g, Rfl: 0 .  loratadine (CLARITIN) 10 MG tablet, Take 1 tablet (10 mg total) by mouth daily., Disp: 30 tablet, Rfl: 1 .  warfarin (COUMADIN) 5 MG tablet, TAKE 1 TO 2 TABLETS DAILY AS DIRECTED, Disp: 180 tablet, Rfl: 1 Social History   Socioeconomic History  . Marital status: Married    Spouse name: Not on file  . Number of children: Not on file  . Years of education: Not on file  . Highest education level: Not on file  Occupational History  . Not on file   Social Needs  . Financial resource strain: Not on file  . Food insecurity:    Worry: Not on file    Inability: Not on file  . Transportation needs:    Medical: Not on file    Non-medical: Not on file  Tobacco Use  . Smoking status: Never Smoker  . Smokeless tobacco: Never Used  Substance and Sexual Activity  . Alcohol use: Not Currently    Alcohol/week: 0.0 standard drinks  . Drug use: Not Currently  . Sexual activity: Not on file  Lifestyle  . Physical activity:    Days per week: Not on file    Minutes per session: Not on file  . Stress: Not on file  Relationships  . Social connections:    Talks on phone: Not on file    Gets together: Not on file    Attends religious service: Not on file    Active member of club or organization: Not on file    Attends meetings of clubs or organizations: Not on file    Relationship status: Not on file  . Intimate partner violence:    Fear of current or ex partner: Not on file    Emotionally abused: Not on file    Physically abused:  Not on file    Forced sexual activity: Not on file  Other Topics Concern  . Not on file  Social History Narrative  . Not on file   History reviewed. No pertinent family history.  Objective: Office vital signs reviewed. BP (!) 143/87   Pulse 66   Temp 98.1 F (36.7 C) (Oral)   Ht '5\' 1"'$  (1.549 m)   Wt 121 lb 12.8 oz (55.2 kg)   BMI 23.01 kg/m   Physical Examination:  General: Awake, alert, well nourished, No acute distress Cardio: regular rate and rhythm, S1S2 heard, no murmurs appreciated Pulm: clear to auscultation bilaterally, no wheezes, rhonchi or rales; normal work of breathing on room air Extremity: Warm, well-perfused, no edema.  Assessment/ Plan: 63 y.o. female   1. Factor V Leiden mutation ([place]) INR subtherapeutic at 1.7 today.  Have advised her to increase her dose to 15 mg today then resume 10 mg dosing daily.  I suspect that this change in INR is likely related to recent consumption  of greens.  She may follow-up in 1 month, sooner if needed. - CoaguChek XS/INR Waived; Standing - CoaguChek XS/INR Waived   Orders Placed This Encounter  Procedures  . CoaguChek XS/INR Waived    Standing Status:   Standing    Number of Occurrences:   24    Standing Expiration Date:   09/25/2019   No orders of the defined types were placed in this encounter.    Emily Norlander, DO [place] [phone]

## 2018-09-30 DIAGNOSIS — M2041 Other hammer toe(s) (acquired), right foot: Secondary | ICD-10-CM | POA: Diagnosis not present

## 2018-09-30 DIAGNOSIS — L84 Corns and callosities: Secondary | ICD-10-CM | POA: Diagnosis not present

## 2018-10-28 DIAGNOSIS — Z01419 Encounter for gynecological examination (general) (routine) without abnormal findings: Secondary | ICD-10-CM | POA: Diagnosis not present

## 2018-10-28 DIAGNOSIS — Z6822 Body mass index (BMI) 22.0-22.9, adult: Secondary | ICD-10-CM | POA: Diagnosis not present

## 2018-10-28 DIAGNOSIS — Z1231 Encounter for screening mammogram for malignant neoplasm of breast: Secondary | ICD-10-CM | POA: Diagnosis not present

## 2018-10-28 DIAGNOSIS — M810 Age-related osteoporosis without current pathological fracture: Secondary | ICD-10-CM | POA: Diagnosis not present

## 2018-11-04 ENCOUNTER — Other Ambulatory Visit: Payer: Self-pay

## 2018-11-05 ENCOUNTER — Ambulatory Visit: Payer: BLUE CROSS/BLUE SHIELD | Admitting: Family Medicine

## 2018-11-05 ENCOUNTER — Encounter: Payer: Self-pay | Admitting: Family Medicine

## 2018-11-05 VITALS — BP 144/96 | HR 68 | Ht 61.0 in | Wt 123.0 lb

## 2018-11-05 DIAGNOSIS — I10 Essential (primary) hypertension: Secondary | ICD-10-CM

## 2018-11-05 DIAGNOSIS — D6851 Activated protein C resistance: Secondary | ICD-10-CM

## 2018-11-05 DIAGNOSIS — Z23 Encounter for immunization: Secondary | ICD-10-CM | POA: Diagnosis not present

## 2018-11-05 LAB — COAGUCHEK XS/INR WAIVED
INR: 1.8 — ABNORMAL HIGH (ref 0.9–1.1)
Prothrombin Time: 22.1 s

## 2018-11-05 MED ORDER — WARFARIN SODIUM 5 MG PO TABS: ORAL_TABLET | ORAL | 1 refills | Status: DC

## 2018-11-05 MED ORDER — AMLODIPINE BESYLATE 5 MG PO TABS: 5.0000 mg | ORAL_TABLET | Freq: Every day | ORAL | 0 refills | Status: DC

## 2018-11-05 NOTE — Patient Instructions (Addendum)
Increase the coumadin to 15mg  every Wednesday and 10mg  every day all other days.   We are starting amlodipine 5mg  today for blood pressure. Return in 1 week for coumadin check and BP check.   Hypertension Hypertension, commonly called high blood pressure, is when the force of blood pumping through the arteries is too strong. The arteries are the blood vessels that carry blood from the heart throughout the body. Hypertension forces the heart to work harder to pump blood and may cause arteries to become narrow or stiff. Having untreated or uncontrolled hypertension can cause heart attacks, strokes, kidney disease, and other problems. A blood pressure reading consists of a higher number over a lower number. Ideally, your blood pressure should be below 120/80. The first ("top") number is called the systolic pressure. It is a measure of the pressure in your arteries as your heart beats. The second ("bottom") number is called the diastolic pressure. It is a measure of the pressure in your arteries as the heart relaxes. What are the causes? The cause of this condition is not known. What increases the risk? Some risk factors for high blood pressure are under your control. Others are not. Factors you can change  Smoking.  Having type 2 diabetes mellitus, high cholesterol, or both.  Not getting enough exercise or physical activity.  Being overweight.  Having too much fat, sugar, calories, or salt (sodium) in your diet.  Drinking too much alcohol. Factors that are difficult or impossible to change  Having chronic kidney disease.  Having a family history of high blood pressure.  Age. Risk increases with age.  Race. You may be at higher risk if you are African-American.  Gender. Men are at higher risk than women before age 8. After age 89, women are at higher risk than men.  Having obstructive sleep apnea.  Stress. What are the signs or symptoms? Extremely high blood pressure  (hypertensive crisis) may cause:  Headache.  Anxiety.  Shortness of breath.  Nosebleed.  Nausea and vomiting.  Severe chest pain.  Jerky movements you cannot control (seizures). How is this diagnosed? This condition is diagnosed by measuring your blood pressure while you are seated, with your arm resting on a surface. The cuff of the blood pressure monitor will be placed directly against the skin of your upper arm at the level of your heart. It should be measured at least twice using the same arm. Certain conditions can cause a difference in blood pressure between your right and left arms. Certain factors can cause blood pressure readings to be lower or higher than normal (elevated) for a short period of time:  When your blood pressure is higher when you are in a health care provider's office than when you are at home, this is called white coat hypertension. Most people with this condition do not need medicines.  When your blood pressure is higher at home than when you are in a health care provider's office, this is called masked hypertension. Most people with this condition may need medicines to control blood pressure. If you have a high blood pressure reading during one visit or you have normal blood pressure with other risk factors:  You may be asked to return on a different day to have your blood pressure checked again.  You may be asked to monitor your blood pressure at home for 1 week or longer. If you are diagnosed with hypertension, you may have other blood or imaging tests to help your health care  provider understand your overall risk for other conditions. How is this treated? This condition is treated by making healthy lifestyle changes, such as eating healthy foods, exercising more, and reducing your alcohol intake. Your health care provider may prescribe medicine if lifestyle changes are not enough to get your blood pressure under control, and if:  Your systolic blood  pressure is above 130.  Your diastolic blood pressure is above 80. Your personal target blood pressure may vary depending on your medical conditions, your age, and other factors. Follow these instructions at home: Eating and drinking   Eat a diet that is high in fiber and potassium, and low in sodium, added sugar, and fat. An example eating plan is called the DASH (Dietary Approaches to Stop Hypertension) diet. To eat this way: ? Eat plenty of fresh fruits and vegetables. Try to fill half of your plate at each meal with fruits and vegetables. ? Eat whole grains, such as whole wheat pasta, brown rice, or whole grain bread. Fill about one quarter of your plate with whole grains. ? Eat or drink low-fat dairy products, such as skim milk or low-fat yogurt. ? Avoid fatty cuts of meat, processed or cured meats, and poultry with skin. Fill about one quarter of your plate with lean proteins, such as fish, chicken without skin, beans, eggs, and tofu. ? Avoid premade and processed foods. These tend to be higher in sodium, added sugar, and fat.  Reduce your daily sodium intake. Most people with hypertension should eat less than 1,500 mg of sodium a day.  Limit alcohol intake to no more than 1 drink a day for nonpregnant women and 2 drinks a day for men. One drink equals 12 oz of beer, 5 oz of wine, or 1 oz of hard liquor. Lifestyle   Work with your health care provider to maintain a healthy body weight or to lose weight. Ask what an ideal weight is for you.  Get at least 30 minutes of exercise that causes your heart to beat faster (aerobic exercise) most days of the week. Activities may include walking, swimming, or biking.  Include exercise to strengthen your muscles (resistance exercise), such as pilates or lifting weights, as part of your weekly exercise routine. Try to do these types of exercises for 30 minutes at least 3 days a week.  Do not use any products that contain nicotine or tobacco,  such as cigarettes and e-cigarettes. If you need help quitting, ask your health care provider.  Monitor your blood pressure at home as told by your health care provider.  Keep all follow-up visits as told by your health care provider. This is important. Medicines  Take over-the-counter and prescription medicines only as told by your health care provider. Follow directions carefully. Blood pressure medicines must be taken as prescribed.  Do not skip doses of blood pressure medicine. Doing this puts you at risk for problems and can make the medicine less effective.  Ask your health care provider about side effects or reactions to medicines that you should watch for. Contact a health care provider if:  You think you are having a reaction to a medicine you are taking.  You have headaches that keep coming back (recurring).  You feel dizzy.  You have swelling in your ankles.  You have trouble with your vision. Get help right away if:  You develop a severe headache or confusion.  You have unusual weakness or numbness.  You feel faint.  You have severe pain  in your chest or abdomen.  You vomit repeatedly.  You have trouble breathing. Summary  Hypertension is when the force of blood pumping through your arteries is too strong. If this condition is not controlled, it may put you at risk for serious complications.  Your personal target blood pressure may vary depending on your medical conditions, your age, and other factors. For most people, a normal blood pressure is less than 120/80.  Hypertension is treated with lifestyle changes, medicines, or a combination of both. Lifestyle changes include weight loss, eating a healthy, low-sodium diet, exercising more, and limiting alcohol. This information is not intended to replace advice given to you by your health care provider. Make sure you discuss any questions you have with your health care provider. Document Released: 05/07/2005  Document Revised: 04/04/2016 Document Reviewed: 04/04/2016 Elsevier Interactive Patient Education  2019 [employer].

## 2018-11-05 NOTE — Progress Notes (Signed)
 Subjective: CC: Factor V Leiden PCP: Gottschalk, Ashly M, DO HPI:[name] is a 62 y.o. female presenting to clinic today for:  1. Factor V Leiden History: Factor V Leyden.  She sustained 2 clots in her lifetime, once in 2004 and once in 2006.  She reports both of these were cerebral clots.  She is on chronic anticoagulation with warfarin.  Goal INR is 2-3.    Last INR was subtherapeutic 1 month ago.  She was instructed to increase her dose to 15 mg for 1 day then resume 10 mg dosing of warfarin daily.  She reports compliance with Warfarin 10mg daily.  Denies chest pain, shortness of breath, lower extremity swelling or calf pain.  Had some lettuce recently but nothing where she is having regular greens.  2.  Elevated blood pressure without diagnosis of hypertension Patient has had multiple elevated blood pressures here in office.  She denies any chest pain, shortness of breath, edema.  She does report family history of hypertension in her father.  She is never been treated for high blood pressure.  She maintains a low-salt, healthy diet and is physically active.   ROS: Per HPI  Allergies  Allergen Reactions  . Codeine   . Donnatal [Belladonna Alk-Phenobarb Er]   . Librium    Past Medical History:  Diagnosis Date  . Anemia     Current Outpatient Medications:  .  diclofenac sodium (VOLTAREN) 1 % GEL, APPLY 1 GRAM TO AFFECTED AREA TWICE DAILY AS NEEDED, Disp: 100 g, Rfl: 2 .  ESTRACE VAGINAL 0.1 MG/GM vaginal cream, Apply vaginally as directed, Disp: , Rfl:  .  fluticasone (FLONASE) 50 MCG/ACT nasal spray, Place 2 sprays into the nose daily. Reported on 05/13/2015, Disp: , Rfl:  .  ibandronate (BONIVA) 150 MG tablet, Take 150 mg by mouth every 30 (thirty) days. Take in the morning with a full glass of water, on an empty stomach, and do not take anything else by mouth or lie down for the next 30 min., Disp: , Rfl:  .  Ketoprofen 10 % CREA, Apply to area of pain 1 to 2 times per  day., Disp: 60 g, Rfl: 0 .  loratadine (CLARITIN) 10 MG tablet, Take 1 tablet (10 mg total) by mouth daily., Disp: 30 tablet, Rfl: 1 .  warfarin (COUMADIN) 5 MG tablet, TAKE 1 TO 2 TABLETS DAILY AS DIRECTED, Disp: 180 tablet, Rfl: 1 Social History   Socioeconomic History  . Marital status: Married    Spouse name: Not on file  . Number of children: Not on file  . Years of education: Not on file  . Highest education level: Not on file  Occupational History  . Not on file  Social Needs  . Financial resource strain: Not on file  . Food insecurity    Worry: Not on file    Inability: Not on file  . Transportation needs    Medical: Not on file    Non-medical: Not on file  Tobacco Use  . Smoking status: Never Smoker  . Smokeless tobacco: Never Used  Substance and Sexual Activity  . Alcohol use: Not Currently    Alcohol/week: 0.0 standard drinks  . Drug use: Not Currently  . Sexual activity: Not on file  Lifestyle  . Physical activity    Days per week: Not on file    Minutes per session: Not on file  . Stress: Not on file  Relationships  . Social connections      Talks on phone: Not on file    Gets together: Not on file    Attends religious service: Not on file    Active member of club or organization: Not on file    Attends meetings of clubs or organizations: Not on file    Relationship status: Not on file  . Intimate partner violence    Fear of current or ex partner: Not on file    Emotionally abused: Not on file    Physically abused: Not on file    Forced sexual activity: Not on file  Other Topics Concern  . Not on file  Social History Narrative  . Not on file   No family history on file.  Objective: Office vital signs reviewed. BP (!) 144/96 (BP Location: Other (Comment), Patient Position: Sitting) Comment (BP Location): manual  Pulse 68   Ht 5' 1" (1.549 m)   Wt 123 lb (55.8 kg)   BMI 23.24 kg/m   Physical Examination:  General: Awake, alert, well nourished,  No acute distress HEENT: PERRL, EOMI, sclera white, MMM Cardio: regular rate and rhythm, S1S2 heard, no murmurs appreciated Pulm: clear to auscultation bilaterally, no wheezes, rhonchi or rales; normal work of breathing on room air Extremity: Warm, well-perfused, no edema.  Assessment/ Plan: 63 y.o. female   1. Factor V Leiden mutation ([place]) We will increase her Coumadin to 15 mg every Wednesday and 10 mg every day all other days of the week.  She will return in 1 week for INR recheck.  If INR is therapeutic at 2 to 3 can plan to continue this regimen. - CoaguChek XS/INR Waived  2. Essential hypertension She has had elevated blood pressures above 140/90 on 4 separate occasions now start Norvasc 5 mg daily.  She will follow-up in 1 week for blood pressure check and INR repeat as above.   Orders Placed This Encounter  Procedures  . Varicella-zoster vaccine IM (Shingrix)   Meds ordered this encounter  Medications  . amLODipine (NORVASC) 5 MG tablet    Sig: Take 1 tablet (5 mg total) by mouth daily. (for blood pressure)    Dispense:  90 tablet    Refill:  0  . warfarin (COUMADIN) 5 MG tablet    Sig: Take 68m Wednesdays and 135mdaily all other days    Dispense:  180 tablet    Ref[place]:  1 [id number]

## 2018-11-10 ENCOUNTER — Telehealth: Payer: Self-pay | Admitting: Family Medicine

## 2018-11-10 NOTE — Telephone Encounter (Signed)
Per last visit patient was to take 15mg  every wed- patient aware and verbalizes understanding.

## 2018-11-13 ENCOUNTER — Telehealth: Payer: Self-pay | Admitting: Family Medicine

## 2018-11-13 ENCOUNTER — Other Ambulatory Visit: Payer: Self-pay | Admitting: Family Medicine

## 2018-11-13 DIAGNOSIS — I10 Essential (primary) hypertension: Secondary | ICD-10-CM

## 2018-11-13 MED ORDER — LISINOPRIL 5 MG PO TABS: 5.0000 mg | ORAL_TABLET | Freq: Every day | ORAL | 3 refills | Status: DC

## 2018-11-13 NOTE — Telephone Encounter (Signed)
I've replaced medication with Lisinopril.  Will need BMP with her INR check next week.  Please place a future order for this.

## 2018-11-18 ENCOUNTER — Other Ambulatory Visit: Payer: Self-pay

## 2018-11-19 ENCOUNTER — Ambulatory Visit: Payer: BC Managed Care – PPO | Admitting: Family Medicine

## 2018-11-19 ENCOUNTER — Encounter: Payer: Self-pay | Admitting: Family Medicine

## 2018-11-19 VITALS — BP 125/78 | HR 65 | Temp 97.0°F | Ht 61.0 in | Wt 122.4 lb

## 2018-11-19 DIAGNOSIS — D6851 Activated protein C resistance: Secondary | ICD-10-CM

## 2018-11-19 DIAGNOSIS — I10 Essential (primary) hypertension: Secondary | ICD-10-CM

## 2018-11-19 LAB — COAGUCHEK XS/INR WAIVED
INR: 1.4 — ABNORMAL HIGH (ref 0.9–1.1)
Prothrombin Time: 16.7 s

## 2018-11-19 MED ORDER — WARFARIN SODIUM 5 MG PO TABS: ORAL_TABLET | ORAL | 1 refills | Status: DC

## 2018-11-19 NOTE — Progress Notes (Signed)
Subjective: CC: Factor V Leiden PCP: [name], DO HPI:[name] is a 63 y.o. female presenting to clinic today for:  1. Factor V Leiden History: Factor V Leyden.  She sustained 2 clots in her lifetime, once in 2004 and once in 2006.  She reports both of these were cerebral clots.  She is on chronic anticoagulation with warfarin.  Goal INR is 2-3.    Last INR was subtherapeutic 2 weeks ago.  She has been taking 15 mg every Wednesday and 10 mg all other days.  No recent consumption of leafy greens.  No new supplements.  Denies chest pain, shortness of breath, lower extremity swelling or calf pain.  No neurologic changes.  2.  Hypertension Started on Norvasc at last visit but had intolerance and therefore this was replaced with lisinopril last week.  She is tolerating this new medication without difficulty.  No chest pain, shortness of breath, lower extremity edema.   ROS: Per HPI  Allergies  Allergen Reactions  . Codeine   . Donnatal [Belladonna Alk-Phenobarb Er]   . Librium    Past Medical History:  Diagnosis Date  . Anemia     Current Outpatient Medications:  .  diclofenac sodium (VOLTAREN) 1 % GEL, APPLY 1 GRAM TO AFFECTED AREA TWICE DAILY AS NEEDED, Disp: 100 g, Rfl: 2 .  ESTRACE VAGINAL 0.1 MG/GM vaginal cream, Apply vaginally as directed, Disp: , Rfl:  .  fluticasone (FLONASE) 50 MCG/ACT nasal spray, Place 2 sprays into the nose daily. Reported on 05/13/2015, Disp: , Rfl:  .  ibandronate (BONIVA) 150 MG tablet, Take 150 mg by mouth every 30 (thirty) days. Take in the morning with a full glass of water, on an empty stomach, and do not take anything else by mouth or lie down for the next 30 min., Disp: , Rfl:  .  Ketoprofen 10 % CREA, Apply to area of pain 1 to 2 times per day., Disp: 60 g, Rfl: 0 .  lisinopril (ZESTRIL) 5 MG tablet, Take 1 tablet (5 mg total) by mouth daily., Disp: 90 tablet, Rfl: 3 .  loratadine (CLARITIN) 10 MG tablet, Take 1 tablet (10 mg  total) by mouth daily., Disp: 30 tablet, Rfl: 1 .  warfarin (COUMADIN) 5 MG tablet, Take '15mg'$  Wednesdays and '10mg'$  daily all other days, Disp: 180 tablet, Rfl: 1 Social History   Socioeconomic History  . Marital status: Married    Spouse name: Not on file  . Number of children: Not on file  . Years of education: Not on file  . Highest education level: Not on file  Occupational History  . Not on file  Social Needs  . Financial resource strain: Not on file  . Food insecurity    Worry: Not on file    Inability: Not on file  . Transportation needs    Medical: Not on file    Non-medical: Not on file  Tobacco Use  . Smoking status: Never Smoker  . Smokeless tobacco: Never Used  Substance and Sexual Activity  . Alcohol use: Not Currently    Alcohol/week: 0.0 standard drinks  . Drug use: Not Currently  . Sexual activity: Not on file  Lifestyle  . Physical activity    Days per week: Not on file    Minutes per session: Not on file  . Stress: Not on file  Relationships  . Social Herbalist on phone: Not on file    Gets together: Not  on file    Attends religious service: Not on file    Active member of club or organization: Not on file    Attends meetings of clubs or organizations: Not on file    Relationship status: Not on file  . Intimate partner violence    Fear of current or ex partner: Not on file    Emotionally abused: Not on file    Physically abused: Not on file    Forced sexual activity: Not on file  Other Topics Concern  . Not on file  Social History Narrative  . Not on file   History reviewed. No pertinent family history.  Objective: Office vital signs reviewed. BP 125/78   Pulse 65   Temp (!) 97 F (36.1 C) (Oral)   Ht '5\' 1"'$  (1.549 m)   Wt 122 lb 6.4 oz (55.5 kg)   BMI 23.13 kg/m   Physical Examination:  General: Awake, alert, well nourished, No acute distress HEENT: PERRL, EOMI, sclera white, MMM Cardio: regular rate and rhythm, S1S2 heard,  no murmurs appreciated Pulm: clear to auscultation bilaterally, no wheezes, rhonchi or rales; normal work of breathing on room air Extremity: Warm, well-perfused, no edema.  Assessment/ Plan: 63 y.o. female   1. Factor V Leiden mutation ([place]) We will increase her Coumadin to 15 mg every Wednesday and Friday with 10 mg every day all other days of the week.  She will return in 1 week for INR recheck.  If INR is therapeutic at 2 to 3 can plan to continue this regimen.  Otherwise, we will need to continue increasing.  She is not demonstrating any red flag signs or symptoms - CoaguChek XS/INR Waived  2. Essential hypertension Under excellent control with addition of lisinopril 5 mg daily.  Plan for BMP with next lab draw.  Unfortunately this was not obtained during her check today.  Orders Placed This Encounter  Procedures  . CoaguChek XS/INR Waived   Meds ordered this encounter  Medications  . warfarin (COUMADIN) 5 MG tablet    Sig: Take '15mg'$  Wednesdays and Fridays w/ '10mg'$  daily all other days    Dispense:  180 tablet    Refill:  1    Please replace last rx.     [name], DO [place] [phone]

## 2018-12-01 ENCOUNTER — Telehealth: Payer: Self-pay | Admitting: Family Medicine

## 2018-12-01 MED ORDER — HYDROCHLOROTHIAZIDE 12.5 MG PO TABS: 12.5000 mg | ORAL_TABLET | Freq: Every day | ORAL | 1 refills | Status: DC

## 2018-12-01 NOTE — Telephone Encounter (Signed)
Patient aware and medication change sent to pharmacy.  

## 2018-12-01 NOTE — Telephone Encounter (Signed)
I am not aware of weight gain as a typical side effect of Lisinopril.  However, if she is not tolerating the medication and she wants to switch to a diuretic we can.  Can replace with HCTZ 12.5mg  daily

## 2018-12-04 ENCOUNTER — Other Ambulatory Visit: Payer: Self-pay

## 2018-12-05 ENCOUNTER — Encounter: Payer: Self-pay | Admitting: Family Medicine

## 2018-12-05 ENCOUNTER — Ambulatory Visit: Payer: BC Managed Care – PPO | Admitting: Family Medicine

## 2018-12-05 DIAGNOSIS — I10 Essential (primary) hypertension: Secondary | ICD-10-CM

## 2018-12-05 DIAGNOSIS — D6851 Activated protein C resistance: Secondary | ICD-10-CM

## 2018-12-05 LAB — COAGUCHEK XS/INR WAIVED
INR: 1.8 — ABNORMAL HIGH (ref 0.9–1.1)
Prothrombin Time: 21.9 s

## 2018-12-05 MED ORDER — WARFARIN SODIUM 5 MG PO TABS: ORAL_TABLET | ORAL | 1 refills | Status: DC

## 2018-12-05 NOTE — Progress Notes (Signed)
 Subjective: CC: Factor V Leiden PCP: Gottschalk, Ashly M, DO HPI:[name] is a 62 y.o. female presenting to clinic today for:  1. Factor V Leiden History: Factor V Leyden.  She sustained 2 clots in her lifetime, once in 2004 and once in 2006.  She reports both of these were cerebral clots.  She is on chronic anticoagulation with warfarin.  Goal INR is 2-3.    Last INR was subtherapeutic 2 weeks ago at 1.4.  She has been taking 15 mg every Wednesday/ Fridays and 10 mg all other days.  No recent consumption of leafy greens. Denies chest pain, shortness of breath, lower extremity swelling or calf pain.  No neurologic changes.  2.  Hypertension She reports that she started having hand swelling on the lisinopril and therefore we switched her over to hydrochlorothiazide 12.5 mg daily.  She notes that the hand swelling has totally resolved.  Urine output has remained normal.  She overall is feeling her normal self.  No chest pain, shortness of breath, lower extremity edema.   ROS: Per HPI  Allergies  Allergen Reactions  . Codeine   . Donnatal [Belladonna Alk-Phenobarb Er]   . Librium    Past Medical History:  Diagnosis Date  . Anemia     Current Outpatient Medications:  .  diclofenac sodium (VOLTAREN) 1 % GEL, APPLY 1 GRAM TO AFFECTED AREA TWICE DAILY AS NEEDED, Disp: 100 g, Rfl: 2 .  ESTRACE VAGINAL 0.1 MG/GM vaginal cream, Apply vaginally as directed, Disp: , Rfl:  .  fluticasone (FLONASE) 50 MCG/ACT nasal spray, Place 2 sprays into the nose daily. Reported on 05/13/2015, Disp: , Rfl:  .  hydrochlorothiazide (HYDRODIURIL) 12.5 MG tablet, Take 1 tablet (12.5 mg total) by mouth daily., Disp: 90 tablet, Rfl: 1 .  ibandronate (BONIVA) 150 MG tablet, Take 150 mg by mouth every 30 (thirty) days. Take in the morning with a full glass of water, on an empty stomach, and do not take anything else by mouth or lie down for the next 30 min., Disp: , Rfl:  .  Ketoprofen 10 % CREA, Apply to  area of pain 1 to 2 times per day., Disp: 60 g, Rfl: 0 .  loratadine (CLARITIN) 10 MG tablet, Take 1 tablet (10 mg total) by mouth daily., Disp: 30 tablet, Rfl: 1 .  warfarin (COUMADIN) 5 MG tablet, Take 15mg Wednesdays and Fridays w/ 10mg daily all other days, Disp: 180 tablet, Rfl: 1 Social History   Socioeconomic History  . Marital status: Married    Spouse name: Not on file  . Number of children: Not on file  . Years of education: Not on file  . Highest education level: Not on file  Occupational History  . Not on file  Social Needs  . Financial resource strain: Not on file  . Food insecurity    Worry: Not on file    Inability: Not on file  . Transportation needs    Medical: Not on file    Non-medical: Not on file  Tobacco Use  . Smoking status: Never Smoker  . Smokeless tobacco: Never Used  Substance and Sexual Activity  . Alcohol use: Not Currently    Alcohol/week: 0.0 standard drinks  . Drug use: Not Currently  . Sexual activity: Not on file  Lifestyle  . Physical activity    Days per week: Not on file    Minutes per session: Not on file  . Stress: Not on file  Relationships  .   Social Herbalist on phone: Not on file    Gets together: Not on file    Attends religious service: Not on file    Active member of club or organization: Not on file    Attends meetings of clubs or organizations: Not on file    Relationship status: Not on file  . Intimate partner violence    Fear of current or ex partner: Not on file    Emotionally abused: Not on file    Physically abused: Not on file    Forced sexual activity: Not on file  Other Topics Concern  . Not on file  Social History Narrative  . Not on file   No family history on file.  Objective: Office vital signs reviewed. BP 119/77   Pulse 73   Temp 97.9 F (36.6 C) (Oral)   Ht 5' 1" (1.549 m)   Wt 122 lb (55.3 kg)   BMI 23.05 kg/m   Physical Examination:  General: Awake, alert, well nourished, No  acute distress HEENT: EOMI, sclera white, MMM Cardio: regular rate and rhythm, S1S2 heard, no murmurs appreciated Pulm: clear to auscultation bilaterally, no wheezes, rhonchi or rales; normal work of breathing on room air Extremity: Warm, well-perfused, no edema.  Assessment/ Plan: 63 y.o. female   1. Hypertension, essential Controlled on hydrochlorothiazide.  Check BMP. - BMP8+EGFR  2. Factor V Leiden mutation ([place]) INR subtherapeutic again today at 1.8.  We have discussed increasing her dose to 15 mg every Monday, Wednesday and Friday with 10 mg daily all other days.  She will follow-up in 2 weeks, sooner if needed.  Also, her colonoscopy is scheduled for mid September.  She will come in to have INR checked a couple of days prior to the procedure to start her Lovenox bridge. - CoaguChek XS/INR Waived - warfarin (COUMADIN) 5 MG tablet; Take 54m Mondays, Wednesdays and Fridays w/ 142mdaily all other days  Dispense: 180 tablet; Refill: 1   No orders of the defined types were placed in this encounter.  No orders of the defined types were placed in this encounter.    AsJanora NorlanderDO [phone]

## 2018-12-06 LAB — BMP8+EGFR
BUN/Creatinine Ratio: 27 (ref 12–28)
BUN: 21 mg/dL (ref 8–27)
CO2: 25 mmol/L (ref 20–29)
Calcium: 9.2 mg/dL (ref 8.7–10.3)
Chloride: 97 mmol/L (ref 96–106)
Creatinine, Ser: 0.79 mg/dL (ref 0.57–1.00)
GFR calc Af Amer: 93 mL/min/{1.73_m2} (ref >59)
GFR calc non Af Amer: 81 mL/min/{1.73_m2} (ref >59)
Glucose: 106 mg/dL — ABNORMAL HIGH (ref 65–99)
Potassium: 4 mmol/L (ref 3.5–5.2)
Sodium: 136 mmol/L (ref 134–144)

## 2018-12-22 ENCOUNTER — Other Ambulatory Visit: Payer: Self-pay

## 2018-12-23 ENCOUNTER — Ambulatory Visit: Payer: BC Managed Care – PPO | Admitting: Family Medicine

## 2018-12-23 ENCOUNTER — Encounter: Payer: Self-pay | Admitting: Family Medicine

## 2018-12-23 VITALS — BP 125/80 | HR 80 | Temp 98.5°F | Ht 61.0 in | Wt 121.0 lb

## 2018-12-23 DIAGNOSIS — D6851 Activated protein C resistance: Secondary | ICD-10-CM

## 2018-12-23 DIAGNOSIS — Z7901 Long term (current) use of anticoagulants: Secondary | ICD-10-CM | POA: Diagnosis not present

## 2018-12-23 LAB — COAGUCHEK XS/INR WAIVED
INR: 2.3 — ABNORMAL HIGH (ref 0.9–1.1)
Prothrombin Time: 27.3 s

## 2018-12-23 NOTE — Progress Notes (Signed)
Subjective: CC: Factor V Leiden PCP: [name], DO HPI:[name] is a 63 y.o. female presenting to clinic today for:  1. Factor V Leiden History: Factor V Leiden.  She sustained 2 clots in her lifetime, once in 2004 and once in 2006.  She reports both of these were cerebral clots.  She is on chronic anticoagulation with warfarin.  Goal INR is 2-3.    Last INR was subtherapeutic 2 weeks ago at 1.8.  She has been taking 15 mg every Monday, Wednesday and Fridays and 10 mg all other days.  She notes that she is been doing well on this regimen.  Denies any abnormal bleeding including hematochezia, melena, hematuria, abnormal vaginal bleeding.  She does still occasionally enjoy a salad but does try to avoid dark leafy greens when able.  ROS: Per HPI  Allergies  Allergen Reactions  . Codeine   . Donnatal [Belladonna Alk-Phenobarb Er]   . Librium    Past Medical History:  Diagnosis Date  . Anemia     Current Outpatient Medications:  .  diclofenac sodium (VOLTAREN) 1 % GEL, APPLY 1 GRAM TO AFFECTED AREA TWICE DAILY AS NEEDED, Disp: 100 g, Rfl: 2 .  ESTRACE VAGINAL 0.1 MG/GM vaginal cream, Apply vaginally as directed, Disp: , Rfl:  .  fluticasone (FLONASE) 50 MCG/ACT nasal spray, Place 2 sprays into the nose daily. Reported on 05/13/2015, Disp: , Rfl:  .  hydrochlorothiazide (MICROZIDE) 12.5 MG capsule, , Disp: , Rfl:  .  ibandronate (BONIVA) 150 MG tablet, Take 150 mg by mouth every 30 (thirty) days. Take in the morning with a full glass of water, on an empty stomach, and do not take anything else by mouth or lie down for the next 30 min., Disp: , Rfl:  .  Ketoprofen 10 % CREA, Apply to area of pain 1 to 2 times per day., Disp: 60 g, Rfl: 0 .  loratadine (CLARITIN) 10 MG tablet, Take 1 tablet (10 mg total) by mouth daily., Disp: 30 tablet, Rfl: 1 .  warfarin (COUMADIN) 5 MG tablet, Take '15mg'$  Mondays, Wednesdays and Fridays w/ '10mg'$  daily all other days, Disp: 180 tablet, Rfl: 1  Social History   Socioeconomic History  . Marital status: Married    Spouse name: Not on file  . Number of children: Not on file  . Years of education: Not on file  . Highest education level: Not on file  Occupational History  . Not on file  Social Needs  . Financial resource strain: Not on file  . Food insecurity    Worry: Not on file    Inability: Not on file  . Transportation needs    Medical: Not on file    Non-medical: Not on file  Tobacco Use  . Smoking status: Never Smoker  . Smokeless tobacco: Never Used  Substance and Sexual Activity  . Alcohol use: Not Currently    Alcohol/week: 0.0 standard drinks  . Drug use: Not Currently  . Sexual activity: Not on file  Lifestyle  . Physical activity    Days per week: Not on file    Minutes per session: Not on file  . Stress: Not on file  Relationships  . Social Herbalist on phone: Not on file    Gets together: Not on file    Attends religious service: Not on file    Active member of club or organization: Not on file    Attends meetings of  clubs or organizations: Not on file    Relationship status: Not on file  . Intimate partner violence    Fear of current or ex partner: Not on file    Emotionally abused: Not on file    Physically abused: Not on file    Forced sexual activity: Not on file  Other Topics Concern  . Not on file  Social History Narrative  . Not on file   No family history on file.  Objective: Office vital signs reviewed. BP 125/80   Pulse 80   Temp 98.5 F (36.9 C) (Temporal)   Ht '5\' 1"'$  (1.549 m)   Wt 121 lb (54.9 kg)   BMI 22.86 kg/m   Physical Examination:  General: Awake, alert, well nourished, No acute distress HEENT: sclera white, MMM Cardio: regular rate and rhythm, S1S2 heard, no murmurs appreciated Pulm: clear to auscultation bilaterally, no wheezes, rhonchi or rales; normal work of breathing on room air  Assessment/ Plan: 63 y.o. female   1. Factor V Leiden  mutation ([place]) INR today is therapeutic at 2.3.  Continue current regimen.  She will follow-up with me in the next 4 to 6 weeks for repeat INR check.  We will also need to reconvene near the end of September for INR and transition over to Lovenox in anticipation for her colonoscopy. - CoaguChek XS/INR Waived  2. Warfarin anticoagulation - CoaguChek XS/INR Waived    No orders of the defined types were placed in this encounter.  No orders of the defined types were placed in this encounter.    [name], DO [place] [phone]

## 2018-12-31 ENCOUNTER — Other Ambulatory Visit: Payer: Self-pay | Admitting: Family Medicine

## 2019-01-02 ENCOUNTER — Ambulatory Visit: Payer: BC Managed Care – PPO | Admitting: Family Medicine

## 2019-01-09 ENCOUNTER — Ambulatory Visit: Payer: BC Managed Care – PPO | Admitting: Family Medicine

## 2019-01-30 ENCOUNTER — Ambulatory Visit: Payer: BC Managed Care – PPO | Admitting: Family Medicine

## 2019-02-03 ENCOUNTER — Ambulatory Visit: Payer: BC Managed Care – PPO | Admitting: Family Medicine

## 2019-02-03 DIAGNOSIS — M2669 Other specified disorders of temporomandibular joint: Secondary | ICD-10-CM | POA: Diagnosis not present

## 2019-02-06 ENCOUNTER — Other Ambulatory Visit: Payer: Self-pay

## 2019-02-09 ENCOUNTER — Other Ambulatory Visit: Payer: Self-pay

## 2019-02-09 ENCOUNTER — Encounter: Payer: Self-pay | Admitting: Family Medicine

## 2019-02-09 ENCOUNTER — Ambulatory Visit: Payer: BC Managed Care – PPO | Admitting: Family Medicine

## 2019-02-09 VITALS — BP 138/84 | HR 95 | Temp 98.0°F | Ht 61.0 in | Wt 123.0 lb

## 2019-02-09 DIAGNOSIS — Z23 Encounter for immunization: Secondary | ICD-10-CM | POA: Diagnosis not present

## 2019-02-09 DIAGNOSIS — D6851 Activated protein C resistance: Secondary | ICD-10-CM

## 2019-02-09 LAB — COAGUCHEK XS/INR WAIVED
INR: 3.2 — ABNORMAL HIGH (ref 0.9–1.1)
Prothrombin Time: 38.5 s

## 2019-02-09 NOTE — Progress Notes (Signed)
Subjective: CC: Factor V Leiden PCP: Emily Norlander, DO HPI:[name] is a 63 y.o. female presenting to clinic today for:  1. Factor V Leiden History: Factor V Leiden.  She sustained 2 clots in her lifetime, once in 2004 and once in 2006.  She reports both of these were cerebral clots.  She is on chronic anticoagulation with warfarin.  Goal INR is 2-3.    Last INR was subtherapeutic 2 weeks ago at 2.3.  She has been taking 15 mg every Monday, Wednesday and Fridays and 10 mg all other days.  Denies any vaginal bleeding, hematochezia, melena, hematuria.  ROS: Per HPI  Allergies  Allergen Reactions  . Codeine   . Donnatal [Belladonna Alk-Phenobarb Er]   . Librium    Past Medical History:  Diagnosis Date  . Anemia     Current Outpatient Medications:  .  diclofenac sodium (VOLTAREN) 1 % GEL, APPLY 1 GRAM TO AFFECTED AREA TWICE DAILY AS NEEDED, Disp: 100 g, Rfl: 2 .  ESTRACE VAGINAL 0.1 MG/GM vaginal cream, Apply vaginally as directed, Disp: , Rfl:  .  fluticasone (FLONASE) 50 MCG/ACT nasal spray, Place 2 sprays into the nose daily. Reported on 05/13/2015, Disp: , Rfl:  .  hydrochlorothiazide (MICROZIDE) 12.5 MG capsule, , Disp: , Rfl:  .  ibandronate (BONIVA) 150 MG tablet, Take 150 mg by mouth every 30 (thirty) days. Take in the morning with a full glass of water, on an empty stomach, and do not take anything else by mouth or lie down for the next 30 min., Disp: , Rfl:  .  Ketoprofen 10 % CREA, Apply to area of pain 1 to 2 times per day., Disp: 60 g, Rfl: 0 .  loratadine (CLARITIN) 10 MG tablet, Take 1 tablet (10 mg total) by mouth daily., Disp: 30 tablet, Rfl: 1 .  warfarin (COUMADIN) 5 MG tablet, Take '15mg'$  Mondays, Wednesdays and Fridays w/ '10mg'$  daily all other days, Disp: 180 tablet, Rfl: 1 Social History   Socioeconomic History  . Marital status: Married    Spouse name: Not on file  . Number of children: Not on file  . Years of education: Not on file  . Highest  education level: Not on file  Occupational History  . Not on file  Social Needs  . Financial resource strain: Not on file  . Food insecurity    Worry: Not on file    Inability: Not on file  . Transportation needs    Medical: Not on file    Non-medical: Not on file  Tobacco Use  . Smoking status: Never Smoker  . Smokeless tobacco: Never Used  Substance and Sexual Activity  . Alcohol use: Not Currently    Alcohol/week: 0.0 standard drinks  . Drug use: Not Currently  . Sexual activity: Not on file  Lifestyle  . Physical activity    Days per week: Not on file    Minutes per session: Not on file  . Stress: Not on file  Relationships  . Social Herbalist on phone: Not on file    Gets together: Not on file    Attends religious service: Not on file    Active member of club or organization: Not on file    Attends meetings of clubs or organizations: Not on file    Relationship status: Not on file  . Intimate partner violence    Fear of current or ex partner: Not on file  Emotionally abused: Not on file    Physically abused: Not on file    Forced sexual activity: Not on file  Other Topics Concern  . Not on file  Social History Narrative  . Not on file   No family history on file.  Objective: Office vital signs reviewed. BP 138/84   Pulse 95   Temp 98 F (36.7 C) (Temporal)   Ht '5\' 1"'$  (1.549 m)   Wt 123 lb (55.8 kg)   SpO2 98%   BMI 23.24 kg/m   Physical Examination:  General: Awake, alert, well nourished, No acute distress HEENT: sclera white, MMM Cardio: regular rate Pulm:  Normal work of breathing on room air  Assessment/ Plan: 63 y.o. female   1. Factor V Leiden mutation ([place]) INR slightly supratherapeutic at 3.2 today.  Because she has been stable on current dose will continue current regimen.  I advised her to go ahead and eat a vitamin K rich meal.  She is having no red flag bleeding.  She will follow-up in 1 month for repeat INR prior to her  colonoscopy.  I will be out of town so we will try and arrange this with Alyse Low and I will sign out to her my instructions. - CoaguChek XS/INR Waived  Patient will be stopping warfarin 6 days prior to her colonoscopy, which is currently scheduled on 03/18/2019.  Her last Coumadin dose will be on 03/10/2019.  She will come in for INR check on 03/13/2019, at which time, she should be able to start her Lovenox bridge which has already been sent in previously.  She will continue twice daily Lovenox at 1 mg/kg until the night prior to the colonoscopy, which she will skip.  Once her colonoscopy has been completed and she is determined to be hemostatic and clinically stable per GI, she can resume both warfarin and Lovenox for 4 to 6 days or until her INR is therapeutic.  Plan to have her come in for an INR check 4 days following colonoscopy.  These directions were reviewed with the patient.  She has a copy as well.   Orders Placed This Encounter  Procedures  . CoaguChek XS/INR Waived   No orders of the defined types were placed in this encounter.    Emily Norlander, DO [place] [phone]

## 2019-02-16 ENCOUNTER — Other Ambulatory Visit: Payer: Self-pay | Admitting: Family Medicine

## 2019-02-24 DIAGNOSIS — M2041 Other hammer toe(s) (acquired), right foot: Secondary | ICD-10-CM | POA: Diagnosis not present

## 2019-02-24 DIAGNOSIS — M79671 Pain in right foot: Secondary | ICD-10-CM | POA: Diagnosis not present

## 2019-03-12 ENCOUNTER — Other Ambulatory Visit: Payer: Self-pay

## 2019-03-13 ENCOUNTER — Encounter: Payer: Self-pay | Admitting: Family

## 2019-03-13 ENCOUNTER — Ambulatory Visit: Payer: BC Managed Care – PPO | Admitting: Family

## 2019-03-13 VITALS — BP 141/79 | HR 66 | Temp 97.7°F | Ht 61.0 in | Wt 123.4 lb

## 2019-03-13 DIAGNOSIS — Z1211 Encounter for screening for malignant neoplasm of colon: Secondary | ICD-10-CM

## 2019-03-13 DIAGNOSIS — Z1159 Encounter for screening for other viral diseases: Secondary | ICD-10-CM | POA: Diagnosis not present

## 2019-03-13 DIAGNOSIS — D6851 Activated protein C resistance: Secondary | ICD-10-CM | POA: Diagnosis not present

## 2019-03-13 LAB — COAGUCHEK XS/INR WAIVED
INR: 1.2 — ABNORMAL HIGH (ref 0.9–1.1)
Prothrombin Time: 14.4 s

## 2019-03-13 MED ORDER — ENOXAPARIN SODIUM 60 MG/0.6ML ~~LOC~~ SOLN: 1.0000 mg/kg | Freq: Two times a day (BID) | SUBCUTANEOUS | 0 refills | Status: DC

## 2019-03-13 NOTE — Progress Notes (Signed)
Subjective:    Patient ID: [name], female    DOB: [birth year], 63 y.o.   MRN: [id number]  Chief Complaint  Patient presents with  . protime recheck    HPI PT presents to the office today for INR check. She is scheduled for colonoscopy 03/18/19. She is currently taking warfarin for Fact V Ledien. Her last warfarin was 03/10/19. Her INR today is 1.2.   Her PCP already sent Lovenox 1mg /kg to her pharmacy that she will begin twice a day today. She will hold the evening Lovenox prior to to her colonoscopy.She will resume both warfarin and Lovenox for 4 to 6 days or until her INR is therapeutic.  She is scheduled for  INR check 4 days following colonoscopy with PCP.    Review of Systems  All other systems reviewed and are negative.      Objective:   Physical Exam Vitals signs reviewed.  Constitutional:      General: She is not in acute distress.    Appearance: She is well-developed.  HENT:     Head: Normocephalic and atraumatic.  Eyes:     Pupils: Pupils are equal, round, and reactive to light.  Neck:     Musculoskeletal: Normal range of motion and neck supple.     Thyroid: No thyromegaly.  Cardiovascular:     Rate and Rhythm: Normal rate and regular rhythm.     Heart sounds: Normal heart sounds. No murmur.  Pulmonary:     Effort: Pulmonary effort is normal. No respiratory distress.     Breath sounds: Normal breath sounds. No wheezing.  Abdominal:     General: Bowel sounds are normal. There is no distension.     Palpations: Abdomen is soft.     Tenderness: There is no abdominal tenderness.  Musculoskeletal: Normal range of motion.        General: No tenderness.  Skin:    General: Skin is warm and dry.  Neurological:     Mental Status: She is alert and oriented to person, place, and time.     Cranial Nerves: No cranial nerve deficit.     Deep Tendon Reflexes: Reflexes are normal and symmetric.  Psychiatric:        Behavior: Behavior normal.        Thought  Content: Thought content normal.        Judgment: Judgment normal.     BP (!) 141/79   Pulse 66   Temp 97.7 F (36.5 C) (Temporal)   Ht 5\' 1"  (1.549 m)   Wt 123 lb 6.4 oz (56 kg)   SpO2 100%   BMI 23.32 kg/m      Assessment & Plan:  [name] comes in today with chief complaint of protime recheck   Diagnosis and orders addressed:  1. Factor V Leiden mutation ([facility]) 15 mins spent discussing the correct way to take Lovenox She will start today and taken BID and will plan to hold evening dose  Prior to colonoscopy.  She will keep follow up appt with PCP - CoaguChek XS/INR Waived - enoxaparin (LOVENOX) 60 MG/0.6ML injection; Inject 0.55 mLs (55 mg total) into the skin every 12 (twelve) hours. As directed.  Hold pm dose evening before colonoscopy.  Dispense: 12 mL; Refill: 0  2. Colon cancer screening - enoxaparin (LOVENOX) 60 MG/0.6ML injection; Inject 0.55 mLs (55 mg total) into the skin every 12 (twelve) hours. As directed.  Hold pm dose evening before colonoscopy.  Dispense: 12  mL; Refill: 0   Evelina Dun, FNP

## 2019-03-14 ENCOUNTER — Other Ambulatory Visit: Payer: Self-pay | Admitting: Family

## 2019-03-14 DIAGNOSIS — Z1211 Encounter for screening for malignant neoplasm of colon: Secondary | ICD-10-CM

## 2019-03-14 DIAGNOSIS — D6851 Activated protein C resistance: Secondary | ICD-10-CM

## 2019-03-17 DIAGNOSIS — G4489 Other headache syndrome: Secondary | ICD-10-CM | POA: Diagnosis not present

## 2019-03-17 DIAGNOSIS — R5381 Other malaise: Secondary | ICD-10-CM | POA: Diagnosis not present

## 2019-03-17 DIAGNOSIS — I10 Essential (primary) hypertension: Secondary | ICD-10-CM | POA: Diagnosis not present

## 2019-03-18 ENCOUNTER — Emergency Department (HOSPITAL_COMMUNITY)
Admission: EM | Admit: 2019-03-18 | Discharge: 2019-03-18 | Disposition: A | Discharge status: Home / Self Care | Payer: BC Managed Care – PPO | Attending: Emergency Medicine | Admitting: Emergency Medicine

## 2019-03-18 ENCOUNTER — Encounter (HOSPITAL_COMMUNITY): Payer: Self-pay | Admitting: Emergency Medicine

## 2019-03-18 ENCOUNTER — Other Ambulatory Visit: Payer: Self-pay

## 2019-03-18 DIAGNOSIS — Z79899 Other long term (current) drug therapy: Secondary | ICD-10-CM | POA: Insufficient documentation

## 2019-03-18 DIAGNOSIS — E876 Hypokalemia: Secondary | ICD-10-CM | POA: Insufficient documentation

## 2019-03-18 DIAGNOSIS — Z7901 Long term (current) use of anticoagulants: Secondary | ICD-10-CM | POA: Insufficient documentation

## 2019-03-18 DIAGNOSIS — E871 Hypo-osmolality and hyponatremia: Secondary | ICD-10-CM | POA: Diagnosis not present

## 2019-03-18 DIAGNOSIS — Z1211 Encounter for screening for malignant neoplasm of colon: Secondary | ICD-10-CM | POA: Diagnosis not present

## 2019-03-18 DIAGNOSIS — R531 Weakness: Secondary | ICD-10-CM | POA: Diagnosis not present

## 2019-03-18 DIAGNOSIS — I10 Essential (primary) hypertension: Secondary | ICD-10-CM | POA: Insufficient documentation

## 2019-03-18 DIAGNOSIS — E860 Dehydration: Secondary | ICD-10-CM

## 2019-03-18 LAB — URINALYSIS, ROUTINE W REFLEX MICROSCOPIC
Bacteria, UA: NONE SEEN
Bilirubin Urine: NEGATIVE
Glucose, UA: >=500 mg/dL — AB
Hgb urine dipstick: NEGATIVE
Ketones, ur: 20 mg/dL — AB
Leukocytes,Ua: NEGATIVE
Nitrite: NEGATIVE
Protein, ur: NEGATIVE mg/dL
Specific Gravity, Urine: 1.01 (ref 1.005–1.030)
pH: 7 (ref 5.0–8.0)

## 2019-03-18 LAB — BASIC METABOLIC PANEL
Anion gap: 8 (ref 5–15)
BUN: 6 mg/dL — ABNORMAL LOW (ref 8–23)
CO2: 21 mmol/L — ABNORMAL LOW (ref 22–32)
Calcium: 7.9 mg/dL — ABNORMAL LOW (ref 8.9–10.3)
Chloride: 98 mmol/L (ref 98–111)
Creatinine, Ser: 0.43 mg/dL — ABNORMAL LOW (ref 0.44–1.00)
GFR calc Af Amer: >60 mL/min (ref >60)
GFR calc non Af Amer: >60 mL/min (ref >60)
Glucose, Bld: 138 mg/dL — ABNORMAL HIGH (ref 70–99)
Potassium: 3.7 mmol/L (ref 3.5–5.1)
Sodium: 127 mmol/L — ABNORMAL LOW (ref 135–145)

## 2019-03-18 LAB — CBC WITH DIFFERENTIAL/PLATELET
Abs Immature Granulocytes: 0.03 10*3/uL (ref 0.00–0.07)
Basophils Absolute: 0 10*3/uL (ref 0.0–0.1)
Basophils Relative: 0 %
Eosinophils Absolute: 0 10*3/uL (ref 0.0–0.5)
Eosinophils Relative: 0 %
HCT: 40.6 % (ref 36.0–46.0)
Hemoglobin: 14.3 g/dL (ref 12.0–15.0)
Immature Granulocytes: 0 %
Lymphocytes Relative: 14 %
Lymphs Abs: 1.3 10*3/uL (ref 0.7–4.0)
MCH: 33.1 pg (ref 26.0–34.0)
MCHC: 35.2 g/dL (ref 30.0–36.0)
MCV: 94 fL (ref 80.0–100.0)
Monocytes Absolute: 0.4 10*3/uL (ref 0.1–1.0)
Monocytes Relative: 5 %
Neutro Abs: 7.5 10*3/uL (ref 1.7–7.7)
Neutrophils Relative %: 81 %
Platelets: 237 10*3/uL (ref 150–400)
RBC: 4.32 MIL/uL (ref 3.87–5.11)
RDW: 11 % — ABNORMAL LOW (ref 11.5–15.5)
WBC: 9.3 10*3/uL (ref 4.0–10.5)
nRBC: 0 % (ref 0.0–0.2)

## 2019-03-18 LAB — COMPREHENSIVE METABOLIC PANEL
ALT: 60 U/L — ABNORMAL HIGH (ref 0–44)
AST: 73 U/L — ABNORMAL HIGH (ref 15–41)
Albumin: 4.5 g/dL (ref 3.5–5.0)
Alkaline Phosphatase: 45 U/L (ref 38–126)
Anion gap: 16 — ABNORMAL HIGH (ref 5–15)
BUN: 9 mg/dL (ref 8–23)
CO2: 20 mmol/L — ABNORMAL LOW (ref 22–32)
Calcium: 8.3 mg/dL — ABNORMAL LOW (ref 8.9–10.3)
Chloride: 86 mmol/L — ABNORMAL LOW (ref 98–111)
Creatinine, Ser: 0.56 mg/dL (ref 0.44–1.00)
GFR calc Af Amer: >60 mL/min (ref >60)
GFR calc non Af Amer: >60 mL/min (ref >60)
Glucose, Bld: 196 mg/dL — ABNORMAL HIGH (ref 70–99)
Potassium: 2.8 mmol/L — ABNORMAL LOW (ref 3.5–5.1)
Sodium: 122 mmol/L — ABNORMAL LOW (ref 135–145)
Total Bilirubin: 1.4 mg/dL — ABNORMAL HIGH (ref 0.3–1.2)
Total Protein: 7.3 g/dL (ref 6.5–8.1)

## 2019-03-18 MED ORDER — LORAZEPAM 2 MG/ML IJ SOLN: 1.0000 mg | Freq: Once | INTRAMUSCULAR | Status: DC

## 2019-03-18 MED ORDER — LORAZEPAM 2 MG/ML IJ SOLN
0.5000 mg | Freq: Once | INTRAMUSCULAR | Status: AC
  Administered 2019-03-18: 0.5 mg via INTRAVENOUS
  Filled 2019-03-18: qty 1

## 2019-03-18 MED ORDER — ONDANSETRON HCL 4 MG/2ML IJ SOLN
4.0000 mg | Freq: Once | INTRAMUSCULAR | Status: AC
  Administered 2019-03-18: 4 mg via INTRAVENOUS
  Filled 2019-03-18: qty 2

## 2019-03-18 MED ORDER — SODIUM CHLORIDE 0.9 % IV BOLUS
1000.0000 mL | Freq: Once | INTRAVENOUS | Status: AC
  Administered 2019-03-18: 1000 mL via INTRAVENOUS

## 2019-03-18 MED ORDER — POTASSIUM CHLORIDE CRYS ER 20 MEQ PO TBCR: 20.0000 meq | EXTENDED_RELEASE_TABLET | Freq: Two times a day (BID) | ORAL | 0 refills | Status: DC

## 2019-03-18 MED ORDER — SODIUM CHLORIDE 0.9 % IV SOLN
Freq: Once | INTRAVENOUS | Status: AC
  Administered 2019-03-18: 02:00:00 via INTRAVENOUS

## 2019-03-18 MED ORDER — POTASSIUM CHLORIDE 10 MEQ/100ML IV SOLN
10.0000 meq | INTRAVENOUS | Status: AC
  Administered 2019-03-18 (×2): 10 meq via INTRAVENOUS
  Filled 2019-03-18 (×2): qty 100

## 2019-03-18 NOTE — ED Notes (Signed)
Pt w/c to BR due to nauseous and dizziness upon standing. Stayed in BR w/ pt due to increase shaking as well

## 2019-03-18 NOTE — Discharge Instructions (Addendum)
You were seen today for dehydration.  This is likely related to your colonoscopy prep.  You were given fluids and potassium.  Your repeat lab work is reassuring.  Proceed to have your colonoscopy this morning.  I have discussed your case with the on-call GI doctor.  You will need to have recheck of your potassium and sodium at the end of the week.  Please call your primary physician for an appointment and recheck.  After colonoscopy, make sure to drink electrolyte rich fluids.

## 2019-03-18 NOTE — ED Provider Notes (Signed)
[facility] EMERGENCY DEPARTMENT Provider Note   CSN: [id number] Arrival date & time: 03/18/19  0025     History   Chief Complaint Chief Complaint  Patient presents with  . Numbness    HPI [name] is a 63 y.o. female.     HPI  This is a 63 year old female with a history of hypertension, factor V Leiden, CVA who presents with generalized weakness and "shakiness."  Patient started a prep for her colonoscopy yesterday.  Her last normal solid food intake was on Monday night at 7 PM.  She has been drinking her colonoscopy prep and has had multiple loose watery stools.  She states that around 6 PM last night she developed overall body shakiness and tremulousness.  She states she feels generally weak and dizzy.  She is also reporting some numbness left greater than right.  No weakness or focal abnormality.  No speech difficulties.  He denies any recent fevers.  She had Covid testing that was negative in preparation for her colonoscopy.  Past Medical History:  Diagnosis Date  . Anemia     Patient Active Problem List   Diagnosis Date Noted  . Hypertension, essential 11/05/2018  . Warfarin anticoagulation 08/12/2018  . H/O: CVA (cerebrovascular accident) 03/06/2018  . Perennial allergic rhinitis with seasonal variation 12/29/2015  . Factor V Leiden mutation ([place]) 08/28/2012    History reviewed. No pertinent surgical history.   OB History   No obstetric history on file.      Home Medications    Prior to Admission medications   Medication Sig Start Date End Date Taking? Authorizing Provider  diclofenac sodium (VOLTAREN) 1 % GEL APPLY 1 GRAM TO AFFECTED AREA TWICE DAILY AS NEEDED 12/31/18   Ronnie Doss M, DO  enoxaparin (LOVENOX) 60 MG/0.6ML injection Inject 0.55 mLs (55 mg total) into the skin every 12 (twelve) hours. As directed.  Hold pm dose evening before colonoscopy. 03/13/19   Sharion Balloon, FNP  ESTRACE VAGINAL 0.1 MG/GM vaginal cream Apply vaginally as  directed 07/25/12   [provider]  fluticasone (FLONASE) 50 MCG/ACT nasal spray Place 2 sprays into the nose daily. Reported on 05/13/2015 06/28/12   [provider]  hydrochlorothiazide (MICROZIDE) 12.5 MG capsule TAKE ONE (1) CAPSULE EACH DAY 02/17/19   Gottschalk, Ashly M, DO  ibandronate (BONIVA) 150 MG tablet Take 150 mg by mouth every 30 (thirty) days. Take in the morning with a full glass of water, on an empty stomach, and do not take anything else by mouth or lie down for the next 30 min.    [provider]  Ketoprofen 10 % CREA Apply to area of pain 1 to 2 times per day. 12/17/16   Timmothy Euler, MD  lisinopril (ZESTRIL) 5 MG tablet Take 5 mg by mouth daily. 02/20/19   [provider]  loratadine (CLARITIN) 10 MG tablet Take 1 tablet (10 mg total) by mouth daily. 10/28/12   Hassell Done, Mary-Margaret, FNP  potassium chloride SA (KLOR-CON) 20 MEQ tablet Take 1 tablet (20 mEq total) by mouth 2 (two) times daily. 03/18/19   Merryl Hacker, MD  warfarin (COUMADIN) 5 MG tablet Take 52m Mondays, Wednesdays and Fridays w/ 124mdaily all other days 12/05/18   GoJanora NorlanderDO    Family History History reviewed. No pertinent family history.  Social History Social History   Tobacco Use  . Smoking status: Never Smoker  . Smokeless tobacco: Never Used  Substance Use Topics  .  Alcohol use: Not Currently    Alcohol/week: 0.0 standard drinks  . Drug use: Not Currently     Allergies   Codeine, Donnatal [belladonna alk-phenobarb er], and Librium   Review of Systems Review of Systems  Constitutional: Negative for fever.  Respiratory: Negative for shortness of breath.   Cardiovascular: Negative for chest pain.  Gastrointestinal: Positive for diarrhea and nausea. Negative for abdominal pain and vomiting.  Genitourinary: Negative for dysuria.  Neurological: Positive for dizziness, tremors and weakness. Negative for numbness and headaches.  All other  systems reviewed and are negative.    Physical Exam Updated Vital Signs BP (!) 140/106 (BP Location: Right Arm)   Pulse 75   Temp 97.7 F (36.5 C) (Oral)   Resp (!) 22   Ht 1.575 m (5' 2")   Wt 54.4 kg   SpO2 100%   BMI 21.95 kg/m   Physical Exam Vitals signs and nursing note reviewed.  Constitutional:      General: She is not in acute distress.    Appearance: She is well-developed.  HENT:     Head: Normocephalic and atraumatic.     Mouth/Throat:     Mouth: Mucous membranes are dry.  Eyes:     Pupils: Pupils are equal, round, and reactive to light.  Neck:     Musculoskeletal: Neck supple.  Cardiovascular:     Rate and Rhythm: Normal rate and regular rhythm.     Heart sounds: Normal heart sounds.  Pulmonary:     Effort: Pulmonary effort is normal. No respiratory distress.     Breath sounds: No wheezing.  Abdominal:     General: Bowel sounds are normal.     Palpations: Abdomen is soft.     Tenderness: There is no abdominal tenderness.  Musculoskeletal:     Right lower leg: No edema.     Left lower leg: No edema.  Skin:    General: Skin is warm and dry.  Neurological:     Mental Status: She is alert and oriented to person, place, and time.     Comments: Cranial nerves II through XII intact, 5 out of 5 strength in all 4 extremities, no dysmetria to finger-nose-finger  Psychiatric:        Mood and Affect: Mood normal.      ED Treatments / Results  Labs (all labs ordered are listed, but only abnormal results are displayed) Labs Reviewed  CBC WITH DIFFERENTIAL/PLATELET - Abnormal; Notable for the following components:      Result Value   RDW 11.0 (*)    All other components within normal limits  COMPREHENSIVE METABOLIC PANEL - Abnormal; Notable for the following components:   Sodium 122 (*)    Potassium 2.8 (*)    Chloride 86 (*)    CO2 20 (*)    Glucose, Bld 196 (*)    Calcium 8.3 (*)    AST 73 (*)    ALT 60 (*)    Total Bilirubin 1.4 (*)    Anion gap  16 (*)    All other components within normal limits  BASIC METABOLIC PANEL - Abnormal; Notable for the following components:   Sodium 127 (*)    CO2 21 (*)    Glucose, Bld 138 (*)    BUN 6 (*)    Creatinine, Ser 0.43 (*)    Calcium 7.9 (*)    All other components within normal limits  URINALYSIS, ROUTINE W REFLEX MICROSCOPIC - Abnormal; Notable for the following components:     Glucose, UA >=500 (*)    Ketones, ur 20 (*)    All other components within normal limits    EKG EKG Interpretation  Date/Time:  Wednesday March 18 2019 00:42:31 EDT Ventricular Rate:  71 PR Interval:    QRS Duration: 78 QT Interval:  440 QTC Calculation: 479 R Axis:   -18 Text Interpretation: Sinus rhythm Probable left atrial enlargement Probable left ventricular hypertrophy Anterior Q waves, possibly due to LVH Confirmed by Horton, Courtney ([id number]) on 03/18/2019 1:50:10 AM   Radiology No results found.  Procedures Procedures (including critical care time)  CRITICAL CARE Performed by: Courtney F Horton   Total critical care time: 40 minutes  Critical care time was exclusive of separately billable procedures and treating other patients.  Critical care was necessary to treat or prevent imminent or life-threatening deterioration.  Critical care was time spent personally by me on the following activities: development of treatment plan with patient and/or surrogate as well as nursing, discussions with consultants, evaluation of patient's response to treatment, examination of patient, obtaining history from patient or surrogate, ordering and performing treatments and interventions, ordering and review of laboratory studies, ordering and review of radiographic studies, pulse oximetry and re-evaluation of patient's condition.   Medications Ordered in ED Medications  sodium chloride 0.9 % bolus 1,000 mL (0 mLs Intravenous Stopped 03/18/19 0255)  potassium chloride 10 mEq in 100 mL IVPB (0 mEq  Intravenous Stopped 03/18/19 0429)  0.9 %  sodium chloride infusion ( Intravenous New Bag/Given 03/18/19 0215)  ondansetron (ZOFRAN) injection 4 mg (4 mg Intravenous Given 03/18/19 0202)  LORazepam (ATIVAN) injection 0.5 mg (0.5 mg Intravenous Given 03/18/19 0202)     Initial Impression / Assessment and Plan / ED Course  I have reviewed the triage vital signs and the nursing notes.  Pertinent labs & imaging results that were available during my care of the patient were reviewed by me and considered in my medical decision making (see chart for details).  Clinical Course as of Mar 17 613  Wed Mar 18, 2019  0212 I discussed with the patient her significant hyponatremia, hypokalemia and metabolic derangement.  She looks severely dehydrated.  However, she would like to have her colonoscopy if at all possible later this morning.  I have ordered 2 runs of potassium and 125/h of normal saline.  I discussed with her that I could slowly rehydrate her but did not want to correct her sodium too quickly.  We will recheck labs in approximately 3 to 4 hours to see if any improvement.  At that time if she is feeling better and labs are looking better, we will discuss whether it is safe for her to be discharged to her colonoscopy.  I did prepare her that she may need to be admitted given that it takes time to correct significant metabolic abnormalities.  Patient stated understanding.  Her primary GI doctor is at [facility].   [CH]  0612 Patient lab work rechecked at 05 30.  Sodium now 127.  Potassium corrected to 3.7.  Her anion gap is closed with improvement of her hypochloremia.  On recheck, she states she feels much better.  She has been ambulatory to the bathroom without dizziness or lightheadedness.  I spoke to [facility], Dr. Schooler who is on-call.  We discussed the case.  I discussed with him the patient's wishes to proceed with colonoscopy if at all possible.  We have reviewed the lab work.  He  feels it is reasonable   for her to proceed to have her colonoscopy this morning; however, he cannot guarantee that Dr. Penelope Coop or anesthesiology may have some reservations given abnormal lab work this morning.  I discussed this with the patient who is understanding and will proceed for her colonoscopy.  She was also advised that she needs to have her sodium and potassium rechecked by the end of the week.  Following her colonoscopy, she should drink electrolyte rich fluids.  I will also give her a short course of potassium.   [CH]    Clinical Course User Index [CH] Airika Alkhatib, Barbette Hair, MD       Patient presents with generalized tremulousness, weakness, dizziness.  She is overall dry appearing but nontoxic and vital signs are largely reassuring.  She has no focal abnormalities on exam.  Suspect she may have dehydration or metabolic derangement from her colonoscopy prep.  Labs were obtained.  Patient was given 1 L of fluids.  She was also given Zofran for nausea.  Work-up notable for sodium of 122, potassium of 2.8.  She has an anion gap of 16.  All diarrhea and volume loss usually does not result in an anion gap, patient's chloride is 106.  This is likely the culprit.  She does have a slightly elevated glucose of 196.  Prior glucoses have been in the 100-110 range.  Do not feel she is in DKA.  See clinical course above.  Patient strongly prefers being able to get her colonoscopy and not having to reprep for future colonoscopy.  I discussed with her that her metabolic derangements were fairly significant.  However, do not feel it is unreasonable to gently rehydrate her and provide her with potassium supplementation with recheck of labs in the morning.  I discussed with her that I cannot promise that she would be able to proceed normally with her colonoscopy.  Please see clinical course above.  6:21 AM Patient discharged.  She will proceed to her colonoscopy.  Again I advised her that she needs close recheck of  her metabolic panel.  She stated understanding.  She is ambulatory independently at discharge and is otherwise asymptomatic.  Final Clinical Impressions(s) / ED Diagnoses   Final diagnoses:  Dehydration  Hyponatremia  Hypokalemia    ED Discharge Orders         Ordered    potassium chloride SA (KLOR-CON) 20 MEQ tablet  2 times daily     03/18/19 0610           Orlean Holtrop, Barbette Hair, MD 03/18/19 [phone]

## 2019-03-18 NOTE — ED Triage Notes (Signed)
Pt is taking prep for colonoscopy tonight and developed numbness to the left side and shakiness with weakness. Pt was last normal around 1800.

## 2019-03-18 NOTE — ED Notes (Signed)
Patient dressed and changed. IV removed and patient ambulated to the bathroom and to the room with no problems or complaints of dizziness.

## 2019-03-23 ENCOUNTER — Other Ambulatory Visit: Payer: Self-pay

## 2019-03-24 ENCOUNTER — Ambulatory Visit: Payer: BC Managed Care – PPO | Admitting: Family Medicine

## 2019-03-24 VITALS — BP 139/85 | HR 64 | Temp 97.8°F | Ht 62.0 in | Wt 122.0 lb

## 2019-03-24 DIAGNOSIS — R791 Abnormal coagulation profile: Secondary | ICD-10-CM

## 2019-03-24 DIAGNOSIS — Z7901 Long term (current) use of anticoagulants: Secondary | ICD-10-CM

## 2019-03-24 DIAGNOSIS — D6851 Activated protein C resistance: Secondary | ICD-10-CM

## 2019-03-24 LAB — COAGUCHEK XS/INR WAIVED
INR: 1.6 — ABNORMAL HIGH (ref 0.9–1.1)
Prothrombin Time: 19.2 s

## 2019-03-24 LAB — POCT INR: INR: 1.6 — AB (ref 2–3)

## 2019-03-24 NOTE — Patient Instructions (Signed)
We discussed the risk of not using the Lovenox while getting your Coumadin therapeutic.  You accepted these risks.  Instead we will try and compensate for your low INR with increasing the dose by 5mg .  Please try and return in about a week for recheck if possible.

## 2019-03-24 NOTE — Progress Notes (Signed)
Subjective: CC: Factor V Leiden PCP: [name], DO HPI:[name] is a 63 y.o. female presenting to clinic today for:  1. Factor V Leiden History: Factor V Leiden.  She sustained 2 clots in her lifetime, once in 2004 and once in 2006.  She reports both of these were cerebral clots.  She is on chronic anticoagulation with warfarin.  Goal INR is 2-3.    Last INR was appropriately subtherapeutic 2 weeks ago at 1.2 prior to her colonoscopy, which was normal.  She has been taking 15 mg every Monday, Wednesday and Fridays and 10 mg all other days.  She is using the Lovenox BID as prescribed but notes that she does not feel that she can continue it because it makes her sick.  No any vaginal bleeding, hematochezia, melena, hematuria.  ROS: Per HPI  Allergies  Allergen Reactions  . Codeine   . Donnatal [Belladonna Alk-Phenobarb Er]   . Librium    Past Medical History:  Diagnosis Date  . Anemia     Current Outpatient Medications:  .  diclofenac sodium (VOLTAREN) 1 % GEL, APPLY 1 GRAM TO AFFECTED AREA TWICE DAILY AS NEEDED, Disp: 100 g, Rfl: 2 .  enoxaparin (LOVENOX) 60 MG/0.6ML injection, Inject 0.55 mLs (55 mg total) into the skin every 12 (twelve) hours. As directed.  Hold pm dose evening before colonoscopy., Disp: 12 mL, Rfl: 0 .  ESTRACE VAGINAL 0.1 MG/GM vaginal cream, Apply vaginally as directed, Disp: , Rfl:  .  fluticasone (FLONASE) 50 MCG/ACT nasal spray, Place 2 sprays into the nose daily. Reported on 05/13/2015, Disp: , Rfl:  .  hydrochlorothiazide (MICROZIDE) 12.5 MG capsule, TAKE ONE (1) CAPSULE EACH DAY, Disp: 90 capsule, Rfl: 0 .  ibandronate (BONIVA) 150 MG tablet, Take 150 mg by mouth every 30 (thirty) days. Take in the morning with a full glass of water, on an empty stomach, and do not take anything else by mouth or lie down for the next 30 min., Disp: , Rfl:  .  Ketoprofen 10 % CREA, Apply to area of pain 1 to 2 times per day., Disp: 60 g, Rfl: 0 .  lisinopril  (ZESTRIL) 5 MG tablet, Take 5 mg by mouth daily., Disp: , Rfl:  .  loratadine (CLARITIN) 10 MG tablet, Take 1 tablet (10 mg total) by mouth daily., Disp: 30 tablet, Rfl: 1 .  potassium chloride SA (KLOR-CON) 20 MEQ tablet, Take 1 tablet (20 mEq total) by mouth 2 (two) times daily., Disp: 8 tablet, Rfl: 0 .  warfarin (COUMADIN) 5 MG tablet, Take '15mg'$  Mondays, Wednesdays and Fridays w/ '10mg'$  daily all other days, Disp: 180 tablet, Rfl: 1 Social History   Socioeconomic History  . Marital status: Married    Spouse name: Not on file  . Number of children: Not on file  . Years of education: Not on file  . Highest education level: Not on file  Occupational History  . Not on file  Social Needs  . Financial resource strain: Not on file  . Food insecurity    Worry: Not on file    Inability: Not on file  . Transportation needs    Medical: Not on file    Non-medical: Not on file  Tobacco Use  . Smoking status: Never Smoker  . Smokeless tobacco: Never Used  Substance and Sexual Activity  . Alcohol use: Not Currently    Alcohol/week: 0.0 standard drinks  . Drug use: Not Currently  . Sexual activity:  Not on file  Lifestyle  . Physical activity    Days per week: Not on file    Minutes per session: Not on file  . Stress: Not on file  Relationships  . Social Herbalist on phone: Not on file    Gets together: Not on file    Attends religious service: Not on file    Active member of club or organization: Not on file    Attends meetings of clubs or organizations: Not on file    Relationship status: Not on file  . Intimate partner violence    Fear of current or ex partner: Not on file    Emotionally abused: Not on file    Physically abused: Not on file    Forced sexual activity: Not on file  Other Topics Concern  . Not on file  Social History Narrative  . Not on file   No family history on file.  Objective: Office vital signs reviewed. BP 139/85   Pulse 64   Temp 97.8 F  (36.6 C) (Temporal)   Ht '5\' 2"'$  (1.575 m)   Wt 122 lb (55.3 kg)   SpO2 98%   BMI 22.31 kg/m   Physical Examination:  General: Awake, alert, well nourished, No acute distress HEENT: sclera white, MMM Cardio: RRR, S1S2 heard. No murmurs Pulm:  CTAB, no wheezes. Normal work of breathing on room air  Assessment/ Plan: 63 y.o. female   1. Subtherapeutic international normalized ratio (INR) INR is subtherapeutic at 1.6 today, despite use of the normal home dose of Coumadin.  She does not wish to continue the Lovenox and therefore I will try and compensate subtherapeutic level with increased dose of warfarin.  We discussed the risk of clotting and she accepted these risks.  Increase by 5 mg on Saturdays and recheck in 1 week.  Additionally, she does not feel that she can return in 1 week and we discussed the risk of inadequate interval monitoring.  Again she accepted these risks we will try and follow-up in at least to 2 weeks. - POCT INR  2. Factor V Leiden mutation ([place]) - CoaguChek XS/INR Waived - POCT INR  3. Warfarin anticoagulation - CoaguChek XS/INR Waived - POCT INR   Orders Placed This Encounter  Procedures  . CoaguChek XS/INR Waived   No orders of the defined types were placed in this encounter.    [name], DO [place] [phone]

## 2019-04-06 ENCOUNTER — Other Ambulatory Visit: Payer: Self-pay

## 2019-04-07 ENCOUNTER — Ambulatory Visit: Payer: BC Managed Care – PPO | Admitting: Family Medicine

## 2019-04-07 ENCOUNTER — Encounter: Payer: Self-pay | Admitting: Family Medicine

## 2019-04-07 ENCOUNTER — Telehealth: Payer: Self-pay | Admitting: Family Medicine

## 2019-04-07 VITALS — BP 135/85 | HR 90 | Temp 97.3°F | Ht 62.0 in | Wt 124.0 lb

## 2019-04-07 DIAGNOSIS — R791 Abnormal coagulation profile: Secondary | ICD-10-CM

## 2019-04-07 DIAGNOSIS — D6851 Activated protein C resistance: Secondary | ICD-10-CM | POA: Diagnosis not present

## 2019-04-07 LAB — COAGUCHEK XS/INR WAIVED
INR: 3.5 — ABNORMAL HIGH (ref 0.9–1.1)
Prothrombin Time: 42.2 s

## 2019-04-07 NOTE — Progress Notes (Signed)
Subjective: CC: Factor V Leiden PCP: [name], DO HPI:[name] is a 63 y.o. female presenting to clinic today for:  1. Factor V Leiden History: Factor V Leiden.  She sustained 2 clots in her lifetime, once in 2004 and once in 2006.  She reports both of these were cerebral clots.  She is on chronic anticoagulation with warfarin.  Goal INR is 2-3.    Last INR was subtherapeutic.  Patient unfortunately is not tolerating the Lovenox injections and wanted to compensate with increased Coumadin dosing.  She was taking 15 mg 4 days/week with 10 mg all other days per week.  Previous dosing was 15 mg 3 days/week with 10 mg daily all other days per week.  Overall she has been feeling okay.  Denies any lower extremity edema, change in exercise tolerance.  No abnormal bleeding.  ROS: Per HPI  Allergies  Allergen Reactions  . Codeine   . Donnatal [Belladonna Alk-Phenobarb Er]   . Librium    Past Medical History:  Diagnosis Date  . Anemia     Current Outpatient Medications:  .  diclofenac sodium (VOLTAREN) 1 % GEL, APPLY 1 GRAM TO AFFECTED AREA TWICE DAILY AS NEEDED, Disp: 100 g, Rfl: 2 .  enoxaparin (LOVENOX) 60 MG/0.6ML injection, Inject 0.55 mLs (55 mg total) into the skin every 12 (twelve) hours. As directed.  Hold pm dose evening before colonoscopy., Disp: 12 mL, Rfl: 0 .  ESTRACE VAGINAL 0.1 MG/GM vaginal cream, Apply vaginally as directed, Disp: , Rfl:  .  fluticasone (FLONASE) 50 MCG/ACT nasal spray, Place 2 sprays into the nose daily. Reported on 05/13/2015, Disp: , Rfl:  .  hydrochlorothiazide (MICROZIDE) 12.5 MG capsule, TAKE ONE (1) CAPSULE EACH DAY, Disp: 90 capsule, Rfl: 0 .  ibandronate (BONIVA) 150 MG tablet, Take 150 mg by mouth every 30 (thirty) days. Take in the morning with a full glass of water, on an empty stomach, and do not take anything else by mouth or lie down for the next 30 min., Disp: , Rfl:  .  Ketoprofen 10 % CREA, Apply to area of pain 1 to 2 times  per day., Disp: 60 g, Rfl: 0 .  lisinopril (ZESTRIL) 5 MG tablet, Take 5 mg by mouth daily., Disp: , Rfl:  .  loratadine (CLARITIN) 10 MG tablet, Take 1 tablet (10 mg total) by mouth daily., Disp: 30 tablet, Rfl: 1 .  potassium chloride SA (KLOR-CON) 20 MEQ tablet, Take 1 tablet (20 mEq total) by mouth 2 (two) times daily., Disp: 8 tablet, Rfl: 0 .  warfarin (COUMADIN) 5 MG tablet, Take '15mg'$  Mondays, Wednesdays and Fridays w/ '10mg'$  daily all other days, Disp: 180 tablet, Rfl: 1 Social History   Socioeconomic History  . Marital status: Married    Spouse name: Not on file  . Number of children: Not on file  . Years of education: Not on file  . Highest education level: Not on file  Occupational History  . Not on file  Social Needs  . Financial resource strain: Not on file  . Food insecurity    Worry: Not on file    Inability: Not on file  . Transportation needs    Medical: Not on file    Non-medical: Not on file  Tobacco Use  . Smoking status: Never Smoker  . Smokeless tobacco: Never Used  Substance and Sexual Activity  . Alcohol use: Not Currently    Alcohol/week: 0.0 standard drinks  . Drug use:  Not Currently  . Sexual activity: Not on file  Lifestyle  . Physical activity    Days per week: Not on file    Minutes per session: Not on file  . Stress: Not on file  Relationships  . Social Herbalist on phone: Not on file    Gets together: Not on file    Attends religious service: Not on file    Active member of club or organization: Not on file    Attends meetings of clubs or organizations: Not on file    Relationship status: Not on file  . Intimate partner violence    Fear of current or ex partner: Not on file    Emotionally abused: Not on file    Physically abused: Not on file    Forced sexual activity: Not on file  Other Topics Concern  . Not on file  Social History Narrative  . Not on file   No family history on file.  Objective: Office vital signs  reviewed. BP (!) 146/95   Pulse 90   Temp (!) 97.3 F (36.3 C) (Temporal)   Ht '5\' 2"'$  (1.575 m)   Wt 124 lb (56.2 kg)   BMI 22.68 kg/m   Physical Examination:  General: Awake, alert, well nourished, well appearing.  No acute distress HEENT: sclera white, MMM Cardio: RRR  Pulm: Normal work of breathing on room air Extremities: No edema.  Normal tone.  Assessment/ Plan: 63 y.o. female   1. Supratherapeutic INR INR supratherapeutic at 3.5 today.  I recommended that she only take 5 mg today and resume her normal dosing schedule of 15 mg Mondays, Wednesdays and Fridays with 10 mg daily all other days.  She may return in 2 weeks for recheck.  Hopefully her INR will be back in her normal range at that point we can resume every 6 weeks checks.  2. Factor V Leiden mutation ([place]) - CoaguChek XS/INR Waived   No orders of the defined types were placed in this encounter.  No orders of the defined types were placed in this encounter.    [name], DO [place] [phone]

## 2019-04-12 ENCOUNTER — Other Ambulatory Visit: Payer: Self-pay | Admitting: Cardiology

## 2019-04-21 ENCOUNTER — Ambulatory Visit: Payer: BC Managed Care – PPO | Admitting: Family Medicine

## 2019-04-21 ENCOUNTER — Encounter: Payer: Self-pay | Admitting: Family Medicine

## 2019-04-21 ENCOUNTER — Other Ambulatory Visit: Payer: Self-pay

## 2019-04-21 VITALS — BP 128/76 | HR 61 | Temp 97.5°F | Ht 62.0 in | Wt 124.0 lb

## 2019-04-21 DIAGNOSIS — R791 Abnormal coagulation profile: Secondary | ICD-10-CM

## 2019-04-21 DIAGNOSIS — D6851 Activated protein C resistance: Secondary | ICD-10-CM | POA: Diagnosis not present

## 2019-04-21 DIAGNOSIS — Z7901 Long term (current) use of anticoagulants: Secondary | ICD-10-CM

## 2019-04-21 LAB — COAGUCHEK XS/INR WAIVED
INR: 3.3 — ABNORMAL HIGH (ref 0.9–1.1)
Prothrombin Time: 39.6 s

## 2019-04-21 LAB — POCT INR: INR: 3.3 — AB (ref 2–3)

## 2019-04-21 NOTE — Progress Notes (Signed)
Subjective: CC: Factor V Leiden PCP: [name], DO HPI:[name] is a 63 y.o. female presenting to clinic today for:  1. Factor V Leiden History: Factor V Leiden.  She sustained 2 clots in her lifetime, once in 2004 and once in 2006.  She reports both of these were cerebral clots.  She is on chronic anticoagulation with warfarin.  Goal INR is 2-3.    Last INR was supratherapeutic at 3.5.  She was instructed to skip a day and then resume her previous dosing previous dosing 15 mg 3 days/week with 10 mg daily all other days per week.  She has not yet resumed her previous diet which did incorporate greens a couple of times per week.  She denies any hematochezia, melena, abnormal vaginal bleeding or bleeding with brushing her teeth.  ROS: Per HPI  Allergies  Allergen Reactions  . Codeine   . Donnatal [Belladonna Alk-Phenobarb Er]   . Librium    Past Medical History:  Diagnosis Date  . Anemia     Current Outpatient Medications:  .  diclofenac sodium (VOLTAREN) 1 % GEL, APPLY 1 GRAM TO AFFECTED AREA TWICE DAILY AS NEEDED, Disp: 100 g, Rfl: 2 .  enoxaparin (LOVENOX) 60 MG/0.6ML injection, Inject 0.55 mLs (55 mg total) into the skin every 12 (twelve) hours. As directed.  Hold pm dose evening before colonoscopy., Disp: 12 mL, Rfl: 0 .  ESTRACE VAGINAL 0.1 MG/GM vaginal cream, Apply vaginally as directed, Disp: , Rfl:  .  fluticasone (FLONASE) 50 MCG/ACT nasal spray, Place 2 sprays into the nose daily. Reported on 05/13/2015, Disp: , Rfl:  .  hydrochlorothiazide (MICROZIDE) 12.5 MG capsule, TAKE ONE (1) CAPSULE EACH DAY, Disp: 90 capsule, Rfl: 0 .  ibandronate (BONIVA) 150 MG tablet, Take 150 mg by mouth every 30 (thirty) days. Take in the morning with a full glass of water, on an empty stomach, and do not take anything else by mouth or lie down for the next 30 min., Disp: , Rfl:  .  Ketoprofen 10 % CREA, Apply to area of pain 1 to 2 times per day., Disp: 60 g, Rfl: 0 .   lisinopril (ZESTRIL) 5 MG tablet, Take 5 mg by mouth daily., Disp: , Rfl:  .  loratadine (CLARITIN) 10 MG tablet, Take 1 tablet (10 mg total) by mouth daily., Disp: 30 tablet, Rfl: 1 .  potassium chloride SA (KLOR-CON) 20 MEQ tablet, Take 1 tablet (20 mEq total) by mouth 2 (two) times daily., Disp: 8 tablet, Rfl: 0 .  warfarin (COUMADIN) 5 MG tablet, Take '15mg'$  Mondays, Wednesdays and Fridays w/ '10mg'$  daily all other days, Disp: 180 tablet, Rfl: 1 Social History   Socioeconomic History  . Marital status: Married    Spouse name: Not on file  . Number of children: Not on file  . Years of education: Not on file  . Highest education level: Not on file  Occupational History  . Not on file  Social Needs  . Financial resource strain: Not on file  . Food insecurity    Worry: Not on file    Inability: Not on file  . Transportation needs    Medical: Not on file    Non-medical: Not on file  Tobacco Use  . Smoking status: Never Smoker  . Smokeless tobacco: Never Used  Substance and Sexual Activity  . Alcohol use: Not Currently    Alcohol/week: 0.0 standard drinks  . Drug use: Not Currently  . Sexual activity:  Not on file  Lifestyle  . Physical activity    Days per week: Not on file    Minutes per session: Not on file  . Stress: Not on file  Relationships  . Social Herbalist on phone: Not on file    Gets together: Not on file    Attends religious service: Not on file    Active member of club or organization: Not on file    Attends meetings of clubs or organizations: Not on file    Relationship status: Not on file  . Intimate partner violence    Fear of current or ex partner: Not on file    Emotionally abused: Not on file    Physically abused: Not on file    Forced sexual activity: Not on file  Other Topics Concern  . Not on file  Social History Narrative  . Not on file   History reviewed. No pertinent family history.  Objective: Office vital signs reviewed. BP  128/76   Pulse 61   Temp (!) 97.5 F (36.4 C) (Temporal)   Ht '5\' 2"'$  (1.575 m)   Wt 124 lb (56.2 kg)   BMI 22.68 kg/m   Physical Examination:  General: Awake, alert, well nourished, well appearing.  No acute distress HEENT: sclera white, MMM Cardio: RRR  Pulm: Normal work of breathing on room air. No wheezing  Assessment/ Plan: 63 y.o. female   1. Factor V Leiden mutation ([place])  INR continues to be slightly supratherapeutic at 3.3 today.  She has mentioned that she has not resumed her normal diet which did incorporate green smoothies at least twice weekly.  I will keep her regimen the same at 15 mg Monday Wednesday Friday with 10 mg daily all other days.  She is to resume her previous diet and we will recheck in about 2 weeks, she is unable to come in 1 week. - inr FINGERSTICK - POCT INR  2. Supratherapeutic INR  3. Warfarin anticoagulation   Orders Placed This Encounter  Procedures  . inr FINGERSTICK   No orders of the defined types were placed in this encounter.    [name], DO [place] [phone]

## 2019-05-04 ENCOUNTER — Other Ambulatory Visit: Payer: Self-pay

## 2019-05-05 ENCOUNTER — Other Ambulatory Visit: Payer: Self-pay

## 2019-05-05 ENCOUNTER — Ambulatory Visit: Payer: BC Managed Care – PPO | Admitting: Family Medicine

## 2019-05-05 ENCOUNTER — Encounter: Payer: Self-pay | Admitting: Family Medicine

## 2019-05-05 VITALS — BP 138/78 | HR 78 | Temp 97.8°F | Ht 62.0 in | Wt 124.0 lb

## 2019-05-05 DIAGNOSIS — D6851 Activated protein C resistance: Secondary | ICD-10-CM

## 2019-05-05 DIAGNOSIS — Z7901 Long term (current) use of anticoagulants: Secondary | ICD-10-CM | POA: Diagnosis not present

## 2019-05-05 LAB — COAGUCHEK XS/INR WAIVED
INR: 3 — ABNORMAL HIGH (ref 0.9–1.1)
Prothrombin Time: 35.7 s

## 2019-05-05 NOTE — Progress Notes (Signed)
Subjective: CC: Factor V Leiden PCP: [name], DO HPI:[name] is a 63 y.o. female presenting to clinic today for:  1. Factor V Leiden History: Factor V Leiden.  She sustained 2 clots in her lifetime, once in 2004 and once in 2006.  She reports both of these were cerebral clots.  She is on chronic anticoagulation with warfarin.  Goal INR is 2-3.    Last INR was supratherapeutic at 3.3.  She had not yet added back her vegetables/ salads so she was instructed to continue dosing 15 mg 3 days/week with 10 mg daily all other days per week. She is eating spinach a few times per week now. Denies any hematochezia, melena, abnormal vaginal bleeding.   ROS: Per HPI  Allergies  Allergen Reactions  . Codeine   . Donnatal [Belladonna Alk-Phenobarb Er]   . Librium    Past Medical History:  Diagnosis Date  . Anemia     Current Outpatient Medications:  .  diclofenac sodium (VOLTAREN) 1 % GEL, APPLY 1 GRAM TO AFFECTED AREA TWICE DAILY AS NEEDED, Disp: 100 g, Rfl: 2 .  enoxaparin (LOVENOX) 60 MG/0.6ML injection, Inject 0.55 mLs (55 mg total) into the skin every 12 (twelve) hours. As directed.  Hold pm dose evening before colonoscopy., Disp: 12 mL, Rfl: 0 .  ESTRACE VAGINAL 0.1 MG/GM vaginal cream, Apply vaginally as directed, Disp: , Rfl:  .  fluticasone (FLONASE) 50 MCG/ACT nasal spray, Place 2 sprays into the nose daily. Reported on 05/13/2015, Disp: , Rfl:  .  hydrochlorothiazide (MICROZIDE) 12.5 MG capsule, TAKE ONE (1) CAPSULE EACH DAY, Disp: 90 capsule, Rfl: 0 .  ibandronate (BONIVA) 150 MG tablet, Take 150 mg by mouth every 30 (thirty) days. Take in the morning with a full glass of water, on an empty stomach, and do not take anything else by mouth or lie down for the next 30 min., Disp: , Rfl:  .  Ketoprofen 10 % CREA, Apply to area of pain 1 to 2 times per day., Disp: 60 g, Rfl: 0 .  lisinopril (ZESTRIL) 5 MG tablet, Take 5 mg by mouth daily., Disp: , Rfl:  .  loratadine  (CLARITIN) 10 MG tablet, Take 1 tablet (10 mg total) by mouth daily., Disp: 30 tablet, Rfl: 1 .  potassium chloride SA (KLOR-CON) 20 MEQ tablet, Take 1 tablet (20 mEq total) by mouth 2 (two) times daily., Disp: 8 tablet, Rfl: 0 .  warfarin (COUMADIN) 5 MG tablet, Take 67m Mondays, Wednesdays and Fridays w/ 157mdaily all other days, Disp: 180 tablet, Rfl: 1 Social History   Socioeconomic History  . Marital status: Married    Spouse name: Not on file  . Number of children: Not on file  . Years of education: Not on file  . Highest education level: Not on file  Occupational History  . Not on file  Tobacco Use  . Smoking status: Never Smoker  . Smokeless tobacco: Never Used  Substance and Sexual Activity  . Alcohol use: Not Currently    Alcohol/week: 0.0 standard drinks  . Drug use: Not Currently  . Sexual activity: Not on file  Other Topics Concern  . Not on file  Social History Narrative  . Not on file   Social Determinants of Health   Financial Resource Strain:   . Difficulty of Paying Living Expenses: Not on file  Food Insecurity:   . Worried About RuCharity fundraisern the Last Year: Not on file  .  Ran Out of Food in the Last Year: Not on file  Transportation Needs:   . Lack of Transportation (Medical): Not on file  . Lack of Transportation (Non-Medical): Not on file  Physical Activity:   . Days of Exercise per Week: Not on file  . Minutes of Exercise per Session: Not on file  Stress:   . Feeling of Stress : Not on file  Social Connections:   . Frequency of Communication with Friends and Family: Not on file  . Frequency of Social Gatherings with Friends and Family: Not on file  . Attends Religious Services: Not on file  . Active Member of Clubs or Organizations: Not on file  . Attends Archivist Meetings: Not on file  . Marital Status: Not on file  Intimate Partner Violence:   . Fear of Current or Ex-Partner: Not on file  . Emotionally Abused: Not on  file  . Physically Abused: Not on file  . Sexually Abused: Not on file   No family history on file.  Objective: Office vital signs reviewed. BP 138/78   Pulse 78   Temp 97.8 F (36.6 C) (Temporal)   Ht _0  (1.575 m)   BMI 22.68 kg/m   Physical Examination:  General: Awake, alert, well nourished, well appearing.  No acute distress HEENT: sclera white, MMM Cardio: Regular rate and rhythm.  S1-S2 heard.  No murmurs. Pulm: Clear to auscultation bilaterally.  Normal work of breathing on room air. No wheezing  Assessment/ Plan: 63 y.o. female   1. Factor V Leiden mutation ([place]) INR is now therapeutic at 3.0 today.  She will continue current regimen.  Okay to continue current diet.  Follow-up in 6 weeks, sooner if needed - inr FINGERSTICK  2. Warfarin anticoagulation - inr FINGERSTICK   No orders of the defined types were placed in this encounter.  No orders of the defined types were placed in this encounter.    [name], DO [place] [phone]

## 2019-05-24 ENCOUNTER — Other Ambulatory Visit: Payer: Self-pay | Admitting: Family Medicine

## 2019-06-17 ENCOUNTER — Other Ambulatory Visit: Payer: Self-pay | Admitting: Family Medicine

## 2019-06-22 ENCOUNTER — Other Ambulatory Visit: Payer: Self-pay

## 2019-06-23 ENCOUNTER — Ambulatory Visit: Payer: BC Managed Care – PPO | Admitting: Family Medicine

## 2019-06-23 ENCOUNTER — Encounter: Payer: Self-pay | Admitting: Family Medicine

## 2019-06-23 VITALS — BP 137/82 | HR 63 | Temp 98.2°F | Ht 62.0 in | Wt 127.0 lb

## 2019-06-23 DIAGNOSIS — Z7901 Long term (current) use of anticoagulants: Secondary | ICD-10-CM | POA: Diagnosis not present

## 2019-06-23 DIAGNOSIS — Z23 Encounter for immunization: Secondary | ICD-10-CM

## 2019-06-23 DIAGNOSIS — E871 Hypo-osmolality and hyponatremia: Secondary | ICD-10-CM

## 2019-06-23 DIAGNOSIS — D6851 Activated protein C resistance: Secondary | ICD-10-CM | POA: Diagnosis not present

## 2019-06-23 LAB — COAGUCHEK XS/INR WAIVED
INR: 3 — ABNORMAL HIGH (ref 0.9–1.1)
Prothrombin Time: 35.9 s

## 2019-06-23 MED ORDER — WARFARIN SODIUM 5 MG PO TABS: ORAL_TABLET | ORAL | 1 refills | Status: DC

## 2019-06-23 NOTE — Addendum Note (Signed)
Addended byDory Peru on: 06/23/2019 04:50 PM   Modules accepted: Orders

## 2019-06-23 NOTE — Progress Notes (Signed)
Subjective: CC: Factor V Leiden PCP: [name], DO HPI:[name] is a 64 y.o. female presenting to clinic today for:  1. Factor V Leiden History: Factor V Leiden.  She sustained 2 clots in her lifetime, once in 2004 and once in 2006.  She reports both of these were cerebral clots.  She is on chronic anticoagulation with warfarin.  Goal INR is 2-3.    Last INR was therapeutic at 3.0.  She is compliant with Warfarin 15 mg 3 days/week with 10 mg daily all other days per week. She continues to incorporate vegetables/ salads into her diet.  Tolerating exercise without difficulty. Denies any hematochezia, melena, hematuria, abnormal vaginal bleeding.   ROS: Per HPI  Allergies  Allergen Reactions  . Codeine   . Donnatal [Belladonna Alk-Phenobarb Er]   . Librium    Past Medical History:  Diagnosis Date  . Anemia     Current Outpatient Medications:  .  diclofenac sodium (VOLTAREN) 1 % GEL, APPLY 1 GRAM TO AFFECTED AREA TWICE DAILY AS NEEDED, Disp: 100 g, Rfl: 2 .  enoxaparin (LOVENOX) 60 MG/0.6ML injection, Inject 0.55 mLs (55 mg total) into the skin every 12 (twelve) hours. As directed.  Hold pm dose evening before colonoscopy., Disp: 12 mL, Rfl: 0 .  ESTRACE VAGINAL 0.1 MG/GM vaginal cream, Apply vaginally as directed, Disp: , Rfl:  .  fluticasone (FLONASE) 50 MCG/ACT nasal spray, Place 2 sprays into the nose daily. Reported on 05/13/2015, Disp: , Rfl:  .  hydrochlorothiazide (MICROZIDE) 12.5 MG capsule, TAKE ONE (1) CAPSULE EACH DAY, Disp: 90 capsule, Rfl: 0 .  ibandronate (BONIVA) 150 MG tablet, Take 150 mg by mouth every 30 (thirty) days. Take in the morning with a full glass of water, on an empty stomach, and do not take anything else by mouth or lie down for the next 30 min., Disp: , Rfl:  .  Ketoprofen 10 % CREA, Apply to area of pain 1 to 2 times per day., Disp: 60 g, Rfl: 0 .  lisinopril (ZESTRIL) 5 MG tablet, Take 5 mg by mouth daily., Disp: , Rfl:  .  loratadine  (CLARITIN) 10 MG tablet, Take 1 tablet (10 mg total) by mouth daily., Disp: 30 tablet, Rfl: 1 .  potassium chloride SA (KLOR-CON) 20 MEQ tablet, Take 1 tablet (20 mEq total) by mouth 2 (two) times daily., Disp: 8 tablet, Rfl: 0 .  warfarin (COUMADIN) 5 MG tablet, Take '15mg'$  Mondays, Wednesdays and Fridays w/ '10mg'$  daily all other days, Disp: 180 tablet, Rfl: 1 Social History   Socioeconomic History  . Marital status: Married    Spouse name: Not on file  . Number of children: Not on file  . Years of education: Not on file  . Highest education level: Not on file  Occupational History  . Not on file  Tobacco Use  . Smoking status: Never Smoker  . Smokeless tobacco: Never Used  Substance and Sexual Activity  . Alcohol use: Not Currently    Alcohol/week: 0.0 standard drinks  . Drug use: Not Currently  . Sexual activity: Not on file  Other Topics Concern  . Not on file  Social History Narrative  . Not on file   Social Determinants of Health   Financial Resource Strain:   . Difficulty of Paying Living Expenses: Not on file  Food Insecurity:   . Worried About Charity fundraiser in the Last Year: Not on file  . Ran Out of Food  in the Last Year: Not on file  Transportation Needs:   . Lack of Transportation (Medical): Not on file  . Lack of Transportation (Non-Medical): Not on file  Physical Activity:   . Days of Exercise per Week: Not on file  . Minutes of Exercise per Session: Not on file  Stress:   . Feeling of Stress : Not on file  Social Connections:   . Frequency of Communication with Friends and Family: Not on file  . Frequency of Social Gatherings with Friends and Family: Not on file  . Attends Religious Services: Not on file  . Active Member of Clubs or Organizations: Not on file  . Attends Archivist Meetings: Not on file  . Marital Status: Not on file  Intimate Partner Violence:   . Fear of Current or Ex-Partner: Not on file  . Emotionally Abused: Not on  file  . Physically Abused: Not on file  . Sexually Abused: Not on file   No family history on file.  Objective: Office vital signs reviewed. BP 137/82   Pulse 63   Temp 98.2 F (36.8 C) (Temporal)   Ht '5\' 2"'$  (1.575 m)   Wt 127 lb (57.6 kg)   SpO2 98%   BMI 23.23 kg/m   Physical Examination:  General: Awake, alert, well nourished, well appearing.  No acute distress HEENT: sclera white, MMM Cardio: Regular rate and rhythm.  S1-S2 heard.  No murmurs. Pulm: Clear to auscultation bilaterally.  Normal work of breathing on room air. No wheezing  Assessment/ Plan: 64 y.o. female   1. Factor V Leiden mutation ([place]) INR therapeutic at 3.0 today.  Continue current regimen.  Follow-up in 6 weeks - CoaguChek XS/INR Waived  2. Warfarin anticoagulation - CoaguChek XS/INR Waived  3. Hyponatremia Likely due to dehydration in the setting of colonoscopy prep.  Will check BMP to ensure normalization of Na level. - Basic Metabolic Panel  4. Need for shingles vaccine #2/2 Administered today.   No orders of the defined types were placed in this encounter.  Meds ordered this encounter  Medications  . warfarin (COUMADIN) 5 MG tablet    Sig: Take '15mg'$  Mondays, Wednesdays and Fridays w/ '10mg'$  daily all other days    Dispense:  180 tablet    Refill:  1    Please replace last rx.     [name], DO [place] [phone]

## 2019-06-24 LAB — BASIC METABOLIC PANEL
BUN/Creatinine Ratio: 28 (ref 12–28)
BUN: 21 mg/dL (ref 8–27)
CO2: 25 mmol/L (ref 20–29)
Calcium: 9.4 mg/dL (ref 8.7–10.3)
Chloride: 99 mmol/L (ref 96–106)
Creatinine, Ser: 0.74 mg/dL (ref 0.57–1.00)
GFR calc Af Amer: 100 mL/min/{1.73_m2} (ref >59)
GFR calc non Af Amer: 87 mL/min/{1.73_m2} (ref >59)
Glucose: 96 mg/dL (ref 65–99)
Potassium: 4.5 mmol/L (ref 3.5–5.2)
Sodium: 138 mmol/L (ref 134–144)

## 2019-07-13 DIAGNOSIS — L728 Other follicular cysts of the skin and subcutaneous tissue: Secondary | ICD-10-CM | POA: Diagnosis not present

## 2019-07-13 DIAGNOSIS — L820 Inflamed seborrheic keratosis: Secondary | ICD-10-CM | POA: Diagnosis not present

## 2019-08-05 ENCOUNTER — Encounter: Payer: Self-pay | Admitting: Family Medicine

## 2019-08-05 ENCOUNTER — Ambulatory Visit: Payer: BC Managed Care – PPO | Admitting: Family Medicine

## 2019-08-05 ENCOUNTER — Other Ambulatory Visit: Payer: Self-pay

## 2019-08-05 VITALS — BP 131/69 | HR 71 | Temp 97.9°F | Ht 62.0 in | Wt 127.0 lb

## 2019-08-05 DIAGNOSIS — D6851 Activated protein C resistance: Secondary | ICD-10-CM

## 2019-08-05 DIAGNOSIS — Z7901 Long term (current) use of anticoagulants: Secondary | ICD-10-CM | POA: Diagnosis not present

## 2019-08-05 LAB — COAGUCHEK XS/INR WAIVED
INR: 2.5 — ABNORMAL HIGH (ref 0.9–1.1)
Prothrombin Time: 30 s

## 2019-08-05 NOTE — Progress Notes (Signed)
Subjective: CC: Factor V Leiden PCP: [name], DO HPI:[name] is a 64 y.o. female presenting to clinic today for:  1. Factor V Leiden History: Factor V Leiden.  She sustained 2 clots in her lifetime, once in 2004 and once in 2006.  She reports both of these were cerebral clots.  She is on chronic anticoagulation with warfarin.  Goal INR is 2-3.    Last INR was therapeutic.  She is compliant with Warfarin 15 mg 3 days/week with 10 mg daily all other days per week. She continues to incorporate vegetables/ salads into her diet at least 2 times per week.  No bleeding, shortness of breath, change in activity tolerance.  ROS: Per HPI  Allergies  Allergen Reactions  . Codeine   . Donnatal [Belladonna Alk-Phenobarb Er]   . Librium    Past Medical History:  Diagnosis Date  . Anemia     Current Outpatient Medications:  .  diclofenac sodium (VOLTAREN) 1 % GEL, APPLY 1 GRAM TO AFFECTED AREA TWICE DAILY AS NEEDED, Disp: 100 g, Rfl: 2 .  ESTRACE VAGINAL 0.1 MG/GM vaginal cream, Apply vaginally as directed, Disp: , Rfl:  .  fluticasone (FLONASE) 50 MCG/ACT nasal spray, Place 2 sprays into the nose daily. Reported on 05/13/2015, Disp: , Rfl:  .  hydrochlorothiazide (MICROZIDE) 12.5 MG capsule, TAKE ONE (1) CAPSULE EACH DAY, Disp: 90 capsule, Rfl: 0 .  ibandronate (BONIVA) 150 MG tablet, Take 150 mg by mouth every 30 (thirty) days. Take in the morning with a full glass of water, on an empty stomach, and do not take anything else by mouth or lie down for the next 30 min., Disp: , Rfl:  .  Ketoprofen 10 % CREA, Apply to area of pain 1 to 2 times per day., Disp: 60 g, Rfl: 0 .  lisinopril (ZESTRIL) 5 MG tablet, Take 5 mg by mouth daily., Disp: , Rfl:  .  loratadine (CLARITIN) 10 MG tablet, Take 1 tablet (10 mg total) by mouth daily., Disp: 30 tablet, Rfl: 1 .  potassium chloride SA (KLOR-CON) 20 MEQ tablet, Take 1 tablet (20 mEq total) by mouth 2 (two) times daily., Disp: 8 tablet,  Rfl: 0 .  warfarin (COUMADIN) 5 MG tablet, Take '15mg'$  Mondays, Wednesdays and Fridays w/ '10mg'$  daily all other days, Disp: 180 tablet, Rfl: 1 Social History   Socioeconomic History  . Marital status: Married    Spouse name: Not on file  . Number of children: Not on file  . Years of education: Not on file  . Highest education level: Not on file  Occupational History  . Not on file  Tobacco Use  . Smoking status: Never Smoker  . Smokeless tobacco: Never Used  Substance and Sexual Activity  . Alcohol use: Not Currently    Alcohol/week: 0.0 standard drinks  . Drug use: Not Currently  . Sexual activity: Not on file  Other Topics Concern  . Not on file  Social History Narrative  . Not on file   Social Determinants of Health   Financial Resource Strain:   . Difficulty of Paying Living Expenses:   Food Insecurity:   . Worried About Charity fundraiser in the Last Year:   . Arboriculturist in the Last Year:   Transportation Needs:   . Film/video editor (Medical):   Marland Kitchen Lack of Transportation (Non-Medical):   Physical Activity:   . Days of Exercise per Week:   . Minutes  of Exercise per Session:   Stress:   . Feeling of Stress :   Social Connections:   . Frequency of Communication with Friends and Family:   . Frequency of Social Gatherings with Friends and Family:   . Attends Religious Services:   . Active Member of Clubs or Organizations:   . Attends Archivist Meetings:   Marland Kitchen Marital Status:   Intimate Partner Violence:   . Fear of Current or Ex-Partner:   . Emotionally Abused:   Marland Kitchen Physically Abused:   . Sexually Abused:    No family history on file.  Objective: Office vital signs reviewed. BP 131/69   Pulse 71   Temp 97.9 F (36.6 C) (Temporal)   Ht '5\' 2"'$  (1.575 m)   Wt 127 lb (57.6 kg)   PF 99 L/min   BMI 23.23 kg/m   Physical Examination:  General: Awake, alert, well nourished, well appearing.  No acute distress HEENT: sclera white, MMM Cardio:  Regular rate and rhythm.  S1-S2 heard.  No murmurs. Pulm: Clear to auscultation bilaterally.  Normal work of breathing on room air. No wheezing  Assessment/ Plan: 64 y.o. female   1. Factor V Leiden mutation ([place]) INR therpeutic at 2.5.  Continue current regimen.  Follow up in 6 weeks - CoaguChek XS/INR Waived  2. Warfarin anticoagulation - CoaguChek XS/INR Waived  No orders of the defined types were placed in this encounter.  No orders of the defined types were placed in this encounter.    [name], DO [place] [phone]

## 2019-08-12 ENCOUNTER — Other Ambulatory Visit: Payer: Self-pay

## 2019-08-12 ENCOUNTER — Ambulatory Visit (INDEPENDENT_AMBULATORY_CARE_PROVIDER_SITE_OTHER): Payer: BC Managed Care – PPO

## 2019-08-12 ENCOUNTER — Ambulatory Visit: Payer: BC Managed Care – PPO | Admitting: Podiatry

## 2019-08-12 DIAGNOSIS — M7751 Other enthesopathy of right foot: Secondary | ICD-10-CM

## 2019-08-12 MED ORDER — METHYLPREDNISOLONE 4 MG PO TBPK: ORAL_TABLET | ORAL | 0 refills | Status: DC

## 2019-08-16 NOTE — Progress Notes (Signed)
   HPI: 64 y.o. female presenting today as a new patient with a chief complaint of constant aching pain to the right 2nd digit that began yesterday. She reports associated swelling of the toe. Walking increases the pain. She has applied Voltaren gel to the toe for treatment. Patient is here for further evaluation and treatment.   Past Medical History:  Diagnosis Date  . Anemia      Physical Exam: General: The patient is alert and oriented x3 in no acute distress.  Dermatology: Skin is warm, dry and supple bilateral lower extremities. Negative for open lesions or macerations.  Vascular: Palpable pedal pulses bilaterally. No edema or erythema noted. Capillary refill within normal limits.  Neurological: Epicritic and protective threshold grossly intact bilaterally.   Musculoskeletal Exam: Pain with palpation noted to the right 2nd toe. Range of motion within normal limits to all pedal and ankle joints bilateral. Muscle strength 5/5 in all groups bilateral.   Radiographic Exam:  Normal osseous mineralization. Joint spaces preserved. No fracture/dislocation/boney destruction.    Assessment: 1. 2nd MPJ capsulitis right    Plan of Care:  1. Patient evaluated. X-Rays reviewed.  2. Injection of 0.5 mLs Celestone Soluspan injected into the right 2nd MPJ.  3. Post op shoe dispensed.  4. Prescription for Medrol Dose Pak provided to patient. Patient on Coumadin and cannot take NSAIDs.  5. Return to clinic as needed.     Felecia Shelling, DPM [facility]  Dr. Felecia Shelling, DPM    [address] [place], [place] [address]                Office [phone]  Fax [phone]

## 2019-08-17 ENCOUNTER — Telehealth: Payer: Self-pay | Admitting: Family Medicine

## 2019-08-17 MED ORDER — KETOPROFEN 10 % CREA: TOPICAL_CREAM | 0 refills | Status: DC

## 2019-08-17 NOTE — Telephone Encounter (Signed)
Refill sent. Patient aware.  

## 2019-08-17 NOTE — Telephone Encounter (Signed)
I sent ketoprofen refill

## 2019-08-17 NOTE — Telephone Encounter (Signed)
  Prescription Request  08/17/2019  What is the name of the medication or equipment? Ketoprofen 10 % CREA, pt was using it for tingling on her foot that comes and goes and it had come back recently and this medication worked    Have you contacted your pharmacy to request a refill? (if applicable) yes and it is no longer being made at the pharmacy she had it filled would like it to be sent [facility]   Which pharmacy would you like this sent to[facility] [phone]   Patient notified that their request is being sent to the clinical staff for review and that they should receive a response within 2 business days.

## 2019-08-19 ENCOUNTER — Telehealth: Payer: Self-pay | Admitting: Family Medicine

## 2019-08-19 ENCOUNTER — Other Ambulatory Visit: Payer: Self-pay | Admitting: Family Medicine

## 2019-08-19 MED ORDER — KETOPROFEN 10 % CREA: TOPICAL_CREAM | 0 refills | Status: DC

## 2019-08-19 NOTE — Telephone Encounter (Signed)
Patient states that [facility] does not make the compound for Ketoprofen cream- requested it be re [facility] to [place] [facility]- rx sent and patient aware.

## 2019-08-27 DIAGNOSIS — M79645 Pain in left finger(s): Secondary | ICD-10-CM | POA: Diagnosis not present

## 2019-08-27 DIAGNOSIS — M20012 Mallet finger of left finger(s): Secondary | ICD-10-CM | POA: Diagnosis not present

## 2019-09-10 DIAGNOSIS — M79645 Pain in left finger(s): Secondary | ICD-10-CM | POA: Diagnosis not present

## 2019-09-16 ENCOUNTER — Other Ambulatory Visit: Payer: Self-pay

## 2019-09-16 ENCOUNTER — Encounter: Payer: Self-pay | Admitting: Family Medicine

## 2019-09-16 ENCOUNTER — Ambulatory Visit: Payer: BC Managed Care – PPO | Admitting: Family Medicine

## 2019-09-16 VITALS — BP 136/85 | HR 68 | Temp 97.8°F | Wt 123.0 lb

## 2019-09-16 DIAGNOSIS — D6851 Activated protein C resistance: Secondary | ICD-10-CM

## 2019-09-16 DIAGNOSIS — I10 Essential (primary) hypertension: Secondary | ICD-10-CM

## 2019-09-16 DIAGNOSIS — Z7901 Long term (current) use of anticoagulants: Secondary | ICD-10-CM | POA: Diagnosis not present

## 2019-09-16 DIAGNOSIS — Z8673 Personal history of transient ischemic attack (TIA), and cerebral infarction without residual deficits: Secondary | ICD-10-CM | POA: Diagnosis not present

## 2019-09-16 LAB — COAGUCHEK XS/INR WAIVED
INR: 3.8 — ABNORMAL HIGH (ref 0.9–1.1)
Prothrombin Time: 45.2 s

## 2019-09-16 LAB — HEMOGLOBIN, FINGERSTICK: Hemoglobin: 14.2 g/dL (ref 11.1–15.9)

## 2019-09-16 NOTE — Patient Instructions (Signed)
Go down to 1 tablet today.  Then resume normal dosing.  Get some greens in.  See me next Friday

## 2019-09-16 NOTE — Progress Notes (Addendum)
Subjective: CC: Factor V Leiden, HTN PCP: [name], DO HPI:[name] is a 64 y.o. female presenting to clinic today for:  1. Factor V Leiden History: Factor V Leiden.  She sustained 2 clots in her lifetime, once in 2004 and once in 2006.  She reports both of these were cerebral clots.  She is on chronic anticoagulation with warfarin.  Goal INR is 2-3.    Last INR was therapeutic.  She is compliant with Warfarin 15 mg 3 days/week with 10 mg daily all other days per week.  Has not had any greens this week. No bleeding, shortness of breath, change in activity tolerance.  2. HTN Compliant with lisinopril.  No CP, SOB.  ROS: Per HPI  Allergies  Allergen Reactions  . Codeine   . Donnatal [Belladonna Alk-Phenobarb Er]   . Librium    Past Medical History:  Diagnosis Date  . Anemia     Current Outpatient Medications:  .  diclofenac sodium (VOLTAREN) 1 % GEL, APPLY 1 GRAM TO AFFECTED AREA TWICE DAILY AS NEEDED, Disp: 100 g, Rfl: 2 .  ESTRACE VAGINAL 0.1 MG/GM vaginal cream, Apply vaginally as directed, Disp: , Rfl:  .  fluticasone (FLONASE) 50 MCG/ACT nasal spray, Place 2 sprays into the nose daily. Reported on 05/13/2015, Disp: , Rfl:  .  hydrochlorothiazide (MICROZIDE) 12.5 MG capsule, TAKE ONE (1) CAPSULE EACH DAY, Disp: 90 capsule, Rfl: 0 .  ibandronate (BONIVA) 150 MG tablet, Take 150 mg by mouth every 30 (thirty) days. Take in the morning with a full glass of water, on an empty stomach, and do not take anything else by mouth or lie down for the next 30 min., Disp: , Rfl:  .  Ketoprofen 10 % CREA, Apply to area of pain 1 to 2 times per day., Disp: 60 g, Rfl: 0 .  lisinopril (ZESTRIL) 5 MG tablet, Take 5 mg by mouth daily., Disp: , Rfl:  .  loratadine (CLARITIN) 10 MG tablet, Take 1 tablet (10 mg total) by mouth daily., Disp: 30 tablet, Rfl: 1 .  methylPREDNISolone (MEDROL DOSEPAK) 4 MG TBPK tablet, 6 day dose pack - take as directed, Disp: 21 tablet, Rfl: 0 .   potassium chloride SA (KLOR-CON) 20 MEQ tablet, Take 1 tablet (20 mEq total) by mouth 2 (two) times daily., Disp: 8 tablet, Rfl: 0 .  warfarin (COUMADIN) 5 MG tablet, Take 14m Mondays, Wednesdays and Fridays w/ 13mdaily all other days, Disp: 180 tablet, Rfl: 1 Social History   Socioeconomic History  . Marital status: Married    Spouse name: Not on file  . Number of children: Not on file  . Years of education: Not on file  . Highest education level: Not on file  Occupational History  . Not on file  Tobacco Use  . Smoking status: Never Smoker  . Smokeless tobacco: Never Used  Substance and Sexual Activity  . Alcohol use: Not Currently    Alcohol/week: 0.0 standard drinks  . Drug use: Not Currently  . Sexual activity: Not on file  Other Topics Concern  . Not on file  Social History Narrative  . Not on file   Social Determinants of Health   Financial Resource Strain:   . Difficulty of Paying Living Expenses:   Food Insecurity:   . Worried About RuCharity fundraisern the Last Year:   . [place] the Last Year:   Transportation Needs:   . Lack of  Transportation (Medical):   Marland Kitchen Lack of Transportation (Non-Medical):   Physical Activity:   . Days of Exercise per Week:   . Minutes of Exercise per Session:   Stress:   . Feeling of Stress :   Social Connections:   . Frequency of Communication with Friends and Family:   . Frequency of Social Gatherings with Friends and Family:   . Attends Religious Services:   . Active Member of Clubs or Organizations:   . Attends Archivist Meetings:   Marland Kitchen Marital Status:   Intimate Partner Violence:   . Fear of Current or Ex-Partner:   . Emotionally Abused:   Marland Kitchen Physically Abused:   . Sexually Abused:    Family History  Problem Relation Age of Onset  . Stroke Mother   . Cancer Father   . Breast cancer Sister 9  . Diabetes Brother     Objective: Office vital signs reviewed. BP 136/85   Pulse 68   Temp 97.8 F  (36.6 C)   Wt 123 lb (55.8 kg)   BMI 22.50 kg/m   Physical Examination:  General: Awake, alert, well nourished, well appearing.  No acute distress HEENT: sclera white, MMM Cardio: Regular rate and rhythm.  S1-S2 heard.  No murmurs. Pulm: Clear to auscultation bilaterally.  Normal work of breathing on room air. No wheezing  Assessment/ Plan: 64 y.o. female   1. Factor V Leiden mutation ([facility]) INR supratherapeutic 3.8.  Likely due to change in diet.  Only take 1 tab coumadin today.  Then resume normal dosing schedule.  Go back to eating greens as she has been.  Follow up next Friday scheduled. - CoaguChek XS/INR Waived - Hemoglobin, fingerstick  2. Warfarin anticoagulation No anemia. - CoaguChek XS/INR Waived - Hemoglobin, fingerstick  3. H/O: CVA (cerebrovascular accident)  4. Hypertension, essential Controlled.   No orders of the defined types were placed in this encounter.  No orders of the defined types were placed in this encounter.    [name], DO [place] [phone]

## 2019-09-20 ENCOUNTER — Other Ambulatory Visit: Payer: Self-pay | Admitting: Family Medicine

## 2019-09-21 ENCOUNTER — Other Ambulatory Visit: Payer: Self-pay | Admitting: Cardiology

## 2019-09-24 DIAGNOSIS — M79645 Pain in left finger(s): Secondary | ICD-10-CM | POA: Diagnosis not present

## 2019-09-25 ENCOUNTER — Encounter: Payer: Self-pay | Admitting: Family Medicine

## 2019-09-25 ENCOUNTER — Other Ambulatory Visit: Payer: Self-pay

## 2019-09-25 ENCOUNTER — Ambulatory Visit: Payer: BC Managed Care – PPO | Admitting: Family Medicine

## 2019-09-25 VITALS — BP 131/81 | HR 78 | Temp 97.6°F | Ht 62.0 in | Wt 123.0 lb

## 2019-09-25 DIAGNOSIS — Z7901 Long term (current) use of anticoagulants: Secondary | ICD-10-CM

## 2019-09-25 DIAGNOSIS — D6851 Activated protein C resistance: Secondary | ICD-10-CM

## 2019-09-25 LAB — COAGUCHEK XS/INR WAIVED
INR: 3 — ABNORMAL HIGH (ref 0.9–1.1)
Prothrombin Time: 35.5 s

## 2019-09-25 NOTE — Progress Notes (Signed)
Subjective: CC: Factor V Leiden, HTN PCP: [name], DO HPI:[name] is a 64 y.o. female presenting to clinic today for:  1. Factor V Leiden History: Factor V Leiden.  She sustained 2 clots in her lifetime, once in 2004 and once in 2006.  She reports both of these were cerebral clots.  She is on chronic anticoagulation with warfarin.  Goal INR is 2-3.    Last INR was supratherapeutic.  She is compliant with Warfarin 15 mg 3 days/week with 10 mg daily all other days per week.  She was instructed to only take 5 mg on the day of her visit and then resume normal dosing with consumption of normal dietary greens.  She has been doing this and so far has been doing well since her last visit.  Does not report any bleeding.  ROS: Per HPI  Allergies  Allergen Reactions  . Codeine   . Donnatal [Belladonna Alk-Phenobarb Er]   . Librium    Past Medical History:  Diagnosis Date  . Anemia     Current Outpatient Medications:  .  diclofenac sodium (VOLTAREN) 1 % GEL, APPLY 1 GRAM TO AFFECTED AREA TWICE DAILY AS NEEDED, Disp: 100 g, Rfl: 2 .  ESTRACE VAGINAL 0.1 MG/GM vaginal cream, Apply vaginally as directed, Disp: , Rfl:  .  fluticasone (FLONASE) 50 MCG/ACT nasal spray, Place 2 sprays into the nose daily. Reported on 05/13/2015, Disp: , Rfl:  .  hydrochlorothiazide (MICROZIDE) 12.5 MG capsule, TAKE ONE (1) CAPSULE EACH DAY, Disp: 90 capsule, Rfl: 0 .  ibandronate (BONIVA) 150 MG tablet, Take 150 mg by mouth every 30 (thirty) days. Take in the morning with a full glass of water, on an empty stomach, and do not take anything else by mouth or lie down for the next 30 min., Disp: , Rfl:  .  Ketoprofen 10 % CREA, Apply to area of pain 1 to 2 times per day., Disp: 60 g, Rfl: 0 .  lisinopril (ZESTRIL) 5 MG tablet, Take 5 mg by mouth daily., Disp: , Rfl:  .  loratadine (CLARITIN) 10 MG tablet, Take 1 tablet (10 mg total) by mouth daily., Disp: 30 tablet, Rfl: 1 .  methylPREDNISolone  (MEDROL DOSEPAK) 4 MG TBPK tablet, 6 day dose pack - take as directed, Disp: 21 tablet, Rfl: 0 .  potassium chloride SA (KLOR-CON) 20 MEQ tablet, Take 1 tablet (20 mEq total) by mouth 2 (two) times daily., Disp: 8 tablet, Rfl: 0 .  warfarin (COUMADIN) 5 MG tablet, Take '15mg'$  Mondays, Wednesdays and Fridays w/ '10mg'$  daily all other days, Disp: 180 tablet, Rfl: 1 Social History   Socioeconomic History  . Marital status: Married    Spouse name: Not on file  . Number of children: Not on file  . Years of education: Not on file  . Highest education level: Not on file  Occupational History  . Not on file  Tobacco Use  . Smoking status: Never Smoker  . Smokeless tobacco: Never Used  Substance and Sexual Activity  . Alcohol use: Not Currently    Alcohol/week: 0.0 standard drinks  . Drug use: Not Currently  . Sexual activity: Not on file  Other Topics Concern  . Not on file  Social History Narrative  . Not on file   Social Determinants of Health   Financial Resource Strain:   . Difficulty of Paying Living Expenses:   Food Insecurity:   . Worried About Charity fundraiser in the  Last Year:   . [place] in the Last Year:   Transportation Needs:   . Film/video editor (Medical):   Marland Kitchen Lack of Transportation (Non-Medical):   Physical Activity:   . Days of Exercise per Week:   . Minutes of Exercise per Session:   Stress:   . Feeling of Stress :   Social Connections:   . Frequency of Communication with Friends and Family:   . Frequency of Social Gatherings with Friends and Family:   . Attends Religious Services:   . Active Member of Clubs or Organizations:   . Attends Archivist Meetings:   Marland Kitchen Marital Status:   Intimate Partner Violence:   . Fear of Current or Ex-Partner:   . Emotionally Abused:   Marland Kitchen Physically Abused:   . Sexually Abused:    Family History  Problem Relation Age of Onset  . Stroke Mother   . Cancer Father   . Breast cancer Sister 52  .  Diabetes Brother     Objective: Office vital signs reviewed. BP 131/81   Pulse 78   Temp 97.6 F (36.4 C) (Temporal)   Ht '5\' 2"'$  (1.575 m)   Wt 123 lb (55.8 kg)   SpO2 98%   BMI 22.50 kg/m   Physical Examination:  General: Awake, alert, well nourished, well appearing.  No acute distress HEENT: sclera white, MMM Cardio: Regular rate and rhythm.  S1-S2 heard.  No murmurs. Pulm: Clear to auscultation bilaterally.  Normal work of breathing on room air. No wheezing  Assessment/ Plan: 64 y.o. female   Factor V Leiden mutation ([place]) - Plan: CoaguChek XS/INR Waived  Warfarin anticoagulation  INR is therapeutic.  No changes to regimen.  May follow-up in 6 weeks, sooner if needed  Orders Placed This Encounter  Procedures  . CoaguChek XS/INR Waived   No orders of the defined types were placed in this encounter.    [name], DO [place] [phone]

## 2019-10-02 ENCOUNTER — Other Ambulatory Visit: Payer: Self-pay | Admitting: Cardiology

## 2019-10-03 ENCOUNTER — Other Ambulatory Visit: Payer: Self-pay | Admitting: Family Medicine

## 2019-10-09 DIAGNOSIS — M20012 Mallet finger of left finger(s): Secondary | ICD-10-CM | POA: Diagnosis not present

## 2019-10-20 ENCOUNTER — Ambulatory Visit: Payer: BC Managed Care – PPO | Admitting: Family Medicine

## 2019-11-06 ENCOUNTER — Ambulatory Visit: Payer: BC Managed Care – PPO | Admitting: Family Medicine

## 2019-11-06 DIAGNOSIS — M20012 Mallet finger of left finger(s): Secondary | ICD-10-CM | POA: Diagnosis not present

## 2019-11-18 ENCOUNTER — Other Ambulatory Visit: Payer: Self-pay

## 2019-11-18 ENCOUNTER — Ambulatory Visit: Payer: BC Managed Care – PPO | Admitting: Family Medicine

## 2019-11-18 VITALS — BP 128/81 | HR 78 | Temp 97.4°F | Ht 62.0 in | Wt 123.0 lb

## 2019-11-18 DIAGNOSIS — D6851 Activated protein C resistance: Secondary | ICD-10-CM | POA: Diagnosis not present

## 2019-11-18 DIAGNOSIS — Z7901 Long term (current) use of anticoagulants: Secondary | ICD-10-CM | POA: Diagnosis not present

## 2019-11-18 LAB — COAGUCHEK XS/INR WAIVED
INR: 2.3 — ABNORMAL HIGH (ref 0.9–1.1)
Prothrombin Time: 27.8 s

## 2019-11-18 NOTE — Progress Notes (Signed)
Subjective: CC: Factor V Leiden PCP: [name], DO HPI:[name] is a 64 y.o. female presenting to clinic today for:  1. Factor V Leiden History: Factor V Leiden.  She sustained 2 clots in her lifetime, once in 2004 and once in 2006.  She reports both of these were cerebral clots.  She is on chronic anticoagulation with warfarin.  Goal INR is 2-3.    Patient has been doing well since last visit.  She is compliant with her warfarin.  She continues to take 15 mg 3 days/week and 10 mg daily all other days per the week.  No changes in diet.  No changes in medications.  No hematochezia, melena, vaginal bleeding or epistaxis.  Overall she is feeling well and does not need refills.  ROS: Per HPI  Allergies  Allergen Reactions   Codeine    Donnatal [Belladonna Alk-Phenobarb Er]    Librium    Past Medical History:  Diagnosis Date   Anemia     Current Outpatient Medications:    diclofenac Sodium (VOLTAREN) 1 % GEL, APPLY 1 GRAM TO AFFECTED AREA TWICE DAILY AS NEEDED, Disp: 100 g, Rfl: 12   ESTRACE VAGINAL 0.1 MG/GM vaginal cream, Apply vaginally as directed, Disp: , Rfl:    fluticasone (FLONASE) 50 MCG/ACT nasal spray, Place 2 sprays into the nose daily. Reported on 05/13/2015, Disp: , Rfl:    hydrochlorothiazide (MICROZIDE) 12.5 MG capsule, TAKE ONE (1) CAPSULE EACH DAY, Disp: 90 capsule, Rfl: 0   ibandronate (BONIVA) 150 MG tablet, Take 150 mg by mouth every 30 (thirty) days. Take in the morning with a full glass of water, on an empty stomach, and do not take anything else by mouth or lie down for the next 30 min., Disp: , Rfl:    Ketoprofen 10 % CREA, Apply to area of pain 1 to 2 times per day., Disp: 60 g, Rfl: 0   lisinopril (ZESTRIL) 5 MG tablet, Take 5 mg by mouth daily., Disp: , Rfl:    loratadine (CLARITIN) 10 MG tablet, Take 1 tablet (10 mg total) by mouth daily., Disp: 30 tablet, Rfl: 1   potassium chloride SA (KLOR-CON) 20 MEQ tablet, Take 1 tablet (20  mEq total) by mouth 2 (two) times daily., Disp: 8 tablet, Rfl: 0   warfarin (COUMADIN) 5 MG tablet, Take '15mg'$  Mondays, Wednesdays and Fridays w/ '10mg'$  daily all other days, Disp: 180 tablet, Rfl: 1 Social History   Socioeconomic History   Marital status: Married    Spouse name: Not on file   Number of children: Not on file   Years of education: Not on file   Highest education level: Not on file  Occupational History   Not on file  Tobacco Use   Smoking status: Never Smoker   Smokeless tobacco: Never Used  Vaping Use   Vaping Use: Never used  Substance and Sexual Activity   Alcohol use: Not Currently    Alcohol/week: 0.0 standard drinks   Drug use: Not Currently   Sexual activity: Not on file  Other Topics Concern   Not on file  Social History Narrative   Not on file   Social Determinants of Health   Financial Resource Strain:    Difficulty of Paying Living Expenses:   Food Insecurity:    Worried About Estate manager/land agent of Food in the Last Year:    Ran Out of Food in the Last Year:   Transportation Needs:    Lack of Transportation (  Medical):    Lack of Transportation (Non-Medical):   Physical Activity:    Days of Exercise per Week:    Minutes of Exercise per Session:   Stress:    Feeling of Stress :   Social Connections:    Frequency of Communication with Friends and Family:    Frequency of Social Gatherings with Friends and Family:    Attends Religious Services:    Active Member of Clubs or Organizations:    Attends Music therapist:    Marital Status:   Intimate Partner Violence:    Fear of Current or Ex-Partner:    Emotionally Abused:    Physically Abused:    Sexually Abused:    Family History  Problem Relation Age of Onset   Stroke Mother    Cancer Father    Breast cancer Sister 65   Diabetes Brother     Objective: Office vital signs reviewed. BP 128/81    Pulse 78    Temp (!) 97.4 F (36.3 C) (Temporal)     Ht '5\' 2"'$  (1.575 m)    Wt 123 lb (55.8 kg)    SpO2 98%    BMI 22.50 kg/m   Physical Examination:  General: Awake, alert, well nourished, well appearing  HEENT: sclera white, MMM Cardio: Regular rate and rhythm.  S1-S2 heard.  No murmurs. Pulm: Clear to auscultation bilaterally.  Normal work of breathing on room air.   Assessment/ Plan: 64 y.o. female   1. Factor V Leiden mutation ([place]) INR therapeutic. Continue current regimen.  Follow-up in 6 to 8 weeks, sooner if needed - CoaguChek XS/INR Waived  2. Warfarin anticoagulation    Orders Placed This Encounter  Procedures   CoaguChek XS/INR Waived   No orders of the defined types were placed in this encounter.    [name], DO [place] [phone]

## 2019-11-26 DIAGNOSIS — Z13820 Encounter for screening for osteoporosis: Secondary | ICD-10-CM | POA: Diagnosis not present

## 2019-11-29 ENCOUNTER — Other Ambulatory Visit: Payer: Self-pay | Admitting: Family Medicine

## 2019-11-29 DIAGNOSIS — D6851 Activated protein C resistance: Secondary | ICD-10-CM

## 2019-12-13 ENCOUNTER — Other Ambulatory Visit: Payer: Self-pay | Admitting: Family Medicine

## 2019-12-21 DIAGNOSIS — M9903 Segmental and somatic dysfunction of lumbar region: Secondary | ICD-10-CM | POA: Diagnosis not present

## 2019-12-21 DIAGNOSIS — M9902 Segmental and somatic dysfunction of thoracic region: Secondary | ICD-10-CM | POA: Diagnosis not present

## 2019-12-21 DIAGNOSIS — M62830 Muscle spasm of back: Secondary | ICD-10-CM | POA: Diagnosis not present

## 2019-12-21 DIAGNOSIS — M9904 Segmental and somatic dysfunction of sacral region: Secondary | ICD-10-CM | POA: Diagnosis not present

## 2019-12-22 DIAGNOSIS — M62830 Muscle spasm of back: Secondary | ICD-10-CM | POA: Diagnosis not present

## 2019-12-22 DIAGNOSIS — M9903 Segmental and somatic dysfunction of lumbar region: Secondary | ICD-10-CM | POA: Diagnosis not present

## 2019-12-22 DIAGNOSIS — M9904 Segmental and somatic dysfunction of sacral region: Secondary | ICD-10-CM | POA: Diagnosis not present

## 2019-12-22 DIAGNOSIS — M9902 Segmental and somatic dysfunction of thoracic region: Secondary | ICD-10-CM | POA: Diagnosis not present

## 2019-12-23 DIAGNOSIS — M9904 Segmental and somatic dysfunction of sacral region: Secondary | ICD-10-CM | POA: Diagnosis not present

## 2019-12-23 DIAGNOSIS — M62830 Muscle spasm of back: Secondary | ICD-10-CM | POA: Diagnosis not present

## 2019-12-23 DIAGNOSIS — M9903 Segmental and somatic dysfunction of lumbar region: Secondary | ICD-10-CM | POA: Diagnosis not present

## 2019-12-23 DIAGNOSIS — M9902 Segmental and somatic dysfunction of thoracic region: Secondary | ICD-10-CM | POA: Diagnosis not present

## 2019-12-24 DIAGNOSIS — M9903 Segmental and somatic dysfunction of lumbar region: Secondary | ICD-10-CM | POA: Diagnosis not present

## 2019-12-24 DIAGNOSIS — M9902 Segmental and somatic dysfunction of thoracic region: Secondary | ICD-10-CM | POA: Diagnosis not present

## 2019-12-24 DIAGNOSIS — M62830 Muscle spasm of back: Secondary | ICD-10-CM | POA: Diagnosis not present

## 2019-12-24 DIAGNOSIS — M9904 Segmental and somatic dysfunction of sacral region: Secondary | ICD-10-CM | POA: Diagnosis not present

## 2020-01-13 ENCOUNTER — Encounter: Payer: Self-pay | Admitting: Family Medicine

## 2020-01-13 ENCOUNTER — Ambulatory Visit: Payer: BC Managed Care – PPO | Admitting: Family Medicine

## 2020-01-13 ENCOUNTER — Other Ambulatory Visit: Payer: Self-pay

## 2020-01-13 VITALS — BP 153/79 | HR 66 | Temp 97.7°F | Ht 62.0 in | Wt 126.0 lb

## 2020-01-13 DIAGNOSIS — D6851 Activated protein C resistance: Secondary | ICD-10-CM | POA: Diagnosis not present

## 2020-01-13 DIAGNOSIS — R791 Abnormal coagulation profile: Secondary | ICD-10-CM | POA: Diagnosis not present

## 2020-01-13 DIAGNOSIS — Z7901 Long term (current) use of anticoagulants: Secondary | ICD-10-CM | POA: Diagnosis not present

## 2020-01-13 DIAGNOSIS — L309 Dermatitis, unspecified: Secondary | ICD-10-CM | POA: Diagnosis not present

## 2020-01-13 LAB — POCT INR: INR: 4.3 — AB (ref 2–3)

## 2020-01-13 LAB — COAGUCHEK XS/INR WAIVED
INR: 4.3 — ABNORMAL HIGH (ref 0.9–1.1)
Prothrombin Time: 52 s

## 2020-01-13 NOTE — Progress Notes (Signed)
Subjective: CC: Factor V Leiden PCP: [name], DO HPI:[name] is a 64 y.o. female presenting to clinic today for:  1. Factor V Leiden History: Factor V Leiden.  She sustained 2 clots in her lifetime, once in 2004 and once in 2006.  She reports both of these were cerebral clots.  She is on chronic anticoagulation with warfarin.  Goal INR is 2-3.    Patient has been doing well since last visit.  Compliant with warfarin 3 tablets Monday Wednesdays and Fridays with 2 tablets daily all other days.  She has not been eating greens or drinking spinach in her smoothies like she normally does.  She denies any hematochezia, melena, vaginal bleeding or epistaxis.  2.  Ear drainage Patient reports left-sided ear drainage.  She sometimes gets soap in that ear and wonders if this is the reason.  Denies any pain.  ROS: Per HPI  Allergies  Allergen Reactions  . Codeine   . Donnatal [Belladonna Alk-Phenobarb Er]   . Librium    Past Medical History:  Diagnosis Date  . Anemia     Current Outpatient Medications:  .  diclofenac Sodium (VOLTAREN) 1 % GEL, APPLY 1 GRAM TO AFFECTED AREA TWICE DAILY AS NEEDED, Disp: 100 g, Rfl: 12 .  ESTRACE VAGINAL 0.1 MG/GM vaginal cream, Apply vaginally as directed, Disp: , Rfl:  .  fluticasone (FLONASE) 50 MCG/ACT nasal spray, Place 2 sprays into the nose daily. Reported on 05/13/2015, Disp: , Rfl:  .  hydrochlorothiazide (MICROZIDE) 12.5 MG capsule, TAKE ONE (1) CAPSULE EACH DAY, Disp: 90 capsule, Rfl: 0 .  ibandronate (BONIVA) 150 MG tablet, Take 150 mg by mouth every 30 (thirty) days. Take in the morning with a full glass of water, on an empty stomach, and do not take anything else by mouth or lie down for the next 30 min., Disp: , Rfl:  .  Ketoprofen 10 % CREA, Apply to area of pain 1 to 2 times per day., Disp: 60 g, Rfl: 0 .  lisinopril (ZESTRIL) 5 MG tablet, Take 5 mg by mouth daily., Disp: , Rfl:  .  loratadine (CLARITIN) 10 MG tablet, Take 1  tablet (10 mg total) by mouth daily., Disp: 30 tablet, Rfl: 1 .  potassium chloride SA (KLOR-CON) 20 MEQ tablet, Take 1 tablet (20 mEq total) by mouth 2 (two) times daily., Disp: 8 tablet, Rfl: 0 .  warfarin (COUMADIN) 5 MG tablet, TAKE 15MG ON MONDAYS, WEDNESDAYS, AND FRIDAYS & TAKE 10MG ALL OTHER DAYS, Disp: 180 tablet, Rfl: 1 Social History   Socioeconomic History  . Marital status: Married    Spouse name: Not on file  . Number of children: Not on file  . Years of education: Not on file  . Highest education level: Not on file  Occupational History  . Not on file  Tobacco Use  . Smoking status: Never Smoker  . Smokeless tobacco: Never Used  Vaping Use  . Vaping Use: Never used  Substance and Sexual Activity  . Alcohol use: Not Currently    Alcohol/week: 0.0 standard drinks  . Drug use: Not Currently  . Sexual activity: Not on file  Other Topics Concern  . Not on file  Social History Narrative  . Not on file   Social Determinants of Health   Financial Resource Strain:   . Difficulty of Paying Living Expenses: Not on file  Food Insecurity:   . Worried About Charity fundraiser in the Last Year:  Not on file  . Ran Out of Food in the Last Year: Not on file  Transportation Needs:   . Lack of Transportation (Medical): Not on file  . Lack of Transportation (Non-Medical): Not on file  Physical Activity:   . Days of Exercise per Week: Not on file  . Minutes of Exercise per Session: Not on file  Stress:   . Feeling of Stress : Not on file  Social Connections:   . Frequency of Communication with Friends and Family: Not on file  . Frequency of Social Gatherings with Friends and Family: Not on file  . Attends Religious Services: Not on file  . Active Member of Clubs or Organizations: Not on file  . Attends Archivist Meetings: Not on file  . Marital Status: Not on file  Intimate Partner Violence:   . Fear of Current or Ex-Partner: Not on file  . Emotionally Abused:  Not on file  . Physically Abused: Not on file  . Sexually Abused: Not on file   Family History  Problem Relation Age of Onset  . Stroke Mother   . Cancer Father   . Breast cancer Sister 46  . Diabetes Brother     Objective: Office vital signs reviewed. BP (!) 153/79   Pulse 66   Temp 97.7 F (36.5 C)   Ht _0  (1.575 m)   Wt 126 lb (57.2 kg)   SpO2 99%   BMI 23.05 kg/m   Physical Examination:  General: Awake, alert, well nourished, well appearing  HEENT: sclera white, MMM; dermatitis of left external auditory canal Cardio: Regular rate  Pulm:  Normal work of breathing on room air.   Assessment/ Plan: 64 y.o. female   1. Supratherapeutic INR Skip today's dosing of 3 tablets.  Incorporate greens back into diet.  Resume normal dosing schedule tomorrow.  Follow-up in 10 days for repeat INR - POCT INR  2. Warfarin anticoagulation - CoaguChek XS/INR Waived - POCT INR  3. Factor V Leiden mutation ([place]) - CoaguChek XS/INR Waived - POCT INR  4. Dermatitis of external ear Sweet oil applied externally.  If this not improve symptoms, will give triamcinolone to apply topically.    Orders Placed This Encounter  Procedures  . CoaguChek XS/INR Waived   No orders of the defined types were placed in this encounter.    [name], DO [place] [phone]

## 2020-01-25 NOTE — Progress Notes (Signed)
Subjective: CC: Factor V Leiden PCP: [name], DO HPI:[name] is a 64 y.o. female presenting to clinic today for:  1. Factor V Leiden History: Factor V Leiden.  She sustained 2 clots in her lifetime, once in 2004 and once in 2006.  She reports both of these were cerebral clots.  She is on chronic anticoagulation with warfarin.  Goal INR is 2-3.    Patient's INR was supratherapeutic at last visit.  She was instructed to skip that days dose of 3 tablets.  She was then supposed to resume normal diet and Coumadin schedule.  She reports that she is compliant with warfarin 3 tablets Monday Wednesdays and Fridays with 2 tablets daily all other days.  She has been incorporating more greens in her diet.  Denies any hematochezia, melena, epistaxis.  2.  Ear drainage Patient reports ongoing ear drainage now bilaterally.  They are itchy.  She is been using sweet oil but symptoms are not improving.  Denies any overt pain.  ROS: Per HPI  Allergies  Allergen Reactions  . Codeine   . Donnatal [Belladonna Alk-Phenobarb Er]   . Librium    Past Medical History:  Diagnosis Date  . Anemia     Current Outpatient Medications:  .  diclofenac Sodium (VOLTAREN) 1 % GEL, APPLY 1 GRAM TO AFFECTED AREA TWICE DAILY AS NEEDED, Disp: 100 g, Rfl: 12 .  ESTRACE VAGINAL 0.1 MG/GM vaginal cream, Apply vaginally as directed, Disp: , Rfl:  .  fluticasone (FLONASE) 50 MCG/ACT nasal spray, Place 2 sprays into the nose daily. Reported on 05/13/2015, Disp: , Rfl:  .  hydrochlorothiazide (MICROZIDE) 12.5 MG capsule, TAKE ONE (1) CAPSULE EACH DAY, Disp: 90 capsule, Rfl: 0 .  ibandronate (BONIVA) 150 MG tablet, Take 150 mg by mouth every 30 (thirty) days. Take in the morning with a full glass of water, on an empty stomach, and do not take anything else by mouth or lie down for the next 30 min., Disp: , Rfl:  .  Ketoprofen 10 % CREA, Apply to area of pain 1 to 2 times per day., Disp: 60 g, Rfl: 0 .   lisinopril (ZESTRIL) 5 MG tablet, Take 5 mg by mouth daily., Disp: , Rfl:  .  loratadine (CLARITIN) 10 MG tablet, Take 1 tablet (10 mg total) by mouth daily., Disp: 30 tablet, Rfl: 1 .  potassium chloride SA (KLOR-CON) 20 MEQ tablet, Take 1 tablet (20 mEq total) by mouth 2 (two) times daily., Disp: 8 tablet, Rfl: 0 .  warfarin (COUMADIN) 5 MG tablet, TAKE $RemoveBefo'15MG'dYZnGNiGxRM$  ON MONDAYS, WEDNESDAYS, AND FRIDAYS & TAKE $Remo'10MG'gSoDT$  ALL OTHER DAYS, Disp: 180 tablet, Rfl: 1 Social History   Socioeconomic History  . Marital status: Married    Spouse name: Not on file  . Number of children: Not on file  . Years of education: Not on file  . Highest education level: Not on file  Occupational History  . Not on file  Tobacco Use  . Smoking status: Never Smoker  . Smokeless tobacco: Never Used  Vaping Use  . Vaping Use: Never used  Substance and Sexual Activity  . Alcohol use: Not Currently    Alcohol/week: 0.0 standard drinks  . Drug use: Not Currently  . Sexual activity: Not on file  Other Topics Concern  . Not on file  Social History Narrative  . Not on file   Social Determinants of Health   Financial Resource Strain:   . Difficulty of Paying  Living Expenses: Not on file  Food Insecurity:   . Worried About Charity fundraiser in the Last Year: Not on file  . Ran Out of Food in the Last Year: Not on file  Transportation Needs:   . Lack of Transportation (Medical): Not on file  . Lack of Transportation (Non-Medical): Not on file  Physical Activity:   . Days of Exercise per Week: Not on file  . Minutes of Exercise per Session: Not on file  Stress:   . Feeling of Stress : Not on file  Social Connections:   . Frequency of Communication with Friends and Family: Not on file  . Frequency of Social Gatherings with Friends and Family: Not on file  . Attends Religious Services: Not on file  . Active Member of Clubs or Organizations: Not on file  . Attends Archivist Meetings: Not on file  . Marital  Status: Not on file  Intimate Partner Violence:   . Fear of Current or Ex-Partner: Not on file  . Emotionally Abused: Not on file  . Physically Abused: Not on file  . Sexually Abused: Not on file   Family History  Problem Relation Age of Onset  . Stroke Mother   . Cancer Father   . Breast cancer Sister 69  . Diabetes Brother     Objective: Office vital signs reviewed. BP 126/72   Pulse 73   Temp 97.8 F (36.6 C) (Temporal)   Ht $R'5\' 2"'Mi$  (1.575 m)   Wt 126 lb (57.2 kg)   SpO2 99%   BMI 23.05 kg/m   Physical Examination:  General: Awake, alert, well nourished, well appearing  HEENT: sclera white, MMM; dermatitis of bilateral external auditory canals noted.  No significant soft tissue swelling, erythema or exudates appreciated. Cardio: Regular rate  Pulm:  Normal work of breathing on room air.   Assessment/ Plan: 64 y.o. female   1. Supratherapeutic INR INR persistently supratherapeutic.  Hold today's dose of 2 tablets.  Resume 2 tablets daily dosing until next checkup on Tuesday.  If INR is at goal of 2-3, okay to continue 2 tablets daily dosing with a checkup again in 4 weeks.  If INR continues to be supratherapeutic, may need to consider skipping a day of Coumadin and resuming 2 tablets daily dosing with recheck 1 week following.  If INR subtherapeutic recommend adding back 3 tablets on Mondays and continue 2 tablets daily all other days with recheck 1 week following.  2. Factor V Leiden mutation ([place]) - CoaguChek XS/INR Waived  3. Warfarin anticoagulation - CoaguChek XS/INR Waived  4. Dermatitis of external ear Empiric treatment with Ciprodex.  Do not think that she would be able to get triamcinolone far enough into the ear to get all of the dermatitis. - ciprofloxacin-dexamethasone (CIPRODEX) OTIC suspension; Place 4 drops into both ears 2 (two) times daily. x7-10 days  Dispense: 7.5 mL; Refill: 0  No orders of the defined types were placed in this encounter.  No  orders of the defined types were placed in this encounter.    [name], DO [place] [phone]

## 2020-01-26 ENCOUNTER — Other Ambulatory Visit: Payer: Self-pay

## 2020-01-26 ENCOUNTER — Encounter: Payer: Self-pay | Admitting: Family Medicine

## 2020-01-26 ENCOUNTER — Ambulatory Visit: Payer: BC Managed Care – PPO | Admitting: Family Medicine

## 2020-01-26 VITALS — BP 126/72 | HR 73 | Temp 97.8°F | Ht 62.0 in | Wt 126.0 lb

## 2020-01-26 DIAGNOSIS — Z7901 Long term (current) use of anticoagulants: Secondary | ICD-10-CM

## 2020-01-26 DIAGNOSIS — R791 Abnormal coagulation profile: Secondary | ICD-10-CM

## 2020-01-26 DIAGNOSIS — D6851 Activated protein C resistance: Secondary | ICD-10-CM | POA: Diagnosis not present

## 2020-01-26 DIAGNOSIS — L309 Dermatitis, unspecified: Secondary | ICD-10-CM | POA: Diagnosis not present

## 2020-01-26 MED ORDER — CIPROFLOXACIN-DEXAMETHASONE 0.3-0.1 % OT SUSP: 4.0000 [drp] | Freq: Two times a day (BID) | OTIC | 0 refills | Status: DC

## 2020-01-26 NOTE — Patient Instructions (Signed)
SKIP today's Warfarin. Then start with ONLY 2 tablets daily until your recheck on Tuesday.

## 2020-01-27 LAB — COAGUCHEK XS/INR WAIVED
INR: 5.7 (ref 0.9–1.1)
Prothrombin Time: 68.5 s

## 2020-02-02 ENCOUNTER — Ambulatory Visit: Payer: BC Managed Care – PPO | Admitting: Nurse Practitioner

## 2020-02-02 ENCOUNTER — Other Ambulatory Visit: Payer: Self-pay

## 2020-02-02 ENCOUNTER — Encounter: Payer: Self-pay | Admitting: Nurse Practitioner

## 2020-02-02 VITALS — BP 143/80 | HR 70 | Temp 98.2°F | Ht 62.0 in | Wt 126.4 lb

## 2020-02-02 DIAGNOSIS — Z7901 Long term (current) use of anticoagulants: Secondary | ICD-10-CM

## 2020-02-02 DIAGNOSIS — D6851 Activated protein C resistance: Secondary | ICD-10-CM

## 2020-02-02 DIAGNOSIS — Z23 Encounter for immunization: Secondary | ICD-10-CM | POA: Diagnosis not present

## 2020-02-02 LAB — COAGUCHEK XS/INR WAIVED
INR: 2.9 — ABNORMAL HIGH (ref 0.9–1.1)
Prothrombin Time: 34.6 s

## 2020-02-02 LAB — POCT INR: INR: 2.9 (ref 2–3)

## 2020-02-02 NOTE — Progress Notes (Signed)
Subjective:     Indication: CVA Bleeding signs/symptoms: None Thromboembolic signs/symptoms: None  Missed Coumadin doses: None Medication changes: no Dietary changes: no Bacterial/viral infection: no Other concerns: no  The following portions of the patient's history were reviewed and updated as appropriate: allergies, current medications, past family history, past medical history, past social history, past surgical history and problem list.  Review of Systems Pertinent items are noted in HPI.   Objective:    INR Today: 2.9 Current dose: 10 mg Monday - Sunday      Assessment:    Therapeutic INR for goal of 2-3   Plan:    1. New dose: no change   2. Next INR: 8 weeks

## 2020-02-02 NOTE — Addendum Note (Signed)
Addended by: Ignacia Bayley on: 02/02/2020 03:46 PM   Modules accepted: Orders

## 2020-02-02 NOTE — Patient Instructions (Signed)
Prothrombin Time, International Normalized Ratio Test Why am I having this test? A prothrombin time (pro-time, PT) test may be ordered if:  You have certain medical conditions that cause abnormal bleeding or blood clotting. These can include: ? Liver disease. ? Systemic infection (sepsis). ? Inherited (genetic) bleeding disorders.  You are taking a medicine to prevent excessive blood clotting (anticoagulant), such as warfarin. ? If you are taking warfarin, you will likely be asked to have this test done at regular intervals. The results of this test will help your health care provider determine what dose of warfarin you need based on how quickly or slowly your blood clots. It is very important to have this test done as often as your health care provider recommends. What is being tested? A prothrombin time (pro-time, PT) test measures how many seconds it takes your blood to clot. The international normalized ratio (INR) is a calculation of blood clotting time based on your PT result. Most labs report both PT and INR values when reporting blood clotting times. What kind of sample is taken?  A blood sample is required for this test. It is usually collected by inserting a needle into a blood vessel. Tell a health care provider about:  Any blood disorders you have.  All medicines you are taking, including vitamins, herbs, eye drops, creams, and over-the-counter medicines. Do not stop, add, or change any medicines without letting your health care provider know.  The foods you regularly eat, especially foods that contain moderate or high amounts of vitamin K. It is important to eat a consistent amount of foods rich in vitamin K. Let your health care provider know if you have recently changed your diet.  If you drink alcohol. This can affect your lab results. How are the results reported? Your test results will be reported as values. Your health care provider will compare your results to normal  ranges that were established after testing a large group of people (reference ranges). Reference ranges may vary among different labs and hospitals. For this test, common reference ranges are:  Without anticoagulant treatment (control value): 11.0-12.5 seconds; 85-100%.  INR: 0.8-1.1. If you are taking warfarin, talk with your health care provider about what your INR result should be. Generally, an INR of 2.0-3.0 is desired for blood clot prevention. This depends on your medical conditions. What do the results mean?  A higher than normal PT or INR means that your blood takes longer to form a clot. This can result from: ? Certain medicines. ? Liver disease. ? Lack of certain proteins that form clots (coagulation factors). ? Lack of some vitamins.  A lower than normal PT or INR means that your blood can form a clot easily. This can result from: ? Supplements that contain Vitamin K. ? Medicines that contain estrogen, such as birth control pills or hormone replacement. ? Cancer. ? Some blood disorders (disseminated intravascular coagulation). Talk with your health care provider about what your test results mean. If you are taking warfarin or another anticoagulant, your result ranges may be different. Talk to your health care provider about what your results should be. Questions to ask your health care provider Ask your health care provider or the department that is doing the test:  When will my results be ready?  How will I get my results?  What are my treatment options?  What other tests do I need?  What are my next steps? Summary  A prothrombin time (pro-time, PT) test measures how   many seconds it takes your blood to clot.  You may have this test if you have a medical condition that causes abnormal bleeding or blood clotting, or if you are taking a medicine to prevent abnormal blood clotting.  A test result that is higher than normal indicates that your blood is taking too long  to form a clot. This result may occur because you lack some vitamins, take certain medicines, or have certain medical conditions.  Talk with your health care provider about what your results mean. This information is not intended to replace advice given to you by your health care provider. Make sure you discuss any questions you have with your health care provider. Document Revised: 06/22/2017 Document Reviewed: 06/22/2017 Elsevier Patient Education  2020 [employer]  

## 2020-02-02 NOTE — Assessment & Plan Note (Signed)
Patient presents for INR check.  Patient is within therapeutic limits.  No signs and symptoms of bleeding or thromboembolic signs.  No missed Coumadin doses,medication or dietary changes.  Provided education to patient with printed handouts given.  Patient will continue on current dose of 10 mg tablet daily Monday through Sunday.  And follow-up in 8 weeks.

## 2020-02-15 ENCOUNTER — Other Ambulatory Visit: Payer: Self-pay | Admitting: Family Medicine

## 2020-02-15 DIAGNOSIS — D6851 Activated protein C resistance: Secondary | ICD-10-CM

## 2020-02-29 DIAGNOSIS — Z01419 Encounter for gynecological examination (general) (routine) without abnormal findings: Secondary | ICD-10-CM | POA: Diagnosis not present

## 2020-02-29 DIAGNOSIS — Z6823 Body mass index (BMI) 23.0-23.9, adult: Secondary | ICD-10-CM | POA: Diagnosis not present

## 2020-02-29 DIAGNOSIS — Z1231 Encounter for screening mammogram for malignant neoplasm of breast: Secondary | ICD-10-CM | POA: Diagnosis not present

## 2020-03-06 ENCOUNTER — Other Ambulatory Visit: Payer: Self-pay | Admitting: Family Medicine

## 2020-03-07 ENCOUNTER — Other Ambulatory Visit: Payer: Self-pay | Admitting: Family Medicine

## 2020-03-07 DIAGNOSIS — L309 Dermatitis, unspecified: Secondary | ICD-10-CM

## 2020-04-01 ENCOUNTER — Encounter: Payer: Self-pay | Admitting: Family Medicine

## 2020-04-01 ENCOUNTER — Other Ambulatory Visit: Payer: Self-pay

## 2020-04-01 ENCOUNTER — Ambulatory Visit: Payer: BC Managed Care – PPO | Admitting: Family Medicine

## 2020-04-01 VITALS — BP 120/71 | HR 69 | Temp 97.6°F | Ht 62.0 in | Wt 127.2 lb

## 2020-04-01 DIAGNOSIS — D6851 Activated protein C resistance: Secondary | ICD-10-CM

## 2020-04-01 DIAGNOSIS — Z7901 Long term (current) use of anticoagulants: Secondary | ICD-10-CM

## 2020-04-01 LAB — HEMOGLOBIN, FINGERSTICK: Hemoglobin: 14.4 g/dL (ref 11.1–15.9)

## 2020-04-01 LAB — COAGUCHEK XS/INR WAIVED
INR: 2.2 — ABNORMAL HIGH (ref 0.9–1.1)
Prothrombin Time: 26.9 s

## 2020-04-01 NOTE — Progress Notes (Signed)
Subjective: CC: Factor V Leiden, INR check PCP: [name], DO HPI:[name] is a 64 y.o. female presenting to clinic today for:  1. Factor V Leiden History: Factor V Leiden.  She sustained 2 clots in her lifetime, once in 2004 and once in 2006.  She reports both of these were cerebral clots.  She is on chronic anticoagulation with warfarin.  Goal INR is 2-3.    Patient's INR was therapeutic at last visit.  She was instructed to continue 2 tablets of Coumadin daily.  She reports compliance with 10 mg daily of Coumadin.  No complications.  She still has been incorporating some greens in her diet.  No chest pain, shortness of breath, hematochezia, melena, epistaxis or vaginal bleeding.  ROS: Per HPI  Allergies  Allergen Reactions  . Codeine   . Donnatal [Belladonna Alk-Phenobarb Er]   . Librium    Past Medical History:  Diagnosis Date  . Anemia     Current Outpatient Medications:  .  ciprofloxacin-dexamethasone (CIPRODEX) OTIC suspension, Place 4 drops into both ears 2 (two) times daily. x7-10 days, Disp: 7.5 mL, Rfl: 0 .  diclofenac Sodium (VOLTAREN) 1 % GEL, APPLY 1 GRAM TO AFFECTED AREA TWICE DAILY AS NEEDED, Disp: 100 g, Rfl: 12 .  ESTRACE VAGINAL 0.1 MG/GM vaginal cream, Apply vaginally as directed, Disp: , Rfl:  .  fluticasone (FLONASE) 50 MCG/ACT nasal spray, Place 2 sprays into the nose daily. Reported on 05/13/2015, Disp: , Rfl:  .  hydrochlorothiazide (MICROZIDE) 12.5 MG capsule, TAKE ONE (1) CAPSULE EACH DAY, Disp: 90 capsule, Rfl: 0 .  ibandronate (BONIVA) 150 MG tablet, Take 150 mg by mouth every 30 (thirty) days. Take in the morning with a full glass of water, on an empty stomach, and do not take anything else by mouth or lie down for the next 30 min., Disp: , Rfl:  .  Ketoprofen 10 % CREA, Apply to area of pain 1 to 2 times per day., Disp: 60 g, Rfl: 0 .  lisinopril (ZESTRIL) 5 MG tablet, Take 5 mg by mouth daily., Disp: , Rfl:  .  loratadine (CLARITIN)  10 MG tablet, Take 1 tablet (10 mg total) by mouth daily., Disp: 30 tablet, Rfl: 1 .  potassium chloride SA (KLOR-CON) 20 MEQ tablet, Take 1 tablet (20 mEq total) by mouth 2 (two) times daily., Disp: 8 tablet, Rfl: 0 .  warfarin (COUMADIN) 5 MG tablet, TAKE $RemoveBefo'15MG'WnrweUTbRJN$  ON MONDAYS, WEDNESDAYS, AND FRIDAYS & TAKE $Remo'10MG'kpoqz$  ALL OTHER DAYS, Disp: 180 tablet, Rfl: 0 Social History   Socioeconomic History  . Marital status: Married    Spouse name: Not on file  . Number of children: Not on file  . Years of education: Not on file  . Highest education level: Not on file  Occupational History  . Not on file  Tobacco Use  . Smoking status: Never Smoker  . Smokeless tobacco: Never Used  Vaping Use  . Vaping Use: Never used  Substance and Sexual Activity  . Alcohol use: Not Currently    Alcohol/week: 0.0 standard drinks  . Drug use: Not Currently  . Sexual activity: Not on file  Other Topics Concern  . Not on file  Social History Narrative  . Not on file   Social Determinants of Health   Financial Resource Strain:   . Difficulty of Paying Living Expenses: Not on file  Food Insecurity:   . Worried About Charity fundraiser in the Last Year: Not  on file  . Ran Out of Food in the Last Year: Not on file  Transportation Needs:   . Lack of Transportation (Medical): Not on file  . Lack of Transportation (Non-Medical): Not on file  Physical Activity:   . Days of Exercise per Week: Not on file  . Minutes of Exercise per Session: Not on file  Stress:   . Feeling of Stress : Not on file  Social Connections:   . Frequency of Communication with Friends and Family: Not on file  . Frequency of Social Gatherings with Friends and Family: Not on file  . Attends Religious Services: Not on file  . Active Member of Clubs or Organizations: Not on file  . Attends Archivist Meetings: Not on file  . Marital Status: Not on file  Intimate Partner Violence:   . Fear of Current or Ex-Partner: Not on file  .  Emotionally Abused: Not on file  . Physically Abused: Not on file  . Sexually Abused: Not on file   Family History  Problem Relation Age of Onset  . Stroke Mother   . Cancer Father   . Breast cancer Sister 15  . Diabetes Brother     Objective: Office vital signs reviewed. BP 120/71   Pulse 69   Temp 97.6 F (36.4 C)   Ht $R'5\' 2"'Fs$  (1.575 m)   Wt 127 lb 3.2 oz (57.7 kg)   SpO2 95%   BMI 23.27 kg/m   Physical Examination:  General: Awake, alert, well nourished, well appearing female in no acute distress HEENT: Normal.  Moist mucous membranes.  Sclera white Pulm: No labored breathing.  No wheezes   Assessment/ Plan: 64 y.o. female   1. Factor V Leiden mutation ([place]) INR is therapeutic at 2.2 today.  She can follow-up in 6 to 8 weeks, sooner if needed - CoaguChek XS/INR Waived - Hemoglobin, fingerstick  2. Warfarin anticoagulation - CoaguChek XS/INR Waived - Hemoglobin, fingerstick   No orders of the defined types were placed in this encounter.  No orders of the defined types were placed in this encounter.    [name], DO [place] [phone]

## 2020-05-09 ENCOUNTER — Other Ambulatory Visit: Payer: Self-pay | Admitting: Family Medicine

## 2020-05-09 DIAGNOSIS — D6851 Activated protein C resistance: Secondary | ICD-10-CM

## 2020-05-16 ENCOUNTER — Ambulatory Visit: Payer: BC Managed Care – PPO | Admitting: Family Medicine

## 2020-05-31 ENCOUNTER — Other Ambulatory Visit: Payer: Self-pay

## 2020-05-31 ENCOUNTER — Encounter: Payer: Self-pay | Admitting: Family Medicine

## 2020-05-31 ENCOUNTER — Ambulatory Visit: Payer: BC Managed Care – PPO | Admitting: Family Medicine

## 2020-05-31 VITALS — BP 131/76 | HR 82 | Temp 97.0°F | Ht 62.0 in | Wt 127.0 lb

## 2020-05-31 DIAGNOSIS — Z7901 Long term (current) use of anticoagulants: Secondary | ICD-10-CM | POA: Diagnosis not present

## 2020-05-31 DIAGNOSIS — D6851 Activated protein C resistance: Secondary | ICD-10-CM | POA: Diagnosis not present

## 2020-05-31 LAB — COAGUCHEK XS/INR WAIVED
INR: 2.3 — ABNORMAL HIGH (ref 0.9–1.1)
Prothrombin Time: 27.7 s

## 2020-05-31 NOTE — Progress Notes (Signed)
Subjective: CC: INR check PCP: [name], DO HPI:[name] is a 65 y.o. female presenting to clinic today for:  1. INR check Goal 2-3 She is compliant with her Coumadin. She is currently taking 10 mg daily and continues to have greens in her diet a couple of times per week. No chest pain, shortness of breath, edema, hematochezia or melena. No missed doses.   ROS: Per HPI  Allergies  Allergen Reactions  . Codeine   . Donnatal [Belladonna Alk-Phenobarb Er]   . Librium    Past Medical History:  Diagnosis Date  . Anemia     Current Outpatient Medications:  .  diclofenac Sodium (VOLTAREN) 1 % GEL, APPLY 1 GRAM TO AFFECTED AREA TWICE DAILY AS NEEDED, Disp: 100 g, Rfl: 12 .  ESTRACE VAGINAL 0.1 MG/GM vaginal cream, Apply vaginally as directed, Disp: , Rfl:  .  fluticasone (FLONASE) 50 MCG/ACT nasal spray, Place 2 sprays into the nose daily. Reported on 05/13/2015, Disp: , Rfl:  .  hydrochlorothiazide (MICROZIDE) 12.5 MG capsule, TAKE ONE (1) CAPSULE EACH DAY, Disp: 90 capsule, Rfl: 0 .  ibandronate (BONIVA) 150 MG tablet, Take 150 mg by mouth every 30 (thirty) days. Take in the morning with a full glass of water, on an empty stomach, and do not take anything else by mouth or lie down for the next 30 min., Disp: , Rfl:  .  Ketoprofen 10 % CREA, Apply to area of pain 1 to 2 times per day., Disp: 60 g, Rfl: 0 .  lisinopril (ZESTRIL) 5 MG tablet, Take 5 mg by mouth daily., Disp: , Rfl:  .  loratadine (CLARITIN) 10 MG tablet, Take 1 tablet (10 mg total) by mouth daily., Disp: 30 tablet, Rfl: 1 .  potassium chloride SA (KLOR-CON) 20 MEQ tablet, Take 1 tablet (20 mEq total) by mouth 2 (two) times daily., Disp: 8 tablet, Rfl: 0 .  warfarin (COUMADIN) 5 MG tablet, TAKE $RemoveBefo'15MG'dehCBSnphzu$  ON MONDAYS, WEDNESDAYS, AND FRIDAYS & TAKE $Remo'10MG'sFaom$  ALL OTHER DAYS, Disp: 180 tablet, Rfl: 0 Social History   Socioeconomic History  . Marital status: Married    Spouse name: Not on file  . Number of children:  Not on file  . Years of education: Not on file  . Highest education level: Not on file  Occupational History  . Not on file  Tobacco Use  . Smoking status: Never Smoker  . Smokeless tobacco: Never Used  Vaping Use  . Vaping Use: Never used  Substance and Sexual Activity  . Alcohol use: Not Currently    Alcohol/week: 0.0 standard drinks  . Drug use: Not Currently  . Sexual activity: Not on file  Other Topics Concern  . Not on file  Social History Narrative  . Not on file   Social Determinants of Health   Financial Resource Strain: Not on file  Food Insecurity: Not on file  Transportation Needs: Not on file  Physical Activity: Not on file  Stress: Not on file  Social Connections: Not on file  Intimate Partner Violence: Not on file   Family History  Problem Relation Age of Onset  . Stroke Mother   . Cancer Father   . Breast cancer Sister 12  . Diabetes Brother     Objective: Office vital signs reviewed. BP 131/76   Pulse 82   Temp (!) 97 F (36.1 C) (Temporal)   Ht $R'5\' 2"'iF$  (1.575 m)   Wt 127 lb (57.6 kg)   SpO2 98%  BMI 23.23 kg/m   Physical Examination:  General: Awake, alert, well nourished, No acute distress HEENT: Normal, sclera white Cardio: regular rate and rhythm, S1S2 heard, no murmurs appreciated Pulm: clear to auscultation bilaterally, no wheezes, rhonchi or rales; normal work of breathing on room air Extremities: warm, well perfused, No edema, cyanosis or clubbing; +2 pulses bilaterally  Assessment/ Plan: 65 y.o. female   Factor V Leiden mutation ([place]) - Plan: CoaguChek XS/INR Waived  Warfarin anticoagulation  INR is therapeutic today. Continue current regimen. Follow-up in 6 to 8 weeks, sooner prn  No orders of the defined types were placed in this encounter.  No orders of the defined types were placed in this encounter.    [name], DO [place] [phone]

## 2020-06-05 ENCOUNTER — Other Ambulatory Visit: Payer: Self-pay | Admitting: Family Medicine

## 2020-06-16 DIAGNOSIS — L728 Other follicular cysts of the skin and subcutaneous tissue: Secondary | ICD-10-CM | POA: Diagnosis not present

## 2020-06-16 DIAGNOSIS — L570 Actinic keratosis: Secondary | ICD-10-CM | POA: Diagnosis not present

## 2020-06-16 DIAGNOSIS — T148XXA Other injury of unspecified body region, initial encounter: Secondary | ICD-10-CM | POA: Diagnosis not present

## 2020-06-16 DIAGNOSIS — X32XXXA Exposure to sunlight, initial encounter: Secondary | ICD-10-CM | POA: Diagnosis not present

## 2020-06-16 DIAGNOSIS — L820 Inflamed seborrheic keratosis: Secondary | ICD-10-CM | POA: Diagnosis not present

## 2020-06-17 ENCOUNTER — Telehealth: Payer: Self-pay

## 2020-06-17 ENCOUNTER — Telehealth: Payer: Self-pay | Admitting: *Deleted

## 2020-06-17 ENCOUNTER — Other Ambulatory Visit: Payer: Self-pay | Admitting: Family Medicine

## 2020-06-17 MED ORDER — KETOPROFEN 10 % EX CREA: TOPICAL_CREAM | CUTANEOUS | 1 refills | Status: DC

## 2020-06-17 NOTE — Telephone Encounter (Signed)
Patient aware.

## 2020-06-17 NOTE — Telephone Encounter (Signed)
Sent.  Please dc Voltaren gel. Cannot use both

## 2020-06-17 NOTE — Telephone Encounter (Signed)
Refill corrected and sent to [facility]

## 2020-06-17 NOTE — Telephone Encounter (Signed)
Fax from [facility] pharmacy RF request for Ketoprofen 10% plo gel apply to area of pain 1-2x per day Last OV 05/31/20 Last Rf 08/19/19 Next OV 07/29/20

## 2020-06-21 ENCOUNTER — Telehealth: Payer: Self-pay

## 2020-06-21 NOTE — Telephone Encounter (Signed)
Verbal order given to [facility], they did not receive this electronically on the 28th.  Pt aware

## 2020-07-01 ENCOUNTER — Other Ambulatory Visit: Payer: Self-pay | Admitting: *Deleted

## 2020-07-01 MED ORDER — KETOPROFEN 10 % EX CREA: TOPICAL_CREAM | CUTANEOUS | 99 refills | Status: DC

## 2020-07-29 ENCOUNTER — Other Ambulatory Visit: Payer: Self-pay

## 2020-07-29 ENCOUNTER — Encounter: Payer: Self-pay | Admitting: Family Medicine

## 2020-07-29 ENCOUNTER — Ambulatory Visit: Payer: BC Managed Care – PPO | Admitting: Family Medicine

## 2020-07-29 VITALS — BP 112/67 | HR 73 | Temp 98.1°F | Ht 62.0 in | Wt 128.0 lb

## 2020-07-29 DIAGNOSIS — G5761 Lesion of plantar nerve, right lower limb: Secondary | ICD-10-CM | POA: Diagnosis not present

## 2020-07-29 DIAGNOSIS — Z7901 Long term (current) use of anticoagulants: Secondary | ICD-10-CM | POA: Diagnosis not present

## 2020-07-29 DIAGNOSIS — R791 Abnormal coagulation profile: Secondary | ICD-10-CM | POA: Diagnosis not present

## 2020-07-29 DIAGNOSIS — D6851 Activated protein C resistance: Secondary | ICD-10-CM

## 2020-07-29 LAB — COAGUCHEK XS/INR WAIVED
INR: 1.3 — ABNORMAL HIGH (ref 0.9–1.1)
Prothrombin Time: 15.9 s

## 2020-07-29 LAB — POCT INR: INR: 1.3 — AB (ref 2–3)

## 2020-07-29 MED ORDER — METHYLPREDNISOLONE ACETATE 40 MG/ML IJ SUSP
40.0000 mg | Freq: Once | INTRAMUSCULAR | Status: AC
Start: 2020-07-29 — End: 2020-07-29
  Administered 2020-07-29: 40 mg via INTRAMUSCULAR

## 2020-07-29 NOTE — Patient Instructions (Signed)
Metatarsal pad (medium would probably work well)   [name] Neuralgia  Morton neuralgia is foot pain that affects the ball of the foot and the area near the toes. Morton neuralgia occurs when part of a nerve in the foot (digital nerve) is under too much pressure (compressed). When this happens over a long period of time, the nerve can thicken (neuroma) and cause pain. Pain usually occurs between the third and fourth toes.  Morton neuralgia can come and go but may get worse over time. What are the causes? This condition is caused by doing the same things over and over with your foot, such as:  Activities such as running or jumping.  Wearing shoes that are too tight. What increases the risk? You may be at higher risk for Morton neuralgia if you:  Are female.  Wear high heels.  Wear shoes that are narrow or tight.  Do activities that repeatedly stretch your toes, such as: ? Running. ? Ballet. ? Long-distance walking. What are the signs or symptoms? The first symptom of Morton neuralgia is pain that spreads from the ball of the foot to the toes. It may feel like you are walking on a marble. Pain usually gets worse with walking and goes away at night. Other symptoms may include numbness and cramping of your toes. Both feet are equally affected, but rarely at the same time. How is this diagnosed? This condition is diagnosed based on your symptoms, your medical history, and a physical exam. Your health care provider may:  Squeeze your foot just behind your toe.  Ask you to move your toes to check for pain.  Ask about your physical activity level. You also may have imaging tests, such as an X-ray, ultrasound, or MRI. How is this treated? Treatment depends on how severe your condition is and what causes it. Treatment may involve:  Wearing different shoes that are not too tight, are low-heeled, and provide good support. For some people, this is the only treatment needed.  Wearing an  over-the-counter or custom supportive pad (orthotic) under the front of your foot.  Getting injections of numbing medicine and anti-inflammatory medicine (steroid) in the nerve.  Having surgery to remove part of the thickened nerve. Follow these instructions at home: Managing pain, stiffness, and swelling  Massage your foot as needed.  Wear orthotics as told by your health care provider.  If directed, put ice on your foot: ? Put ice in a plastic bag. ? Place a towel between your skin and the bag. ? Leave the ice on for 20 minutes, 2-3 times a day.  Avoid activities that cause pain or make pain worse. If you play sports, ask your health care provider when it is safe for you to return to sports.  Raise (elevate) your foot above the level of your heart while lying down and, when possible, while sitting.   General instructions  Take over-the-counter and prescription medicines only as told by your health care provider.  Do not drive or use heavy machinery while taking prescription pain medicine.  Wear shoes that: ? Have soft soles. ? Have a wide toe area. ? Provide arch support. ? Do not pinch or squeeze your feet. ? Have room for your orthotics, if applicable.  Keep all follow-up visits as told by your health care provider. This is important. Contact a health care provider if:  Your symptoms get worse or do not get better with treatment and home care. Summary  Morton neuralgia is foot  pain that affects the ball of the foot and the area near the toes. Pain usually occurs between the third and fourth toes, gets worse with walking, and goes away at night.  Morton neuralgia occurs when part of a nerve in the foot (digital nerve) is under too much pressure. When this happens over a long period of time, the nerve can thicken (neuroma) and cause pain.  This condition is caused by doing the same things over and over with your foot, such as running or jumping, wearing shoes that are too  tight, or wearing high heels.  Treatment may involve wearing low-heeled shoes that are not too tight, wearing a supportive pad (orthotic) under the front of your foot, getting injections in the nerve, or having surgery to remove part of the thickened nerve. This information is not intended to replace advice given to you by your health care provider. Make sure you discuss any questions you have with your health care provider. Document Revised: 05/21/2017 Document Reviewed: 05/21/2017 Elsevier Patient Education  2021 [employer].

## 2020-07-29 NOTE — Progress Notes (Signed)
 Subjective: CC: INR check PCP: Gottschalk, Ashly M, DO HPI:[name] is a 64 y.o. female presenting to clinic today for:  1.  Factor V Leyden mutation Goal INR 2-3 Patient is compliant with 10 mg of Coumadin daily.  Last INR was therapeutic.  She admits that she ate some current greens but it was not quite a lot on Tuesday.  She is otherwise not change any of her diet.  No reports of chest pain or shortness of breath  2.  Foot pain Patient reports foot pain at the base of the second and third MTPs.  She was treated with a corticosteroid injection for capsulitis at the base the second several months ago by podiatry.  She notes a flare with some associated swelling yesterday but it seems to be getting better.  She still has some pain with ambulation and will be going out of town soon and is wondering if we can do anything to help her.  Has applied topical Voltaren gel thus far   ROS: Per HPI  Allergies  Allergen Reactions  . Codeine   . Donnatal [Belladonna Alk-Phenobarb Er]   . Librium    Past Medical History:  Diagnosis Date  . Anemia     Current Outpatient Medications:  .  ESTRACE VAGINAL 0.1 MG/GM vaginal cream, Apply vaginally as directed, Disp: , Rfl:  .  fluticasone (FLONASE) 50 MCG/ACT nasal spray, Place 2 sprays into the nose daily. Reported on 05/13/2015, Disp: , Rfl:  .  hydrochlorothiazide (MICROZIDE) 12.5 MG capsule, TAKE ONE (1) CAPSULE EACH DAY, Disp: 90 capsule, Rfl: 1 .  ibandronate (BONIVA) 150 MG tablet, Take 150 mg by mouth every 30 (thirty) days. Take in the morning with a full glass of water, on an empty stomach, and do not take anything else by mouth or lie down for the next 30 min., Disp: , Rfl:  .  Ketoprofen 10 % CREA, Apply to affected areas 1-2 times daily as needed, Disp: 60 g, Rfl: prn .  lisinopril (ZESTRIL) 5 MG tablet, Take 5 mg by mouth daily., Disp: , Rfl:  .  loratadine (CLARITIN) 10 MG tablet, Take 1 tablet (10 mg total) by mouth daily.,  Disp: 30 tablet, Rfl: 1 .  potassium chloride SA (KLOR-CON) 20 MEQ tablet, Take 1 tablet (20 mEq total) by mouth 2 (two) times daily., Disp: 8 tablet, Rfl: 0 .  warfarin (COUMADIN) 5 MG tablet, TAKE 15MG ON MONDAYS, WEDNESDAYS, AND FRIDAYS & TAKE 10MG ALL OTHER DAYS, Disp: 180 tablet, Rfl: 0 Social History   Socioeconomic History  . Marital status: Married    Spouse name: Not on file  . Number of children: Not on file  . Years of education: Not on file  . Highest education level: Not on file  Occupational History  . Not on file  Tobacco Use  . Smoking status: Never Smoker  . Smokeless tobacco: Never Used  Vaping Use  . Vaping Use: Never used  Substance and Sexual Activity  . Alcohol use: Not Currently    Alcohol/week: 0.0 standard drinks  . Drug use: Not Currently  . Sexual activity: Not on file  Other Topics Concern  . Not on file  Social History Narrative  . Not on file   Social Determinants of Health   Financial Resource Strain: Not on file  Food Insecurity: Not on file  Transportation Needs: Not on file  Physical Activity: Not on file  Stress: Not on file  Social Connections: Not   on file  Intimate Partner Violence: Not on file   Family History  Problem Relation Age of Onset  . Stroke Mother   . Cancer Father   . Breast cancer Sister 75  . Diabetes Brother     Objective: Office vital signs reviewed. BP 112/67   Pulse 73   Temp 98.1 F (36.7 C) (Temporal)   Ht 5' 2" (1.575 m)   Wt 128 lb (58.1 kg)   SpO2 95%   BMI 23.41 kg/m   Physical Examination:  General: Awake, alert, well nourished, No acute distress Cardio: regular rate and rhythm, S1S2 heard, no murmurs appreciated Extremities: warm, well perfused, No edema, cyanosis or clubbing; +2 pulses bilaterally MSK: Ambulating independently.  There are no palpable bony defects in the affected area on the right foot.  She does have tenderness that is reproducible however  Assessment/ Plan: 65 y.o. female    Subtherapeutic international normalized ratio (INR)  Factor V Leiden mutation ([place]) - Plan: CoaguChek XS/INR Waived, POCT INR  Warfarin anticoagulation - Plan: POCT INR  Morton's neuralgia, right - Plan: methylPREDNISolone acetate (DEPO-MEDROL) injection 40 mg, POCT INR  INR subtherapeutic.  Increase to 15 mg today then resume 10 mg daily all other days.  Recheck in 2 weeks.  I suspect this is a Morton's neuralgia.  Discussed metatarsal pad.  Corticosteroid injection administered intramuscularly today.  Okay to continue topical Voltaren if useful  No orders of the defined types were placed in this encounter.  No orders of the defined types were placed in this encounter.    [name], DO [place] [phone]

## 2020-08-01 ENCOUNTER — Other Ambulatory Visit: Payer: Self-pay | Admitting: Family Medicine

## 2020-08-01 DIAGNOSIS — D6851 Activated protein C resistance: Secondary | ICD-10-CM

## 2020-08-08 ENCOUNTER — Other Ambulatory Visit: Payer: Self-pay

## 2020-08-08 ENCOUNTER — Ambulatory Visit (INDEPENDENT_AMBULATORY_CARE_PROVIDER_SITE_OTHER): Payer: BC Managed Care – PPO

## 2020-08-08 ENCOUNTER — Ambulatory Visit: Payer: BC Managed Care – PPO | Admitting: Podiatry

## 2020-08-08 DIAGNOSIS — M7751 Other enthesopathy of right foot: Secondary | ICD-10-CM

## 2020-08-08 DIAGNOSIS — M2060 Acquired deformities of toe(s), unspecified, unspecified foot: Secondary | ICD-10-CM | POA: Diagnosis not present

## 2020-08-08 MED ORDER — BETAMETHASONE SOD PHOS & ACET 6 (3-3) MG/ML IJ SUSP
12.0000 mg | Freq: Once | INTRAMUSCULAR | Status: AC
  Administered 2020-08-08: 12 mg via INTRAMUSCULAR

## 2020-08-10 NOTE — Progress Notes (Signed)
Subjective: 65 year old female presents the office today for concerns of pain to her right foot.  She previously was seen and she had an injection of the second MPJ which did well but her pain is now moved to the third.  She denies recent injury or trauma.  No swelling that she reports.  No numbness or tingling or any radiating pain.  She has no other concerns today. Denies any systemic complaints such as fevers, chills, nausea, vomiting. No acute changes since last appointment, and no other complaints at this time.   Objective: AAO x3, NAD DP/PT pulses palpable bilaterally, CRT less than 3 seconds There is medial deviation present lesser digits.  The majority tenderness is localized to the third MPJ and there is no edema, erythema.  There is no fluctuation crepitation but there is no palpable neuroma identified.  MMT 5/5.   No pain with calf compression, swelling, warmth, erythema  Assessment: Capsulitis right third MPJ  Plan: -All treatment options discussed with the patient including all alternatives, risks, complications.  -X-rays obtained and reviewed.  There is no evidence of acute fracture.  Medial deviation of the toes at the level MPJ. -Injection of 0.5 mL of Cel[place] Soluspan was injected into the right second MPJ.  The skin was prepped with Betadine prior to this and she tolerated the procedure well and complications.  Postinjection care discussed. -Discussed measures to help prevent reoccurrence.  Dispensed metatarsal pads.  Discussed custom orthotics as well. -Patient encouraged to call the office with any questions, concerns, change in symptoms.   Vivi Barrack DPM

## 2020-08-12 ENCOUNTER — Encounter: Payer: Self-pay | Admitting: Family Medicine

## 2020-08-12 ENCOUNTER — Ambulatory Visit: Payer: BC Managed Care – PPO | Admitting: Family Medicine

## 2020-08-12 ENCOUNTER — Other Ambulatory Visit: Payer: Self-pay

## 2020-08-12 VITALS — BP 116/71 | HR 73 | Temp 97.3°F | Wt 128.0 lb

## 2020-08-12 DIAGNOSIS — R791 Abnormal coagulation profile: Secondary | ICD-10-CM | POA: Diagnosis not present

## 2020-08-12 DIAGNOSIS — D6851 Activated protein C resistance: Secondary | ICD-10-CM | POA: Diagnosis not present

## 2020-08-12 DIAGNOSIS — Z7901 Long term (current) use of anticoagulants: Secondary | ICD-10-CM | POA: Diagnosis not present

## 2020-08-12 LAB — COAGUCHEK XS/INR WAIVED
INR: 1.7 — ABNORMAL HIGH (ref 0.9–1.1)
Prothrombin Time: 20.8 s

## 2020-08-12 LAB — POCT INR: INR: 1.7 — AB (ref 2–3)

## 2020-08-12 MED ORDER — WARFARIN SODIUM 5 MG PO TABS: ORAL_TABLET | ORAL | 0 refills | Status: DC

## 2020-08-12 NOTE — Progress Notes (Signed)
° °Subjective: °CC: subtherapeutic INR °PCP: Gottschalk, Ashly M, DO °HPI:[name] is a 65 y.o. female presenting to clinic today for: ° °1.  Subtherapeutic INR °Goal INR is 2-3.  At last visit her INR was subtherapeutic at 1.3.  She was instructed to increase her Coumadin to 15 mg that day then resume 10 mg daily dosing.  She is here for interval follow-up.  She has not really incorporated and is finishing her diet.  She is compliant with regimen.  No changes in medications otherwise.  No hematochezia or melena.  No lower extremity swelling ° ° °ROS: Per HPI ° °Allergies  °Allergen Reactions  °• Codeine   °• Donnatal [Belladonna Alk-Phenobarb Er]   °• Librium   ° °Past Medical History:  °Diagnosis Date  °• Anemia   ° ° °Current Outpatient Medications:  °•  ESTRACE VAGINAL 0.1 MG/GM vaginal cream, Apply vaginally as directed, Disp: , Rfl:  °•  fluticasone (FLONASE) 50 MCG/ACT nasal spray, Place 2 sprays into the nose daily. Reported on 05/13/2015, Disp: , Rfl:  °•  hydrochlorothiazide (MICROZIDE) 12.5 MG capsule, TAKE ONE (1) CAPSULE EACH DAY, Disp: 90 capsule, Rfl: 1 °•  ibandronate (BONIVA) 150 MG tablet, Take 150 mg by mouth every 30 (thirty) days. Take in the morning with a full glass of water, on an empty stomach, and do not take anything else by mouth or lie down for the next 30 min., Disp: , Rfl:  °•  Ketoprofen 10 % CREA, Apply to affected areas 1-2 times daily as needed, Disp: 60 g, Rfl: prn °•  lisinopril (ZESTRIL) 5 MG tablet, Take 5 mg by mouth daily., Disp: , Rfl:  °•  loratadine (CLARITIN) 10 MG tablet, Take 1 tablet (10 mg total) by mouth daily., Disp: 30 tablet, Rfl: 1 °•  potassium chloride SA (KLOR-CON) 20 MEQ tablet, Take 1 tablet (20 mEq total) by mouth 2 (two) times daily., Disp: 8 tablet, Rfl: 0 °•  warfarin (COUMADIN) 5 MG tablet, TAKE 15MG ON MONDAYS, WEDNESDAYS, AND FRIDAYS & TAKE 10MG ALL OTHER DAYS, Disp: 180 tablet, Rfl: 0 °Social History  ° °Socioeconomic History  °• Marital  status: Married  °  Spouse name: Not on file  °• Number of children: Not on file  °• Years of education: Not on file  °• Highest education level: Not on file  °Occupational History  °• Not on file  °Tobacco Use  °• Smoking status: Never Smoker  °• Smokeless tobacco: Never Used  °Vaping Use  °• Vaping Use: Never used  °Substance and Sexual Activity  °• Alcohol use: Not Currently  °  Alcohol/week: 0.0 standard drinks  °• Drug use: Not Currently  °• Sexual activity: Not on file  °Other Topics Concern  °• Not on file  °Social History Narrative  °• Not on file  ° °Social Determinants of Health  ° °Financial Resource Strain: Not on file  °Food Insecurity: Not on file  °Transportation Needs: Not on file  °Physical Activity: Not on file  °Stress: Not on file  °Social Connections: Not on file  °Intimate Partner Violence: Not on file  ° °Family History  °Problem Relation Age of Onset  °• Stroke Mother   °• Cancer Father   °• Breast cancer Sister 42  °• Diabetes Brother   ° ° °Objective: °Office vital signs reviewed. °BP 116/71    Pulse 73    Temp (!) 97.3 °F (36.3 °C) (Temporal)    Wt 128 lb (58.1 kg)      BMI 23.41 kg/m   Physical Examination:  General: Awake, alert, well nourished, No acute distress Cardio: regular rate and rhythm, S1S2 heard, no murmurs appreciated Pulm: clear to auscultation bilaterally, no wheezes, rhonchi or rales; normal work of breathing on room air Extremities: No tenderness to palpation to the calves.  Calves are equal in size   Assessment/ Plan: 65 y.o. female   Subtherapeutic international normalized ratio (INR) - Plan: POCT INR  Factor V Leiden mutation ([place]) - Plan: CoaguChek XS/INR Waived, POCT INR, warfarin (COUMADIN) 5 MG tablet  Warfarin anticoagulation - Plan: CoaguChek XS/INR Waived, POCT INR  INR subtherapeutic at 1.7 today.  Increase dose to 15 mg 2 days/week with 10 mg daily all other days.  Follow-up in 2 weeks.  Appointment has been scheduled  No orders of the  defined types were placed in this encounter.  No orders of the defined types were placed in this encounter.    [name], DO [place] [phone]

## 2020-08-26 ENCOUNTER — Encounter: Payer: Self-pay | Admitting: Family Medicine

## 2020-08-26 ENCOUNTER — Other Ambulatory Visit: Payer: Self-pay

## 2020-08-26 ENCOUNTER — Telehealth: Payer: Self-pay

## 2020-08-26 ENCOUNTER — Ambulatory Visit: Payer: BC Managed Care – PPO | Admitting: Family Medicine

## 2020-08-26 VITALS — BP 118/80 | HR 70 | Temp 97.4°F | Ht 62.0 in | Wt 128.4 lb

## 2020-08-26 DIAGNOSIS — Z7901 Long term (current) use of anticoagulants: Secondary | ICD-10-CM | POA: Diagnosis not present

## 2020-08-26 DIAGNOSIS — D6851 Activated protein C resistance: Secondary | ICD-10-CM

## 2020-08-26 LAB — COAGUCHEK XS/INR WAIVED
INR: 3.1 — ABNORMAL HIGH (ref 0.9–1.1)
Prothrombin Time: 37.6 s

## 2020-08-26 NOTE — Progress Notes (Signed)
Subjective: CC: Factor V Leyden mutation PCP: [name], DO HPI:[name] is a 65 y.o. female presenting to clinic today for:  1.  Factor V Leiden mutation Goal INR 2-3 INR was subtherapeutic to 1.7 last visit.  She is instructed to increase her Coumadin to 15 mg 2 days/week with 10 mg daily all other days.  She is here for interval follow-up; she has been compliant with this regimen.  No missed doses.  She is incorporating spinach into her diet.   ROS: Per HPI  Allergies  Allergen Reactions  . Codeine   . Donnatal [Belladonna Alk-Phenobarb Er]   . Librium    Past Medical History:  Diagnosis Date  . Anemia     Current Outpatient Medications:  .  ESTRACE VAGINAL 0.1 MG/GM vaginal cream, Apply vaginally as directed, Disp: , Rfl:  .  fluticasone (FLONASE) 50 MCG/ACT nasal spray, Place 2 sprays into the nose daily. Reported on 05/13/2015, Disp: , Rfl:  .  hydrochlorothiazide (MICROZIDE) 12.5 MG capsule, TAKE ONE (1) CAPSULE EACH DAY, Disp: 90 capsule, Rfl: 1 .  ibandronate (BONIVA) 150 MG tablet, Take 150 mg by mouth every 30 (thirty) days. Take in the morning with a full glass of water, on an empty stomach, and do not take anything else by mouth or lie down for the next 30 min., Disp: , Rfl:  .  Ketoprofen 10 % CREA, Apply to affected areas 1-2 times daily as needed, Disp: 60 g, Rfl: prn .  lisinopril (ZESTRIL) 5 MG tablet, Take 5 mg by mouth daily., Disp: , Rfl:  .  loratadine (CLARITIN) 10 MG tablet, Take 1 tablet (10 mg total) by mouth daily., Disp: 30 tablet, Rfl: 1 .  potassium chloride SA (KLOR-CON) 20 MEQ tablet, Take 1 tablet (20 mEq total) by mouth 2 (two) times daily., Disp: 8 tablet, Rfl: 0 .  warfarin (COUMADIN) 5 MG tablet, 78m Tues & Thursday and 130mdaily all other days, Disp: 180 tablet, Rfl: 0 Social History   Socioeconomic History  . Marital status: Married    Spouse name: Not on file  . Number of children: Not on file  . Years of education:  Not on file  . Highest education level: Not on file  Occupational History  . Not on file  Tobacco Use  . Smoking status: Never Smoker  . Smokeless tobacco: Never Used  Vaping Use  . Vaping Use: Never used  Substance and Sexual Activity  . Alcohol use: Not Currently    Alcohol/week: 0.0 standard drinks  . Drug use: Not Currently  . Sexual activity: Not on file  Other Topics Concern  . Not on file  Social History Narrative  . Not on file   Social Determinants of Health   Financial Resource Strain: Not on file  Food Insecurity: Not on file  Transportation Needs: Not on file  Physical Activity: Not on file  Stress: Not on file  Social Connections: Not on file  Intimate Partner Violence: Not on file   Family History  Problem Relation Age of Onset  . Stroke Mother   . Cancer Father   . Breast cancer Sister [id number]. Diabetes Brother     Objective: Office vital signs reviewed. BP 118/80   Pulse 70   Temp (!) 97.4 F (36.3 C) (Temporal)   Ht _0  (1.575 m)   Wt 128 lb 6.4 oz (58.2 kg)   SpO2 96%   BMI 23.48 kg/m  Physical Examination:  General: Awake, alert, well nourished, No acute distress Pulmonary: Normal work of breathing on room air  Assessment/ Plan: 65 y.o. female   Factor V Leiden mutation ([place]) - Plan: CoaguChek XS/INR Waived  Warfarin anticoagulation - Plan: CoaguChek XS/INR Waived  Only very mildly supratherapeutic at 3.1 today.  I encouraged her to continue incorporating vegetables into her diet.  She frequently uses spinach more often than twice per week.  May continue current regimen and follow-up in 8 weeks, sooner if concerns arise  No orders of the defined types were placed in this encounter.  No orders of the defined types were placed in this encounter.    [name], DO [place] [phone]

## 2020-09-13 IMAGING — DX DG LUMBAR SPINE 2-3V
3 series · 3 of 3 positions shown · non-contrast
Comparison: None

CLINICAL DATA: Coccygeal pain post fall

EXAM:
LUMBAR SPINE - 2-3 VIEW

[l-spine ap]
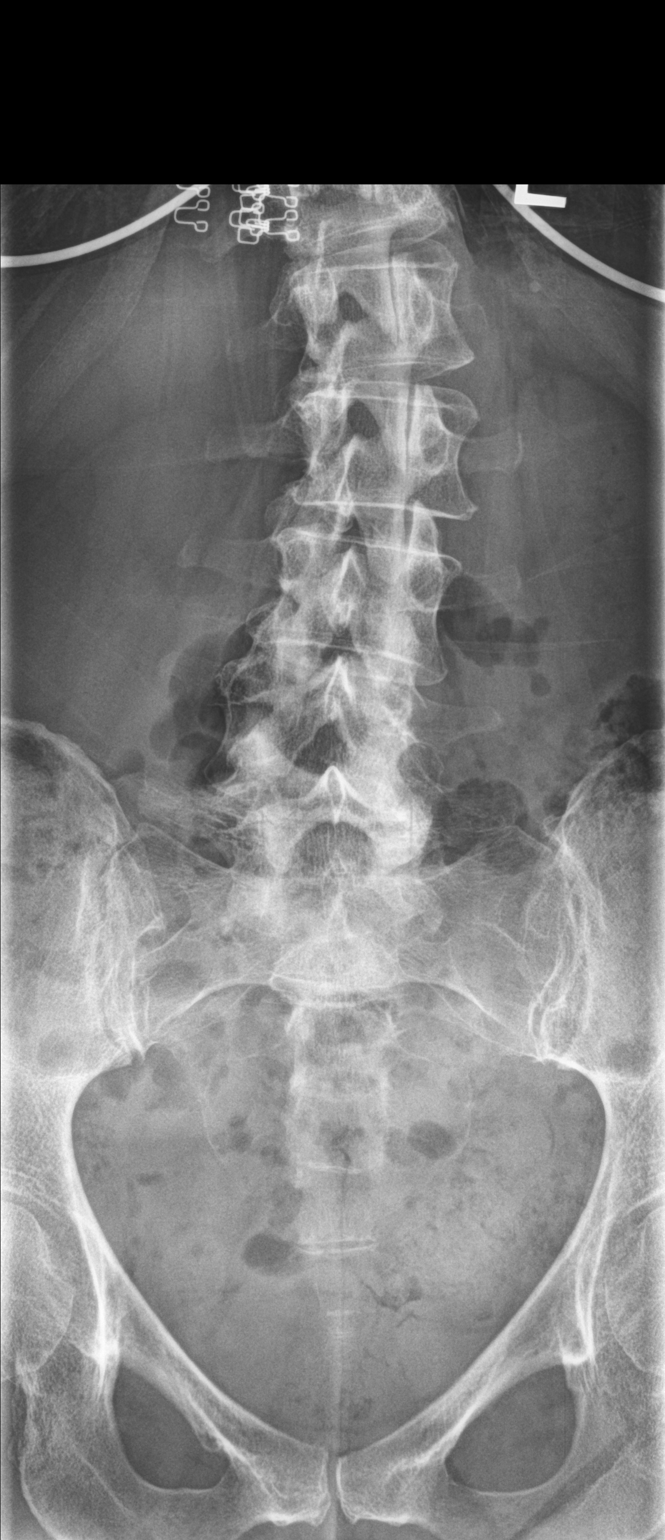

[l-spine lat (1 of 2)]
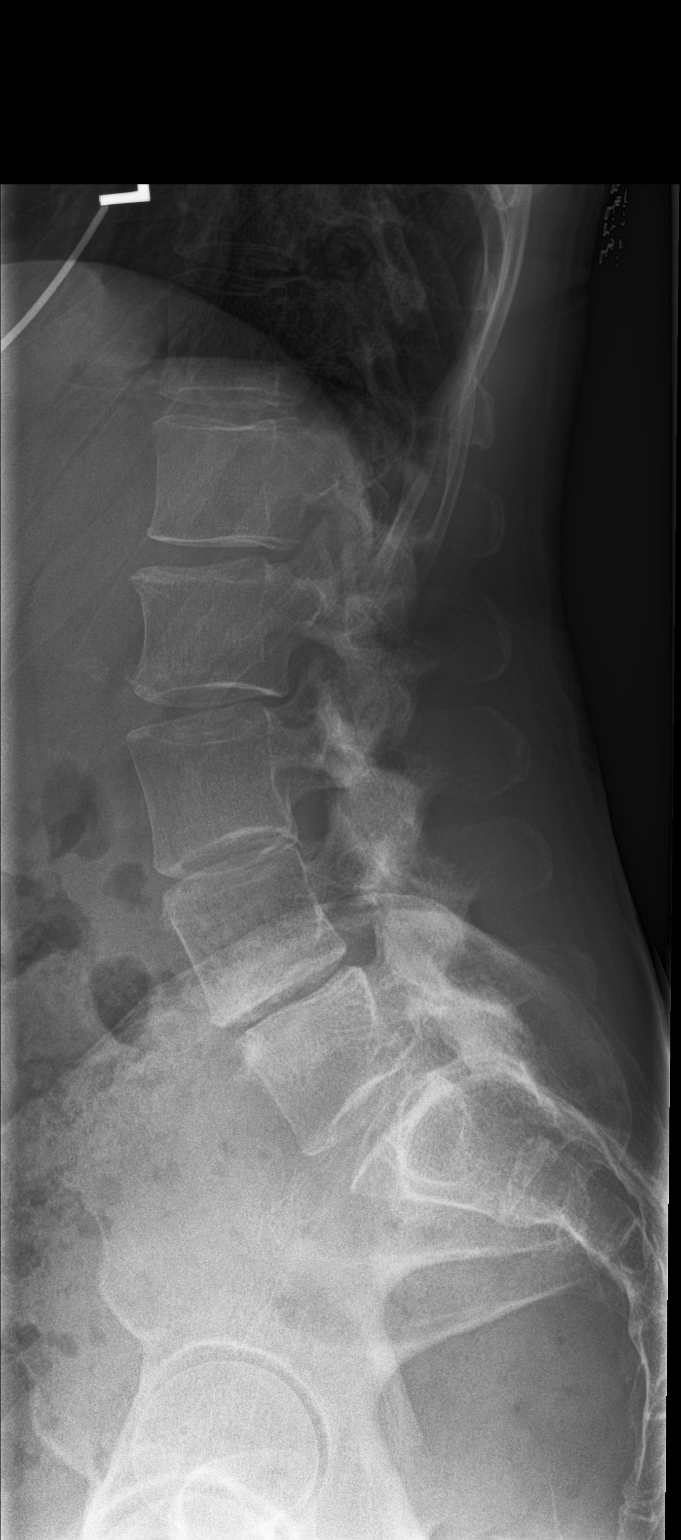

[l-spine lat (2 of 2)]
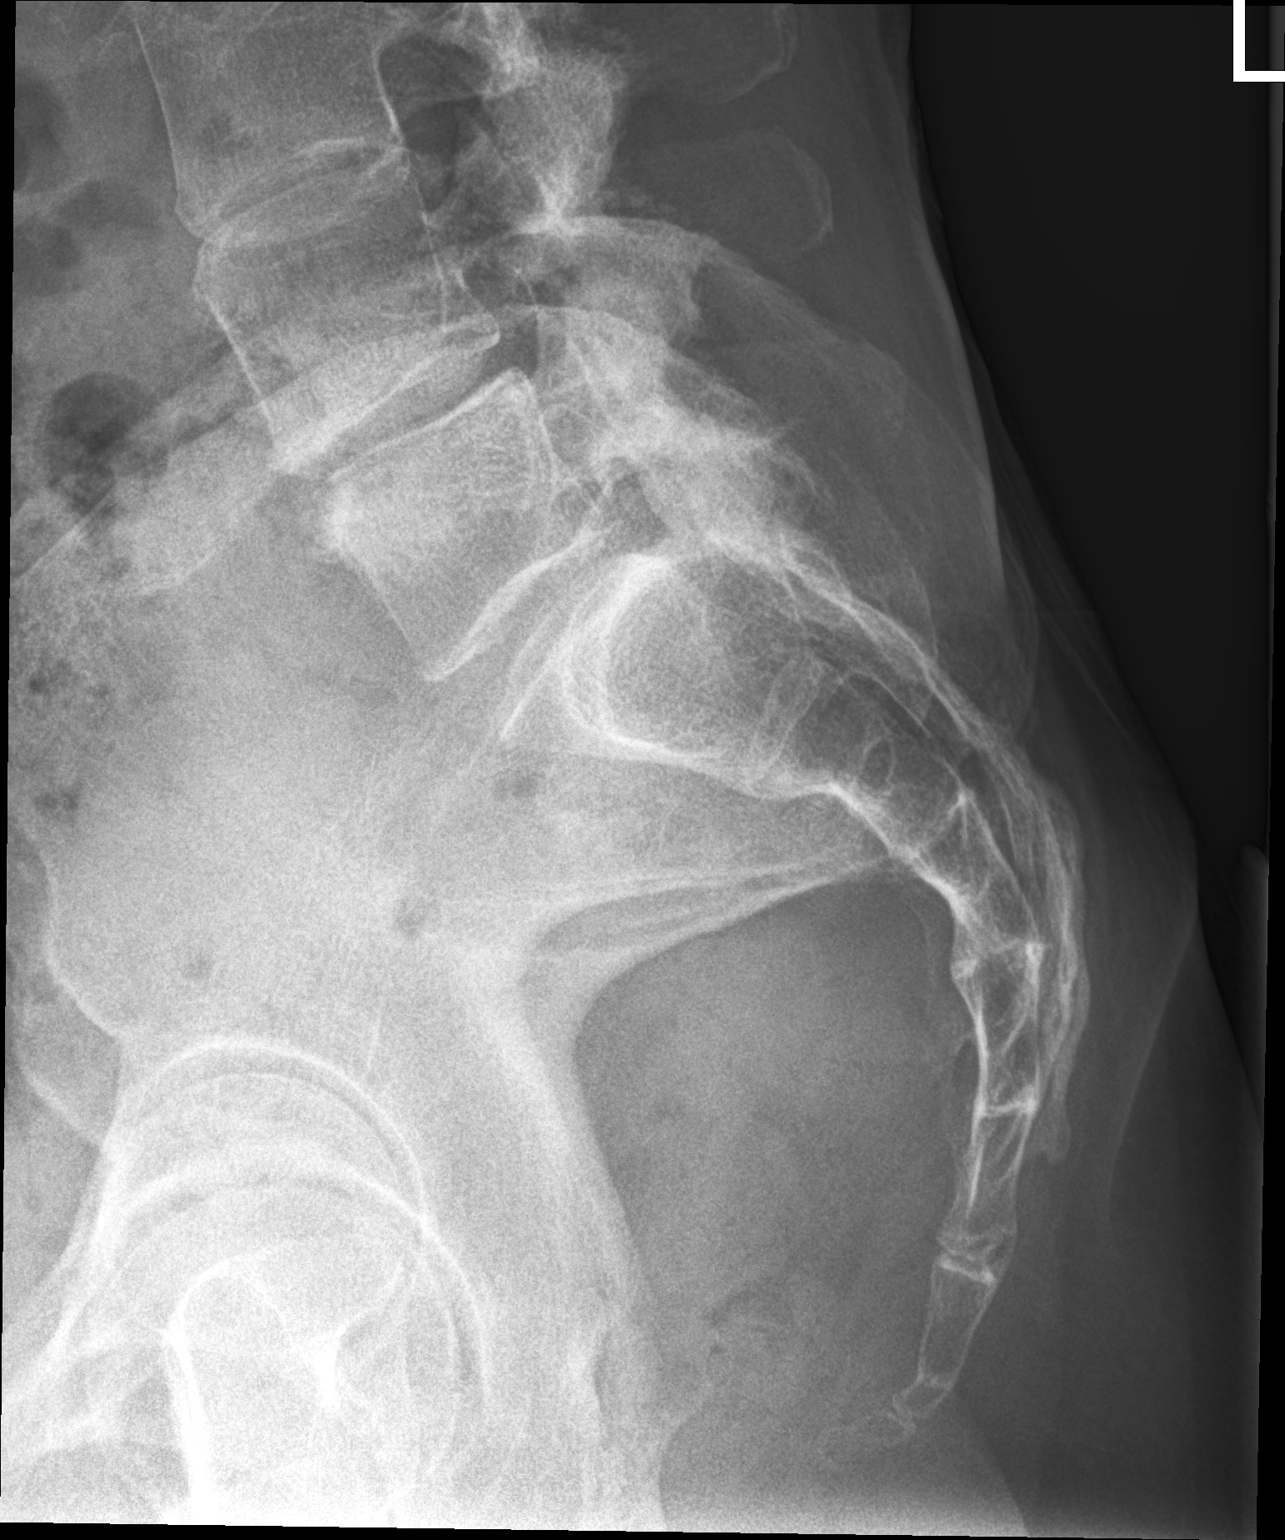

[3 of 3 positions shown; findings below may reference images not displayed]

FINDINGS: SI joints symmetric and preserved.

Sacral foramina symmetric.

No definite sacrococcygeal fracture identified.

Disc space narrowing at L3-L4 and L4-L5.

Levoconvex lumbar scoliosis.
IMPRESSION: No acute osseous abnormalities.

## 2020-10-27 ENCOUNTER — Encounter: Payer: Self-pay | Admitting: Family Medicine

## 2020-10-27 ENCOUNTER — Ambulatory Visit: Payer: BC Managed Care – PPO | Admitting: Family Medicine

## 2020-10-27 DIAGNOSIS — R300 Dysuria: Secondary | ICD-10-CM | POA: Diagnosis not present

## 2020-10-27 DIAGNOSIS — N3001 Acute cystitis with hematuria: Secondary | ICD-10-CM | POA: Diagnosis not present

## 2020-10-27 LAB — MICROSCOPIC EXAMINATION

## 2020-10-27 LAB — URINALYSIS, ROUTINE W REFLEX MICROSCOPIC
Bilirubin, UA: NEGATIVE
Glucose, UA: NEGATIVE
Ketones, UA: NEGATIVE
Nitrite, UA: POSITIVE — AB
Protein,UA: NEGATIVE
Specific Gravity, UA: 1.02 (ref 1.005–1.030)
Urobilinogen, Ur: 0.2 mg/dL (ref 0.2–1.0)
pH, UA: 7 (ref 5.0–7.5)

## 2020-10-27 MED ORDER — CEPHALEXIN 500 MG PO CAPS: 500.0000 mg | ORAL_CAPSULE | Freq: Two times a day (BID) | ORAL | 0 refills | Status: DC

## 2020-10-27 NOTE — Progress Notes (Signed)
   Virtual Visit  Note Due to COVID-19 pandemic this visit was conducted virtually. This visit type was conducted due to national recommendations for restrictions regarding the COVID-19 Pandemic (e.g. social distancing, sheltering in place) in an effort to limit this patient's exposure and mitigate transmission in our community. All issues noted in this document were discussed and addressed.  A physical exam was not performed with this format.  I connected with [name] on 10/27/20 at 1529 by telephone and verified that I am speaking with the correct person using two identifiers. [name] is currently located at at home and her husband is currently with her during the visit. The provider, Gabriel Earing, FNP is located in their office at time of visit.  I discussed the limitations, risks, security and privacy concerns of performing an evaluation and management service by telephone and the availability of in person appointments. I also discussed with the patient that there may be a patient responsible charge related to this service. The patient expressed understanding and agreed to proceed.  CC: UTI  History and Present Illness:  HPI [name] reports dysuria, frequency, and pressure for the last few hours. She denies fever, body aches chills, nausea, vomiting, flank pain or blood in urine. She has had a UTI before, but it has been years. She has dropped of urine for testing.    ROS As per HPI.   Observations/Objective: Alert and oriented x 3. Able to speak in full sentences without difficulty.   Urine dipstick shows positive for RBC's, positive for nitrates, and positive for leukocytes.  Micro exam: 11-30 WBC's per HPF, 6-11 RBC's per HPF, and many + bacteria.   Assessment and Plan: [name] was seen today for urinary tract infection.  Diagnoses and all orders for this visit:  Acute cystitis with hematuria Keflex as below. Culture pending. Return to office for new or worsening  symptoms, or if symptoms persist.  -     Urinalysis, Routine w reflex microscopic -     cephALEXin (KEFLEX) 500 MG capsule; Take 1 capsule (500 mg total) by mouth 2 (two) times daily. -     Urine Culture    Follow Up Instructions: As needed.     I discussed the assessment and treatment plan with the patient. The patient was provided an opportunity to ask questions and all were answered. The patient agreed with the plan and demonstrated an understanding of the instructions.   The patient was advised to call back or seek an in-person evaluation if the symptoms worsen or if the condition fails to improve as anticipated.  The above assessment and management plan was discussed with the patient. The patient verbalized understanding of and has agreed to the management plan. Patient is aware to call the clinic if symptoms persist or worsen. Patient is aware when to return to the clinic for a follow-up visit. Patient educated on when it is appropriate to go to the emergency department.   Time call ended:  1540  I provided 11 minutes of  non face-to-face time during this encounter.    Gabriel Earing, FNP

## 2020-11-02 LAB — URINE CULTURE

## 2020-11-04 ENCOUNTER — Ambulatory Visit: Payer: BC Managed Care – PPO | Admitting: Family Medicine

## 2020-11-04 ENCOUNTER — Other Ambulatory Visit: Payer: Self-pay

## 2020-11-04 ENCOUNTER — Encounter: Payer: Self-pay | Admitting: Family Medicine

## 2020-11-04 VITALS — BP 148/86 | HR 74 | Temp 97.4°F | Ht 62.0 in | Wt 126.8 lb

## 2020-11-04 DIAGNOSIS — Z7901 Long term (current) use of anticoagulants: Secondary | ICD-10-CM | POA: Diagnosis not present

## 2020-11-04 DIAGNOSIS — D6851 Activated protein C resistance: Secondary | ICD-10-CM | POA: Diagnosis not present

## 2020-11-04 LAB — COAGUCHEK XS/INR WAIVED
INR: 3.1 — ABNORMAL HIGH (ref 0.9–1.1)
Prothrombin Time: 37 s

## 2020-11-04 NOTE — Progress Notes (Signed)
Subjective: CC: INR check PCP: Emily Norlander, DO HPI:[name] is a 65 y.o. female presenting to clinic today for:  1. INR check Patient's INR was slightly supratherapeutic at 3.1.  She had not been eating as much greens as she had normally been.  She is here for interval follow-up.  She continues to take Coumadin 15 mg 2 days/week with 10 mg daily all other days.  Recently treated with Keflex for UTI.  She admits that she has not been eating greens for the last couple of days because she was afraid her INR would actually be too low.  She has been compliant with her medications.  No changes in medications or diet otherwise.  No reports of bleeding, chest pain or shortness of breath   ROS: Per HPI  Allergies  Allergen Reactions   Codeine    Donnatal [Belladonna Alk-Phenobarb Er]    Librium    Past Medical History:  Diagnosis Date   Anemia     Current Outpatient Medications:    cephALEXin (KEFLEX) 500 MG capsule, Take 1 capsule (500 mg total) by mouth 2 (two) times daily., Disp: 14 capsule, Rfl: 0   ESTRACE VAGINAL 0.1 MG/GM vaginal cream, Apply vaginally as directed, Disp: , Rfl:    fluticasone (FLONASE) 50 MCG/ACT nasal spray, Place 2 sprays into the nose daily. Reported on 05/13/2015, Disp: , Rfl:    hydrochlorothiazide (MICROZIDE) 12.5 MG capsule, TAKE ONE (1) CAPSULE EACH DAY, Disp: 90 capsule, Rfl: 1   ibandronate (BONIVA) 150 MG tablet, Take 150 mg by mouth every 30 (thirty) days. Take in the morning with a full glass of water, on an empty stomach, and do not take anything else by mouth or lie down for the next 30 min., Disp: , Rfl:    Ketoprofen 10 % CREA, Apply to affected areas 1-2 times daily as needed, Disp: 60 g, Rfl: prn   lisinopril (ZESTRIL) 5 MG tablet, Take 5 mg by mouth daily. (Patient not taking: Reported on 08/26/2020), Disp: , Rfl:    loratadine (CLARITIN) 10 MG tablet, Take 1 tablet (10 mg total) by mouth daily., Disp: 30 tablet, Rfl: 1   potassium  chloride SA (KLOR-CON) 20 MEQ tablet, Take 1 tablet (20 mEq total) by mouth 2 (two) times daily. (Patient not taking: Reported on 08/26/2020), Disp: 8 tablet, Rfl: 0   warfarin (COUMADIN) 5 MG tablet, 58m Tues & Thursday and 152mdaily all other days, Disp: 180 tablet, Rfl: 0 Social History   Socioeconomic History   Marital status: Married    Spouse name: Not on file   Number of children: Not on file   Years of education: Not on file   Highest education level: Not on file  Occupational History   Not on file  Tobacco Use   Smoking status: Never   Smokeless tobacco: Never  Vaping Use   Vaping Use: Never used  Substance and Sexual Activity   Alcohol use: Not Currently    Alcohol/week: 0.0 standard drinks   Drug use: Not Currently   Sexual activity: Not on file  Other Topics Concern   Not on file  Social History Narrative   Not on file   Social Determinants of Health   Financial Resource Strain: Not on file  Food Insecurity: Not on file  Transportation Needs: Not on file  Physical Activity: Not on file  Stress: Not on file  Social Connections: Not on file  Intimate Partner Violence: Not on file  Family History  Problem Relation Age of Onset   Stroke Mother    Cancer Father    Breast cancer Sister 104   Diabetes Brother     Objective: Office vital signs reviewed. BP (!) 148/86 Comment: manual  Pulse 74   Temp (!) 97.4 F (36.3 C)   Ht _0  (1.575 m)   Wt 126 lb 12.8 oz (57.5 kg)   SpO2 99%   BMI 23.19 kg/m   Physical Examination:  General: Awake, alert, well nourished, No acute distress Pulm: normal WOB on room air  Assessment/ Plan: 65 y.o. female   Factor V Leiden mutation ([name]) - Plan: CoaguChek XS/INR Waived  Warfarin anticoagulation - Plan: CoaguChek XS/INR Waived  INR slightly supratherapeutic at 3.1.  However, she has not been incorporating as many vegetables as she normally does is currently not having any red flag symptoms or signs.  She may  continue current regimen and follow-up in 6 to 8 weeks, sooner if needed  No orders of the defined types were placed in this encounter.  No orders of the defined types were placed in this encounter.    Emily Norlander, DO [place] [phone]

## 2020-11-17 DIAGNOSIS — B078 Other viral warts: Secondary | ICD-10-CM | POA: Diagnosis not present

## 2020-11-17 DIAGNOSIS — X32XXXD Exposure to sunlight, subsequent encounter: Secondary | ICD-10-CM | POA: Diagnosis not present

## 2020-11-17 DIAGNOSIS — L570 Actinic keratosis: Secondary | ICD-10-CM | POA: Diagnosis not present

## 2020-12-04 ENCOUNTER — Other Ambulatory Visit: Payer: Self-pay | Admitting: Family Medicine

## 2020-12-08 ENCOUNTER — Ambulatory Visit: Payer: BC Managed Care – PPO | Admitting: Podiatry

## 2020-12-08 ENCOUNTER — Ambulatory Visit (INDEPENDENT_AMBULATORY_CARE_PROVIDER_SITE_OTHER): Payer: BC Managed Care – PPO

## 2020-12-08 ENCOUNTER — Other Ambulatory Visit: Payer: Self-pay

## 2020-12-08 ENCOUNTER — Encounter: Payer: Self-pay | Admitting: Podiatry

## 2020-12-08 DIAGNOSIS — M778 Other enthesopathies, not elsewhere classified: Secondary | ICD-10-CM

## 2020-12-08 DIAGNOSIS — L84 Corns and callosities: Secondary | ICD-10-CM | POA: Diagnosis not present

## 2020-12-08 DIAGNOSIS — M21619 Bunion of unspecified foot: Secondary | ICD-10-CM

## 2020-12-08 DIAGNOSIS — M21622 Bunionette of left foot: Secondary | ICD-10-CM

## 2020-12-08 DIAGNOSIS — M21612 Bunion of left foot: Secondary | ICD-10-CM

## 2020-12-08 MED ORDER — TRIAMCINOLONE ACETONIDE 10 MG/ML IJ SUSP
10.0000 mg | Freq: Once | INTRAMUSCULAR | Status: AC
  Administered 2020-12-08: 10 mg

## 2020-12-12 NOTE — Progress Notes (Signed)
Subjective:   Patient ID: [name], female   DOB: 65 y.o.   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  This may require surgical intervention at 1 point in future but at this point we are going to try sterile prep and injected around the fifth MPJ 3 mg dexamethasone Kenalog 5 mg Xylocaine and went ahead today advised on soaks and wider shoes.  May require other treatments or possible surgical intervention  X-rays indicate that there is enlargement around the fifth MPJ left with moderate structural bunion deformity

## 2021-01-06 ENCOUNTER — Ambulatory Visit: Payer: BC Managed Care – PPO | Admitting: Family Medicine

## 2021-01-06 ENCOUNTER — Encounter: Payer: Self-pay | Admitting: Family Medicine

## 2021-01-06 ENCOUNTER — Other Ambulatory Visit: Payer: Self-pay

## 2021-01-06 VITALS — BP 149/79 | HR 70 | Temp 97.9°F | Ht 62.0 in | Wt 127.8 lb

## 2021-01-06 DIAGNOSIS — R791 Abnormal coagulation profile: Secondary | ICD-10-CM | POA: Diagnosis not present

## 2021-01-06 DIAGNOSIS — D6851 Activated protein C resistance: Secondary | ICD-10-CM

## 2021-01-06 LAB — HEMOGLOBIN, FINGERSTICK: Hemoglobin: 13.6 g/dL (ref 11.1–15.9)

## 2021-01-06 LAB — COAGUCHEK XS/INR WAIVED
INR: 3.9 — ABNORMAL HIGH (ref 0.9–1.1)
Prothrombin Time: 46.6 s

## 2021-01-06 MED ORDER — TRULANCE 3 MG PO TABS: 1.0000 | ORAL_TABLET | Freq: Every day | ORAL | 0 refills | Status: DC

## 2021-01-06 NOTE — Progress Notes (Signed)
Subjective: CC: Factor V Leyden mutation PCP: [name], DO HPI:[name] is a 65 y.o. female presenting to clinic today for:  1.  Factor V Leyden mutation Patient was last seen about 6 weeks ago.  INR was 3.1 at that time.  She admits that she has not been eating her normal amounts of greens and was not surprised when her INR was high today at 3.9.  She denies any bleeding episodes or other concerning features.  Continues to take 15 mg twice weekly with 10 mg daily otherwise.  Last 15 mg dose was yesterday   ROS: Per HPI  Allergies  Allergen Reactions   Codeine    Donnatal [Belladonna Alk-Phenobarb Er]    Librium    Past Medical History:  Diagnosis Date   Anemia     Current Outpatient Medications:    cephALEXin (KEFLEX) 500 MG capsule, Take 1 capsule (500 mg total) by mouth 2 (two) times daily., Disp: 14 capsule, Rfl: 0   ESTRACE VAGINAL 0.1 MG/GM vaginal cream, Apply vaginally as directed, Disp: , Rfl:    fluticasone (FLONASE) 50 MCG/ACT nasal spray, Place 2 sprays into the nose daily. Reported on 05/13/2015, Disp: , Rfl:    hydrochlorothiazide (MICROZIDE) 12.5 MG capsule, TAKE ONE (1) CAPSULE EACH DAY, Disp: 90 capsule, Rfl: 0   ibandronate (BONIVA) 150 MG tablet, Take 150 mg by mouth every 30 (thirty) days. Take in the morning with a full glass of water, on an empty stomach, and do not take anything else by mouth or lie down for the next 30 min., Disp: , Rfl:    Ketoprofen 10 % CREA, Apply to affected areas 1-2 times daily as needed, Disp: 60 g, Rfl: prn   lisinopril (ZESTRIL) 5 MG tablet, Take 5 mg by mouth daily., Disp: , Rfl:    loratadine (CLARITIN) 10 MG tablet, Take 1 tablet (10 mg total) by mouth daily., Disp: 30 tablet, Rfl: 1   potassium chloride SA (KLOR-CON) 20 MEQ tablet, Take 1 tablet (20 mEq total) by mouth 2 (two) times daily., Disp: 8 tablet, Rfl: 0   warfarin (COUMADIN) 5 MG tablet, $RemoveBe'15mg'nvqZYzhvv$  Tues & Thursday and $RemoveBefo'10mg'nqpwhkstncl$  daily all other days, Disp: 180  tablet, Rfl: 0 Social History   Socioeconomic History   Marital status: Married    Spouse name: Not on file   Number of children: Not on file   Years of education: Not on file   Highest education level: Not on file  Occupational History   Not on file  Tobacco Use   Smoking status: Never   Smokeless tobacco: Never  Vaping Use   Vaping Use: Never used  Substance and Sexual Activity   Alcohol use: Not Currently    Alcohol/week: 0.0 standard drinks   Drug use: Not Currently   Sexual activity: Not on file  Other Topics Concern   Not on file  Social History Narrative   Not on file   Social Determinants of Health   Financial Resource Strain: Not on file  Food Insecurity: Not on file  Transportation Needs: Not on file  Physical Activity: Not on file  Stress: Not on file  Social Connections: Not on file  Intimate Partner Violence: Not on file   Family History  Problem Relation Age of Onset   Stroke Mother    Cancer Father    Breast cancer Sister 67   Diabetes Brother     Objective: Office vital signs reviewed. BP (!) 149/79  Pulse 70   Temp 97.9 F (36.6 C)   Ht $R'5\' 2"'wl$  (1.575 m)   Wt 127 lb 12.8 oz (58 kg)   SpO2 97%   BMI 23.37 kg/m   Physical Examination:  General: Awake, alert, well nourished, No acute distress Cardio: regular rate and rhythm, S1S2 heard, no murmurs appreciated Pulm: clear to auscultation bilaterally, no wheezes, rhonchi or rales; normal work of breathing on room air  Assessment/ Plan: 65 y.o. female   Supratherapeutic INR  Factor V Leiden mutation ([place]) - Plan: CoaguChek XS/INR Waived, CMP14+EGFR, Hemoglobin, fingerstick  INR supratherapeutic.  Advised to hold today's dose.  May resume 10 mg daily with only 1 day of 15 mg.  Increase greens.  Seen 2 weeks for recheck INR  No anemia  Orders Placed This Encounter  Procedures   CoaguChek XS/INR Waived   CMP14+EGFR   Hemoglobin, fingerstick   No orders of the defined types were  placed in this encounter.    [name], DO [place] [phone]

## 2021-01-06 NOTE — Patient Instructions (Signed)
10mg  daily except 15mg  ONE day per week  SKIP TODAY's med.  EAT GREENS. See me in 2 weeks.

## 2021-01-07 LAB — CMP14+EGFR
ALT: 21 IU/L (ref 0–32)
AST: 28 IU/L (ref 0–40)
Albumin/Globulin Ratio: 2.5 — ABNORMAL HIGH (ref 1.2–2.2)
Albumin: 4.5 g/dL (ref 3.8–4.8)
Alkaline Phosphatase: 60 IU/L (ref 44–121)
BUN/Creatinine Ratio: 27 (ref 12–28)
BUN: 18 mg/dL (ref 8–27)
Bilirubin Total: 0.3 mg/dL (ref 0.0–1.2)
CO2: 24 mmol/L (ref 20–29)
Calcium: 8.9 mg/dL (ref 8.7–10.3)
Chloride: 91 mmol/L — ABNORMAL LOW (ref 96–106)
Creatinine, Ser: 0.66 mg/dL (ref 0.57–1.00)
Globulin, Total: 1.8 g/dL (ref 1.5–4.5)
Glucose: 108 mg/dL — ABNORMAL HIGH (ref 65–99)
Potassium: 4 mmol/L (ref 3.5–5.2)
Sodium: 129 mmol/L — ABNORMAL LOW (ref 134–144)
Total Protein: 6.3 g/dL (ref 6.0–8.5)
eGFR: 99 mL/min/{1.73_m2} (ref >59)

## 2021-01-12 ENCOUNTER — Telehealth: Payer: Self-pay | Admitting: *Deleted

## 2021-01-12 NOTE — Telephone Encounter (Signed)
Patient is calling because the injection given in July has worn off and the pain has returned. Please call to schedule.

## 2021-01-15 ENCOUNTER — Other Ambulatory Visit: Payer: Self-pay | Admitting: Family Medicine

## 2021-01-15 DIAGNOSIS — D6851 Activated protein C resistance: Secondary | ICD-10-CM

## 2021-01-20 ENCOUNTER — Encounter: Payer: Self-pay | Admitting: Family Medicine

## 2021-01-20 ENCOUNTER — Other Ambulatory Visit: Payer: Self-pay

## 2021-01-20 ENCOUNTER — Ambulatory Visit: Payer: BC Managed Care – PPO | Admitting: Family Medicine

## 2021-01-20 VITALS — BP 131/70 | HR 71 | Temp 97.7°F | Ht 62.0 in | Wt 126.8 lb

## 2021-01-20 DIAGNOSIS — D6851 Activated protein C resistance: Secondary | ICD-10-CM | POA: Diagnosis not present

## 2021-01-20 DIAGNOSIS — Z7901 Long term (current) use of anticoagulants: Secondary | ICD-10-CM | POA: Diagnosis not present

## 2021-01-20 LAB — COAGUCHEK XS/INR WAIVED
INR: 2.2 — ABNORMAL HIGH (ref 0.9–1.1)
Prothrombin Time: 27 s

## 2021-01-20 NOTE — Progress Notes (Signed)
Subjective: CC: INR check PCP: Raliegh Ip, DO HPI:[name] is a 65 y.o. female presenting to clinic today for:  1. INR INR notably supratherapeutic at last visit.  She is been compliant with 15 mg 1 day/week and 10 mg daily all other days.  No bleeding reported.  Continues to incorporate greens into her diet   ROS: Per HPI  Allergies  Allergen Reactions   Codeine    Donnatal [Belladonna Alk-Phenobarb Er]    Librium    Past Medical History:  Diagnosis Date   Anemia     Current Outpatient Medications:    ESTRACE VAGINAL 0.1 MG/GM vaginal cream, Apply vaginally as directed, Disp: , Rfl:    fluticasone (FLONASE) 50 MCG/ACT nasal spray, Place 2 sprays into the nose daily. Reported on 05/13/2015, Disp: , Rfl:    hydrochlorothiazide (MICROZIDE) 12.5 MG capsule, TAKE ONE (1) CAPSULE EACH DAY, Disp: 90 capsule, Rfl: 0   ibandronate (BONIVA) 150 MG tablet, Take 150 mg by mouth every 30 (thirty) days. Take in the morning with a full glass of water, on an empty stomach, and do not take anything else by mouth or lie down for the next 30 min., Disp: , Rfl:    Ketoprofen 10 % CREA, Apply to affected areas 1-2 times daily as needed, Disp: 60 g, Rfl: prn   lisinopril (ZESTRIL) 5 MG tablet, Take 5 mg by mouth daily., Disp: , Rfl:    loratadine (CLARITIN) 10 MG tablet, Take 1 tablet (10 mg total) by mouth daily., Disp: 30 tablet, Rfl: 1   Plecanatide (TRULANCE) 3 MG TABS, Take 1 tablet by mouth daily., Disp: 4 tablet, Rfl: 0   potassium chloride SA (KLOR-CON) 20 MEQ tablet, Take 1 tablet (20 mEq total) by mouth 2 (two) times daily., Disp: 8 tablet, Rfl: 0   warfarin (COUMADIN) 5 MG tablet, TAKE 15MG  ON TUESDAY & THURSDAY TAKE 10MG  ALL OTHER DAYS, Disp: 180 tablet, Rfl: 0 Social History   Socioeconomic History   Marital status: Married    Spouse name: Not on file   Number of children: Not on file   Years of education: Not on file   Highest education level: Not on file   Occupational History   Not on file  Tobacco Use   Smoking status: Never   Smokeless tobacco: Never  Vaping Use   Vaping Use: Never used  Substance and Sexual Activity   Alcohol use: Not Currently    Alcohol/week: 0.0 standard drinks   Drug use: Not Currently   Sexual activity: Not on file  Other Topics Concern   Not on file  Social History Narrative   Not on file   Social Determinants of Health   Financial Resource Strain: Not on file  Food Insecurity: Not on file  Transportation Needs: Not on file  Physical Activity: Not on file  Stress: Not on file  Social Connections: Not on file  Intimate Partner Violence: Not on file   Family History  Problem Relation Age of Onset   Stroke Mother    Cancer Father    Breast cancer Sister 36   Diabetes Brother     Objective: Office vital signs reviewed. BP 131/70   Pulse 71   Temp 97.7 F (36.5 C) (Temporal)   Ht 5\' 2"  (1.575 m)   Wt 126 lb 12.8 oz (57.5 kg)   BMI 23.19 kg/m   Physical Examination:  General: Awake, alert, well appearing, well nourished, No acute distress  Assessment/ Plan: 65 y.o. female   Factor V Leiden mutation ([place]) - Plan: CoaguChek XS/INR Waived  Warfarin anticoagulation - Plan: CoaguChek XS/INR Waived  INR therapeutic at 2.2.  Continue current regimen with 10 mg daily except for 1 day/week where she will take 15 mg.  May follow-up in 6 to 8 weeks, sooner if concerns arise  No orders of the defined types were placed in this encounter.  No orders of the defined types were placed in this encounter.    [name], DO [place] [phone]

## 2021-02-14 ENCOUNTER — Ambulatory Visit: Payer: BC Managed Care – PPO | Admitting: Family Medicine

## 2021-02-27 ENCOUNTER — Other Ambulatory Visit: Payer: Self-pay | Admitting: Family Medicine

## 2021-03-27 ENCOUNTER — Ambulatory Visit: Payer: BC Managed Care – PPO | Admitting: Family Medicine

## 2021-03-28 ENCOUNTER — Ambulatory Visit (INDEPENDENT_AMBULATORY_CARE_PROVIDER_SITE_OTHER): Payer: BC Managed Care – PPO | Admitting: Nurse Practitioner

## 2021-03-28 ENCOUNTER — Encounter: Payer: Self-pay | Admitting: Nurse Practitioner

## 2021-03-28 DIAGNOSIS — J0100 Acute maxillary sinusitis, unspecified: Secondary | ICD-10-CM

## 2021-03-28 MED ORDER — FLUTICASONE PROPIONATE 50 MCG/ACT NA SUSP: 2.0000 | Freq: Every day | NASAL | 6 refills | Status: DC

## 2021-03-28 MED ORDER — AZITHROMYCIN 250 MG PO TABS
ORAL_TABLET | ORAL | 0 refills | Status: DC
Start: 2021-03-28 — End: 2021-06-02

## 2021-03-28 NOTE — Progress Notes (Signed)
Virtual Visit via video Note   Due to COVID-19 pandemic this visit was conducted virtually. This visit type was conducted due to national recommendations for restrictions regarding the COVID-19 Pandemic (e.g. social distancing, sheltering in place) in an effort to limit this patient's exposure and mitigate transmission in our community. All issues noted in this document were discussed and addressed.  A physical exam was not performed with this format.  I connected with  [name]  on 03/28/21 at 8:43 by video and verified that I am speaking with the correct person using two identifiers. [name] is currently located at home and no one  is currently with her during visit. The provider, Mary-Margaret Daphine Deutscher, FNP is located in their office at time of visit.  I discussed the limitations, risks, security and privacy concerns of performing an evaluation and management service by video  and the availability of in person appointments. I also discussed with the patient that there may be a patient responsible charge related to this service. The patient expressed understanding and agreed to proceed.   History and Present Illness:  Patient says that she has had a stuffy head for over a month. She has been sneezing and congestion. Runny nose at times. Nose is dry. No fever. She gets sinus infections frequently.   Review of Systems  Constitutional:  Positive for malaise/fatigue. Negative for chills and fever.  HENT:  Positive for congestion. Negative for ear pain and sore throat.   Respiratory:  Negative for cough and sputum production.   Musculoskeletal:  Negative for myalgias.  Neurological:  Negative for dizziness and headaches.      Observations/Objective: Alert and oriented- answers all questions appropriately No distress Voice hoarse No cough during visit  Assessment and Plan: [name] in today with chief complaint of No chief complaint on file.   1. Acute non-recurrent  maxillary sinusitis 1. Take meds as prescribed 2. Use a cool mist humidifier especially during the winter months and when heat has been humid. 3. Use saline nose sprays frequently 4. Saline irrigations of the nose can be very helpful if done frequently.  * 4X daily for 1 week*  * Use of a nettie pot can be helpful with this. Follow directions with this* 5. Drink plenty of fluids 6. Keep thermostat turn down low 7.For any cough or congestion  Use plain Mucinex- regular strength or max strength is fine   * Children- consult with Pharmacist for dosing 8. For fever or aces or pains- take tylenol or ibuprofen appropriate for age and weight.  * for fevers greater than 101 orally you may alternate ibuprofen and tylenol every  3 hours.   Current Outpatient Medications on File Prior to Visit  Medication Sig Dispense Refill   ESTRACE VAGINAL 0.1 MG/GM vaginal cream Apply vaginally as directed     hydrochlorothiazide (MICROZIDE) 12.5 MG capsule TAKE ONE (1) CAPSULE EACH DAY 90 capsule 0   ibandronate (BONIVA) 150 MG tablet Take 150 mg by mouth every 30 (thirty) days. Take in the morning with a full glass of water, on an empty stomach, and do not take anything else by mouth or lie down for the next 30 min.     Ketoprofen 10 % CREA Apply to affected areas 1-2 times daily as needed 60 g prn   lisinopril (ZESTRIL) 5 MG tablet Take 5 mg by mouth daily.     loratadine (CLARITIN) 10 MG tablet Take 1 tablet (10 mg total) by  mouth daily. 30 tablet 1   Plecanatide (TRULANCE) 3 MG TABS Take 1 tablet by mouth daily. 4 tablet 0   potassium chloride SA (KLOR-CON) 20 MEQ tablet Take 1 tablet (20 mEq total) by mouth 2 (two) times daily. 8 tablet 0   warfarin (COUMADIN) 5 MG tablet TAKE 15MG  ON TUESDAY & THURSDAY TAKE 10MG  ALL OTHER DAYS 180 tablet 0   No current facility-administered medications on file prior to visit.   Get INR checked within a week    Follow Up Instructions: Prn     I discussed the  assessment and treatment plan with the patient. The patient was provided an opportunity to ask questions and all were answered. The patient agreed with the plan and demonstrated an understanding of the instructions.   The patient was advised to call back or seek an in-person evaluation if the symptoms worsen or if the condition fails to improve as anticipated.  The above assessment and management plan was discussed with the patient. The patient verbalized understanding of and has agreed to the management plan. Patient is aware to call the clinic if symptoms persist or worsen. Patient is aware when to return to the clinic for a follow-up visit. Patient educated on when it is appropriate to go to the emergency department.   Time call ended:8:53  I provided 10 minutes of face-to-face time during this encounter.    Mary-Margaret , FNP

## 2021-03-28 NOTE — Patient Instructions (Signed)

## 2021-03-31 ENCOUNTER — Other Ambulatory Visit: Payer: Self-pay

## 2021-03-31 ENCOUNTER — Encounter: Payer: Self-pay | Admitting: Family Medicine

## 2021-03-31 ENCOUNTER — Ambulatory Visit (INDEPENDENT_AMBULATORY_CARE_PROVIDER_SITE_OTHER): Payer: BC Managed Care – PPO | Admitting: Family Medicine

## 2021-03-31 VITALS — BP 139/74 | HR 66 | Temp 98.0°F | Ht 62.0 in | Wt 128.2 lb

## 2021-03-31 DIAGNOSIS — D6851 Activated protein C resistance: Secondary | ICD-10-CM | POA: Diagnosis not present

## 2021-03-31 DIAGNOSIS — Z7901 Long term (current) use of anticoagulants: Secondary | ICD-10-CM | POA: Diagnosis not present

## 2021-03-31 LAB — COAGUCHEK XS/INR WAIVED
INR: 2.4 — ABNORMAL HIGH (ref 0.9–1.1)
Prothrombin Time: 28.7 s

## 2021-03-31 NOTE — Progress Notes (Signed)
Subjective: CC:INR check PCP: [name], DO HPI:[name] is a 65 y.o. female presenting to clinic today for:  1.  Factor V Leyden mutation Patient here for INR check.  Goal INR 2-3.  Her last INR was therapeutic approximately 6-8 weeks ago.  She was recently placed on a Z-Pak.  She has been eating her greens as normal.  No bleeding report.  Compliant with Coumadin.   ROS: Per HPI  Allergies  Allergen Reactions   Codeine    Donnatal [Belladonna Alk-Phenobarb Er]    Librium    Past Medical History:  Diagnosis Date   Anemia     Current Outpatient Medications:    azithromycin (ZITHROMAX Z-PAK) 250 MG tablet, As directed, Disp: 6 tablet, Rfl: 0   ESTRACE VAGINAL 0.1 MG/GM vaginal cream, Apply vaginally as directed, Disp: , Rfl:    fluticasone (FLONASE) 50 MCG/ACT nasal spray, Place 2 sprays into both nostrils daily., Disp: 16 g, Rfl: 6   hydrochlorothiazide (MICROZIDE) 12.5 MG capsule, TAKE ONE (1) CAPSULE EACH DAY, Disp: 90 capsule, Rfl: 0   ibandronate (BONIVA) 150 MG tablet, Take 150 mg by mouth every 30 (thirty) days. Take in the morning with a full glass of water, on an empty stomach, and do not take anything else by mouth or lie down for the next 30 min., Disp: , Rfl:    Ketoprofen 10 % CREA, Apply to affected areas 1-2 times daily as needed, Disp: 60 g, Rfl: prn   lisinopril (ZESTRIL) 5 MG tablet, Take 5 mg by mouth daily., Disp: , Rfl:    loratadine (CLARITIN) 10 MG tablet, Take 1 tablet (10 mg total) by mouth daily., Disp: 30 tablet, Rfl: 1   Plecanatide (TRULANCE) 3 MG TABS, Take 1 tablet by mouth daily., Disp: 4 tablet, Rfl: 0   potassium chloride SA (KLOR-CON) 20 MEQ tablet, Take 1 tablet (20 mEq total) by mouth 2 (two) times daily., Disp: 8 tablet, Rfl: 0   warfarin (COUMADIN) 5 MG tablet, TAKE 15MG ON TUESDAY & THURSDAY TAKE 10MG ALL OTHER DAYS, Disp: 180 tablet, Rfl: 0 Social History   Socioeconomic History   Marital status: Married    Spouse name:  Not on file   Number of children: Not on file   Years of education: Not on file   Highest education level: Not on file  Occupational History   Not on file  Tobacco Use   Smoking status: Never   Smokeless tobacco: Never  Vaping Use   Vaping Use: Never used  Substance and Sexual Activity   Alcohol use: Not Currently    Alcohol/week: 0.0 standard drinks   Drug use: Not Currently   Sexual activity: Not on file  Other Topics Concern   Not on file  Social History Narrative   Not on file   Social Determinants of Health   Financial Resource Strain: Not on file  Food Insecurity: Not on file  Transportation Needs: Not on file  Physical Activity: Not on file  Stress: Not on file  Social Connections: Not on file  Intimate Partner Violence: Not on file   Family History  Problem Relation Age of Onset   Stroke Mother    Cancer Father    Breast cancer Sister 15   Diabetes Brother     Objective: Office vital signs reviewed. BP 139/74   Pulse 66   Temp 98 F (36.7 C)   Ht _0  (1.575 m)   Wt 128 lb 3.2 oz (  58.2 kg)   SpO2 95%   BMI 23.45 kg/m   Physical Examination:  General: Awake, alert, well nourished, No acute distress Cardio: regular rate and rhythm, S1S2 heard, no murmurs appreciated Pulm: clear to auscultation bilaterally, no wheezes, rhonchi or rales; normal work of breathing on room air  Assessment/ Plan: 65 y.o. female   Factor V Leiden mutation ([place]) - Plan: CoaguChek XS/INR Waived  Warfarin anticoagulation - Plan: CoaguChek XS/INR Waived  INR therapeutic despite use of Z-Pak.  No changes.  May follow-up in 6 to 8 weeks     No orders of the defined types were placed in this encounter.  No orders of the defined types were placed in this encounter.    [name], DO [place] [phone]

## 2021-04-05 DIAGNOSIS — Z1231 Encounter for screening mammogram for malignant neoplasm of breast: Secondary | ICD-10-CM | POA: Diagnosis not present

## 2021-04-05 DIAGNOSIS — Z6824 Body mass index (BMI) 24.0-24.9, adult: Secondary | ICD-10-CM | POA: Diagnosis not present

## 2021-04-05 DIAGNOSIS — Z01419 Encounter for gynecological examination (general) (routine) without abnormal findings: Secondary | ICD-10-CM | POA: Diagnosis not present

## 2021-04-05 DIAGNOSIS — M810 Age-related osteoporosis without current pathological fracture: Secondary | ICD-10-CM | POA: Insufficient documentation

## 2021-04-06 ENCOUNTER — Other Ambulatory Visit: Payer: Self-pay

## 2021-04-06 ENCOUNTER — Ambulatory Visit (INDEPENDENT_AMBULATORY_CARE_PROVIDER_SITE_OTHER): Payer: BC Managed Care – PPO

## 2021-04-06 DIAGNOSIS — Z23 Encounter for immunization: Secondary | ICD-10-CM | POA: Diagnosis not present

## 2021-04-07 ENCOUNTER — Other Ambulatory Visit: Payer: Self-pay | Admitting: Obstetrics and Gynecology

## 2021-04-07 DIAGNOSIS — Z803 Family history of malignant neoplasm of breast: Secondary | ICD-10-CM

## 2021-04-10 ENCOUNTER — Other Ambulatory Visit: Payer: Self-pay | Admitting: Family Medicine

## 2021-04-10 ENCOUNTER — Ambulatory Visit: Payer: BC Managed Care – PPO

## 2021-04-10 DIAGNOSIS — D6851 Activated protein C resistance: Secondary | ICD-10-CM

## 2021-04-19 ENCOUNTER — Other Ambulatory Visit: Payer: BC Managed Care – PPO

## 2021-04-19 DIAGNOSIS — Z13220 Encounter for screening for lipoid disorders: Secondary | ICD-10-CM | POA: Diagnosis not present

## 2021-04-19 DIAGNOSIS — Z1329 Encounter for screening for other suspected endocrine disorder: Secondary | ICD-10-CM | POA: Diagnosis not present

## 2021-04-19 DIAGNOSIS — Z1321 Encounter for screening for nutritional disorder: Secondary | ICD-10-CM | POA: Diagnosis not present

## 2021-06-02 ENCOUNTER — Encounter: Payer: Self-pay | Admitting: Family Medicine

## 2021-06-02 ENCOUNTER — Ambulatory Visit: Payer: 59 | Admitting: Family Medicine

## 2021-06-02 VITALS — BP 117/66 | HR 74 | Temp 97.7°F | Ht 62.0 in | Wt 130.0 lb

## 2021-06-02 DIAGNOSIS — Z7901 Long term (current) use of anticoagulants: Secondary | ICD-10-CM

## 2021-06-02 DIAGNOSIS — W19XXXA Unspecified fall, initial encounter: Secondary | ICD-10-CM

## 2021-06-02 DIAGNOSIS — E871 Hypo-osmolality and hyponatremia: Secondary | ICD-10-CM | POA: Diagnosis not present

## 2021-06-02 DIAGNOSIS — D6851 Activated protein C resistance: Secondary | ICD-10-CM

## 2021-06-02 DIAGNOSIS — K1379 Other lesions of oral mucosa: Secondary | ICD-10-CM | POA: Diagnosis not present

## 2021-06-02 LAB — COAGUCHEK XS/INR WAIVED
INR: 2.1 — ABNORMAL HIGH (ref 0.9–1.1)
Prothrombin Time: 24.6 s

## 2021-06-02 NOTE — Progress Notes (Signed)
Subjective: CC: INR check PCP: [name], DO HPI:[name] is a 66 y.o. female presenting to clinic today for:  1.  Factor V Leyden mutation Patient is compliant with Coumadin for factor V Leyden mutation.  She does not report any bleeding.  She unfortunately sustained a fall while walking today and has some bruising on her palms and knees but has not had any bleeding  2.  Mouth lesion Patient reports of mouth lesion on the left.  She saw her dentist and he said that he could resect this but she wondered if she would need to come off anticoagulation for that.  She does brighten intermittently and does want a get it taken off  3.  Hyponatremia Patient noted to have mildly low sodium level at last laboratory draw.  She admits that she does not add much salt to her diet because of her family history of hypertension.  She takes hydrochlorothiazide daily.  Denies any dizziness, weakness.  ROS: Per HPI  Allergies  Allergen Reactions   Codeine    Donnatal [Belladonna Alk-Phenobarb Er]    Librium    Past Medical History:  Diagnosis Date   Anemia     Current Outpatient Medications:    ESTRACE VAGINAL 0.1 MG/GM vaginal cream, Apply vaginally as directed, Disp: , Rfl:    fluticasone (FLONASE) 50 MCG/ACT nasal spray, Place 2 sprays into both nostrils daily., Disp: 16 g, Rfl: 6   hydrochlorothiazide (MICROZIDE) 12.5 MG capsule, TAKE ONE (1) CAPSULE EACH DAY, Disp: 90 capsule, Rfl: 0   ibandronate (BONIVA) 150 MG tablet, Take 150 mg by mouth every 30 (thirty) days. Take in the morning with a full glass of water, on an empty stomach, and do not take anything else by mouth or lie down for the next 30 min., Disp: , Rfl:    Ketoprofen 10 % CREA, Apply to affected areas 1-2 times daily as needed, Disp: 60 g, Rfl: prn   loratadine (CLARITIN) 10 MG tablet, Take 1 tablet (10 mg total) by mouth daily., Disp: 30 tablet, Rfl: 1   warfarin (COUMADIN) 5 MG tablet, TAKE 15MG ON TUESDAY &  THURSDAY TAKE 10MG ALL OTHER DAYS, Disp: 180 tablet, Rfl: 0 Social History   Socioeconomic History   Marital status: Married    Spouse name: Not on file   Number of children: Not on file   Years of education: Not on file   Highest education level: Not on file  Occupational History   Not on file  Tobacco Use   Smoking status: Never   Smokeless tobacco: Never  Vaping Use   Vaping Use: Never used  Substance and Sexual Activity   Alcohol use: Not Currently    Alcohol/week: 0.0 standard drinks   Drug use: Not Currently   Sexual activity: Not on file  Other Topics Concern   Not on file  Social History Narrative   Not on file   Social Determinants of Health   Financial Resource Strain: Not on file  Food Insecurity: Not on file  Transportation Needs: Not on file  Physical Activity: Not on file  Stress: Not on file  Social Connections: Not on file  Intimate Partner Violence: Not on file   Family History  Problem Relation Age of Onset   Stroke Mother    Cancer Father    Breast cancer Sister 4   Diabetes Brother     Objective: Office vital signs reviewed. BP 117/66    Pulse 74  Temp 97.7 F (36.5 C) (Temporal)    Ht _0  (1.575 m)    Wt 130 lb (59 kg)    BMI 23.78 kg/m   Physical Examination:  General: Awake, alert, well nourished, No acute distress HEENT: Left inner cheek with lesion consistent with mucocele Cardio: regular rate   Pulm: Normal work of breathing on room air  Assessment/ Plan: 66 y.o. female   Factor V Leiden mutation ([place]) - Plan: CoaguChek XS/INR Waived  Warfarin anticoagulation - Plan: CoaguChek XS/INR Waived  Mucocele of mouth  Fall, initial encounter  Hyponatremia  INR therapeutic at 2.1 today.  No changes.  May follow-up in 6 to 8 weeks, sooner if concerns arise  Lesion inside mouth appears to be consistent with mucocele.  She will be seeing her dentist for this soon for resection.  I do not think she will need to come off of  anticoagulation for that  No evidence of skin breakdown or fracture secondary to fall.  Hyponatremia noted on last laboratory draw.  She is asymptomatic.  We discussed addition of salt into the diet and potentially discontinuing the diuretic.  Orders Placed This Encounter  Procedures   CoaguChek XS/INR Waived   No orders of the defined types were placed in this encounter.    [name], DO [place] [phone]

## 2021-06-02 NOTE — Patient Instructions (Signed)
Hyponatremia ?Hyponatremia is when the amount of salt (sodium) in your blood is too low. When salt levels are low, your body cells may take in extra water. This can cause swelling. The swelling often affects the brain. ?What are the causes? ?Certain medical problems or conditions. ?Vomiting a lot. ?Having watery poop (diarrhea) often. ?Sweating too much. ?Taking certain medicines or using illegal drugs. ?Fluids given through an IV tube. ?What increases the risk? ?Having heart, kidney, or liver failure. ?Having a medical condition that causes you to have watery poop a lot. ?Doing very hard exercises. ?Taking medicines that affect the amount of salt that is in your blood. ?What are the signs or symptoms? ?Symptoms of this condition include: ?Headache. ?Feeling like you may vomit (nausea). ?Vomiting. ?Being very tired. ?Muscle weakness and cramps. ?Not wanting to eat as much as normal. ?Feeling weak or dizzy. ?Very bad symptoms of this condition include: ?Confusion. ?Feeling restless. ?Having a fast heart rate. ?Fainting. ?Seizures. ?Coma. ?How is this treated? ?Treatment for this condition depends on the cause. Treatment may include: ?Getting fluids through an IV tube that is put into one of your veins. ?Taking medicines to fix the salt levels in your blood. If medicines are causing the problem, your medicines will need to be changed. ?Limiting how much water or fluid you take in, in some cases. ?Monitoring in the hospital to watch your symptoms. ?Follow these instructions at home: ? ?Take over-the-counter and prescription medicines only as told by your doctor. Many medicines can make this condition worse. Talk with your doctor about any medicines that you are taking. ?Do not drink alcohol. ?Keep all follow-up visits. ?Contact a doctor if: ?You feel more like you may vomit. ?You feel more tired. ?Your headache gets worse. ?You feel more confused. ?You feel weaker. ?Your symptoms go away and then they come back. ?Get  help right away if: ?You have a seizure. ?You faint. ?You keep having watery poop. ?You keep vomiting. ?Summary ?Hyponatremia is when the amount of salt in your blood is too low. ?When salt levels are low, you can have swelling throughout the body. The swelling mostly affects the brain. ?Treatment depends on the cause. ?Treatment may include IV fluids and changing medicines. ?This information is not intended to replace advice given to you by your health care provider. Make sure you discuss any questions you have with your health care provider. ?Document Revised: 11/15/2020 Document Reviewed: 11/15/2020 ?Elsevier Patient Education ? 2022 [employer] ? ?

## 2021-06-04 ENCOUNTER — Other Ambulatory Visit: Payer: Self-pay | Admitting: Family Medicine

## 2021-07-02 ENCOUNTER — Other Ambulatory Visit: Payer: Self-pay | Admitting: Family Medicine

## 2021-07-02 DIAGNOSIS — D6851 Activated protein C resistance: Secondary | ICD-10-CM

## 2021-08-04 ENCOUNTER — Ambulatory Visit: Payer: 59 | Admitting: Family Medicine

## 2021-08-04 VITALS — BP 153/79 | HR 73 | Temp 97.8°F | Ht 62.0 in | Wt 129.2 lb

## 2021-08-04 DIAGNOSIS — D6851 Activated protein C resistance: Secondary | ICD-10-CM

## 2021-08-04 DIAGNOSIS — K5909 Other constipation: Secondary | ICD-10-CM

## 2021-08-04 DIAGNOSIS — R791 Abnormal coagulation profile: Secondary | ICD-10-CM

## 2021-08-04 LAB — COAGUCHEK XS/INR WAIVED
INR: 4.1 — ABNORMAL HIGH (ref 0.9–1.1)
Prothrombin Time: 48.9 s

## 2021-08-04 MED ORDER — LINACLOTIDE 145 MCG PO CAPS: 145.0000 ug | ORAL_CAPSULE | Freq: Every day | ORAL | 12 refills | Status: DC

## 2021-08-04 NOTE — Progress Notes (Signed)
? ?Subjective: ?CC: INR check ?PCP: [name], DO ?HPI:[name] is a 65 y.o. female presenting to clinic today for: ? ?1.  INR check ?Patient chronically anticoagulated with Coumadin.  She takes 15 mg Tuesdays and Thursdays and 10 mg all other days.  She has had no changes in diet or medications since her last visit.  Denies any abnormal bleeding ? ? ?ROS: Per HPI ? ?Allergies  ?Allergen Reactions  ? Codeine   ? Donnatal [Belladonna Alk-Phenobarb Er]   ? Librium   ? ?Past Medical History:  ?Diagnosis Date  ? Anemia   ? ? ?Current Outpatient Medications:  ?  ESTRACE VAGINAL 0.1 MG/GM vaginal cream, Apply vaginally as directed, Disp: , Rfl:  ?  fluticasone (FLONASE) 50 MCG/ACT nasal spray, Place 2 sprays into both nostrils daily., Disp: 16 g, Rfl: 6 ?  hydrochlorothiazide (MICROZIDE) 12.5 MG capsule, TAKE ONE (1) CAPSULE EACH DAY, Disp: 90 capsule, Rfl: 0 ?  ibandronate (BONIVA) 150 MG tablet, Take 150 mg by mouth every 30 (thirty) days. Take in the morning with a full glass of water, on an empty stomach, and do not take anything else by mouth or lie down for the next 30 min., Disp: , Rfl:  ?  Ketoprofen 10 % CREA, Apply to affected areas 1-2 times daily as needed, Disp: 60 g, Rfl: prn ?  loratadine (CLARITIN) 10 MG tablet, Take 1 tablet (10 mg total) by mouth daily., Disp: 30 tablet, Rfl: 1 ?  warfarin (COUMADIN) 5 MG tablet, TAKE $RemoveBefo'15MG'[id number]$  ON TUESDAY & THURSDAY TAKE $RemoveBefor'10MG'[id number]$  ALL OTHER DAYS, Disp: 180 tablet, Rfl: 0 ?Social History  ? ?Socioeconomic History  ? Marital status: Married  ?  Spouse name: Not on file  ? Number of children: Not on file  ? Years of education: Not on file  ? Highest education level: Not on file  ?Occupational History  ? Not on file  ?Tobacco Use  ? Smoking status: Never  ? Smokeless tobacco: Never  ?Vaping Use  ? Vaping Use: Never used  ?Substance and Sexual Activity  ? Alcohol use: Not Currently  ?  Alcohol/week: 0.0 standard drinks  ? Drug use: Not Currently  ? Sexual activity: Not  on file  ?Other Topics Concern  ? Not on file  ?Social History Narrative  ? Not on file  ? ?Social Determinants of Health  ? ?Financial Resource Strain: Not on file  ?Food Insecurity: Not on file  ?Transportation Needs: Not on file  ?Physical Activity: Not on file  ?Stress: Not on file  ?Social Connections: Not on file  ?Intimate Partner Violence: Not on file  ? ?Family History  ?Problem Relation Age of Onset  ? Stroke Mother   ? Cancer Father   ? Breast cancer Sister 39  ? Diabetes Brother   ? ? ?Objective: ?Office vital signs reviewed. ?BP (!) 153/79   Pulse 73   Temp 97.8 ?F (36.6 ?C) (Temporal)   Ht $R'5\' 2"'ff$  (1.575 m)   Wt 129 lb 3.2 oz (58.6 kg)   SpO2 98%   BMI 23.63 kg/m?  ? ?Physical Examination:  ?General: Awake, alert, well nourished, No acute distress ?HEENT: Sclera white ?Cardio: regular rate and rhythm  ?Pulm: Normal work of breathing on room air ? ?Assessment/ Plan: ?66 y.o. female  ? ?Supratherapeutic INR ? ?Factor V Leiden mutation ([place]) - Plan: CoaguChek XS/INR Waived ? ?Chronic constipation - Plan: linaclotide (LINZESS) 145 MCG CAPS capsule ? ?INR supratherapeutic at greater than 4.  She is not having any red flag signs or symptoms.  I advised her to skip today's dose of 10 mg and then resume normal daily dosing thereafter.  Encouraged increase p.o. intake of vegetables.  We will reassess in the next 2 weeks.  If persistently supratherapeutic I will plan to drop her dose to 15 mg only once weekly and 10 mg daily all other days ? ?Orders Placed This Encounter  ?Procedures  ? CoaguChek XS/INR Waived  ? ?No orders of the defined types were placed in this encounter. ? ? ? ?[name], DO ?[place] ?([phone] ? ?

## 2021-08-07 DIAGNOSIS — M13832 Other specified arthritis, left wrist: Secondary | ICD-10-CM | POA: Diagnosis not present

## 2021-08-07 DIAGNOSIS — M79641 Pain in right hand: Secondary | ICD-10-CM | POA: Diagnosis not present

## 2021-08-07 DIAGNOSIS — M79642 Pain in left hand: Secondary | ICD-10-CM | POA: Diagnosis not present

## 2021-08-07 DIAGNOSIS — M13831 Other specified arthritis, right wrist: Secondary | ICD-10-CM | POA: Diagnosis not present

## 2021-08-17 ENCOUNTER — Encounter: Payer: Self-pay | Admitting: Family Medicine

## 2021-08-17 ENCOUNTER — Ambulatory Visit: Payer: 59 | Admitting: Family Medicine

## 2021-08-17 VITALS — BP 121/77 | HR 75 | Temp 98.0°F | Ht 62.0 in | Wt 130.2 lb

## 2021-08-17 DIAGNOSIS — R791 Abnormal coagulation profile: Secondary | ICD-10-CM

## 2021-08-17 DIAGNOSIS — D6851 Activated protein C resistance: Secondary | ICD-10-CM | POA: Diagnosis not present

## 2021-08-18 LAB — COAGUCHEK XS/INR WAIVED
INR: 2.5 — ABNORMAL HIGH (ref 0.9–1.1)
Prothrombin Time: 30.1 s

## 2021-08-18 NOTE — Progress Notes (Signed)
? ?Subjective: ?CC:inr check ?PCP: [name], DO ?HPI:[name] is a 66 y.o. female presenting to clinic today for: ? ?1.  Chronic anticoagulation ?Patient is compliant with her medications.  She has been incorporating her normal fruit and vegetable intake.  No bleeding episodes reported ? ? ?ROS: Per HPI ? ?Allergies  ?Allergen Reactions  ? Codeine   ? Donnatal [Belladonna Alk-Phenobarb Er]   ? Librium   ? ?Past Medical History:  ?Diagnosis Date  ? Anemia   ? ? ?Current Outpatient Medications:  ?  ESTRACE VAGINAL 0.1 MG/GM vaginal cream, Apply vaginally as directed, Disp: , Rfl:  ?  fluticasone (FLONASE) 50 MCG/ACT nasal spray, Place 2 sprays into both nostrils daily., Disp: 16 g, Rfl: 6 ?  hydrochlorothiazide (MICROZIDE) 12.5 MG capsule, TAKE ONE (1) CAPSULE EACH DAY, Disp: 90 capsule, Rfl: 0 ?  ibandronate (BONIVA) 150 MG tablet, Take 150 mg by mouth every 30 (thirty) days. Take in the morning with a full glass of water, on an empty stomach, and do not take anything else by mouth or lie down for the next 30 min., Disp: , Rfl:  ?  Ketoprofen 10 % CREA, Apply to affected areas 1-2 times daily as needed, Disp: 60 g, Rfl: prn ?  linaclotide (LINZESS) 145 MCG CAPS capsule, Take 1 capsule (145 mcg total) by mouth daily before breakfast., Disp: 30 capsule, Rfl: 12 ?  loratadine (CLARITIN) 10 MG tablet, Take 1 tablet (10 mg total) by mouth daily., Disp: 30 tablet, Rfl: 1 ?  warfarin (COUMADIN) 5 MG tablet, TAKE $RemoveBefo'15MG'wPxEwzkztTT$  ON TUESDAY & THURSDAY TAKE $RemoveBefor'10MG'TZwdxCCWmGAz$  ALL OTHER DAYS, Disp: 180 tablet, Rfl: 0 ?Social History  ? ?Socioeconomic History  ? Marital status: Married  ?  Spouse name: Not on file  ? Number of children: Not on file  ? Years of education: Not on file  ? Highest education level: Not on file  ?Occupational History  ? Not on file  ?Tobacco Use  ? Smoking status: Never  ? Smokeless tobacco: Never  ?Vaping Use  ? Vaping Use: Never used  ?Substance and Sexual Activity  ? Alcohol use: Not Currently  ?   Alcohol/week: 0.0 standard drinks  ? Drug use: Not Currently  ? Sexual activity: Not on file  ?Other Topics Concern  ? Not on file  ?Social History Narrative  ? Not on file  ? ?Social Determinants of Health  ? ?Financial Resource Strain: Not on file  ?Food Insecurity: Not on file  ?Transportation Needs: Not on file  ?Physical Activity: Not on file  ?Stress: Not on file  ?Social Connections: Not on file  ?Intimate Partner Violence: Not on file  ? ?Family History  ?Problem Relation Age of Onset  ? Stroke Mother   ? Cancer Father   ? Breast cancer Sister 36  ? Diabetes Brother   ? ? ?Objective: ?Office vital signs reviewed. ?BP 121/77   Pulse 75   Temp 98 ?F (36.7 ?C)   Ht $R'5\' 2"'aj$  (1.575 m)   Wt 130 lb 3.2 oz (59.1 kg)   SpO2 97%   BMI 23.81 kg/m?  ? ?Physical Examination:  ?General: Awake, alert, well nourished, No acute distress ?Cardio: RRR, normal pulses ?Pulm: Normal work of breathing on room air.  No wheezing ? ?Assessment/ Plan: ?66 y.o. female  ? ?Factor V Leiden mutation ([place]) - Plan: CoaguChek XS/INR Waived ? ?INR now therapeutic.  Continue current regimen.  May follow-up in the next 6 to 8 weeks ? ?  Orders Placed This Encounter  ?Procedures  ? CoaguChek XS/INR Waived  ? ?No orders of the defined types were placed in this encounter. ? ? ? ?[name], DO ?[place] ?[phone] ? ?

## 2021-08-27 ENCOUNTER — Other Ambulatory Visit: Payer: Self-pay | Admitting: Family Medicine

## 2021-09-24 ENCOUNTER — Other Ambulatory Visit: Payer: Self-pay | Admitting: Family Medicine

## 2021-09-24 DIAGNOSIS — D6851 Activated protein C resistance: Secondary | ICD-10-CM

## 2021-09-27 ENCOUNTER — Encounter: Payer: Self-pay | Admitting: Podiatry

## 2021-09-27 ENCOUNTER — Ambulatory Visit: Payer: 59 | Admitting: Podiatry

## 2021-09-27 ENCOUNTER — Ambulatory Visit (INDEPENDENT_AMBULATORY_CARE_PROVIDER_SITE_OTHER): Payer: 59

## 2021-09-27 DIAGNOSIS — M778 Other enthesopathies, not elsewhere classified: Secondary | ICD-10-CM

## 2021-09-27 DIAGNOSIS — M76822 Posterior tibial tendinitis, left leg: Secondary | ICD-10-CM | POA: Diagnosis not present

## 2021-09-27 MED ORDER — TRIAMCINOLONE ACETONIDE 10 MG/ML IJ SUSP
10.0000 mg | Freq: Once | INTRAMUSCULAR | Status: AC
  Administered 2021-09-27: 10 mg

## 2021-09-27 NOTE — Progress Notes (Signed)
Subjective:  ? ?Patient ID: [name], female   DOB: 66 y.o.   MRN: [id number]  ? ?HPI ?Patient has developed pain on the inside of her left foot stating its been hurting for several months and she has been active.  Does not remember specific injury and states its been very tender ? ? ?ROS ? ? ?   ?Objective:  ?Physical Exam  ?Neurovascular status intact inflammation pain of the posterior tibial insertion into the navicular left with fluid buildup around the insertional point of the tendon into the navicular bone with no depression of the arch or indications of muscle dysfunction ? ?   ?Assessment:  ?Posterior tibial tendinitis left with inflammation at its insertion navicular ? ?   ?Plan:  ?H&P x-rays reviewed I discussed sterile injection explaining chances for rupture associated with this and she wants the injection I did sterile prep injected the sheath left 3 mg Kenalog 5 mg Xylocaine at insertion navicular and applied fascial brace to lift up the arch and continue insert therapy.  Reappoint to recheck 3 weeks or earlier if needed ? ?X-rays indicate no signs of depression of the arch for pathology around the posterior tib navicular insertion ?   ? ? ?

## 2021-10-09 ENCOUNTER — Other Ambulatory Visit: Payer: Self-pay | Admitting: *Deleted

## 2021-10-09 MED ORDER — KETOPROFEN 10 % EX CREA: TOPICAL_CREAM | CUTANEOUS | 99 refills | Status: DC

## 2021-10-12 ENCOUNTER — Encounter: Payer: Self-pay | Admitting: Family Medicine

## 2021-10-12 ENCOUNTER — Ambulatory Visit: Payer: 59 | Admitting: Family Medicine

## 2021-10-12 VITALS — BP 137/76 | HR 73 | Temp 98.2°F | Ht 62.0 in | Wt 121.0 lb

## 2021-10-12 DIAGNOSIS — Z7901 Long term (current) use of anticoagulants: Secondary | ICD-10-CM | POA: Diagnosis not present

## 2021-10-12 DIAGNOSIS — D6851 Activated protein C resistance: Secondary | ICD-10-CM

## 2021-10-12 LAB — COAGUCHEK XS/INR WAIVED
INR: 2.1 — ABNORMAL HIGH (ref 0.9–1.1)
Prothrombin Time: 25.1 s

## 2021-10-12 LAB — HEMOGLOBIN, FINGERSTICK: Hemoglobin: 14.3 g/dL (ref 11.1–15.9)

## 2021-10-12 NOTE — Progress Notes (Signed)
Subjective: CC: Factor 10 Leyden mutation PCP: [name], DO HPI:[name] is a 66 y.o. female presenting to clinic today for:  1.  Factor V Leyden mutation Patient here for chronic anticoagulation follow-up.  She was last seen on 08/17/2021 and at that time her INR was therapeutic.  She was continued on her normal regimen and is here for 8-week follow-up.  She is doing well compliant with all medications.  Denies any bleeding episodes.  Is waiting until November when she gets on the Medicare to do any physical with fasting labs  ROS: Per HPI  Allergies  Allergen Reactions   Codeine    Donnatal [Belladonna Alk-Phenobarb Er]    Librium    Past Medical History:  Diagnosis Date   Anemia     Current Outpatient Medications:    ESTRACE VAGINAL 0.1 MG/GM vaginal cream, Apply vaginally as directed, Disp: , Rfl:    fluticasone (FLONASE) 50 MCG/ACT nasal spray, Place 2 sprays into both nostrils daily., Disp: 16 g, Rfl: 6   hydrochlorothiazide (MICROZIDE) 12.5 MG capsule, TAKE ONE (1) CAPSULE EACH DAY, Disp: 90 capsule, Rfl: 0   ibandronate (BONIVA) 150 MG tablet, Take 150 mg by mouth every 30 (thirty) days. Take in the morning with a full glass of water, on an empty stomach, and do not take anything else by mouth or lie down for the next 30 min., Disp: , Rfl:    Ketoprofen 10 % CREA, Apply to affected areas 1-2 times daily as needed, Disp: 60 g, Rfl: prn   linaclotide (LINZESS) 145 MCG CAPS capsule, Take 1 capsule (145 mcg total) by mouth daily before breakfast., Disp: 30 capsule, Rfl: 12   loratadine (CLARITIN) 10 MG tablet, Take 1 tablet (10 mg total) by mouth daily., Disp: 30 tablet, Rfl: 1   warfarin (COUMADIN) 5 MG tablet, TAKE $RemoveBefo'15MG'SAjOmeHdtfp$  ON TUESDAY & THURSDAY TAKE $RemoveBefor'10MG'zNaZWqvnwkMi$  ALL OTHER DAYS, Disp: 180 tablet, Rfl: 0 Social History   Socioeconomic History   Marital status: Married    Spouse name: Not on file   Number of children: Not on file   Years of education: Not on file    Highest education level: Not on file  Occupational History   Not on file  Tobacco Use   Smoking status: Never   Smokeless tobacco: Never  Vaping Use   Vaping Use: Never used  Substance and Sexual Activity   Alcohol use: Not Currently    Alcohol/week: 0.0 standard drinks   Drug use: Not Currently   Sexual activity: Not on file  Other Topics Concern   Not on file  Social History Narrative   Not on file   Social Determinants of Health   Financial Resource Strain: Not on file  Food Insecurity: Not on file  Transportation Needs: Not on file  Physical Activity: Not on file  Stress: Not on file  Social Connections: Not on file  Intimate Partner Violence: Not on file   Family History  Problem Relation Age of Onset   Stroke Mother    Cancer Father    Breast cancer Sister 47   Diabetes Brother     Objective: Office vital signs reviewed. BP 137/76   Pulse 73   Temp 98.2 F (36.8 C)   Ht $R'5\' 2"'ot$  (1.575 m)   Wt 121 lb (54.9 kg)   SpO2 98%   BMI 22.13 kg/m   Physical Examination:  General: Awake, alert, well nourished, No acute distress HEENT: Sclera white.  Moist  mucous membranes Cardio: regular rate and rhythm, S1S2 heard, no murmurs appreciated Pulm: clear to auscultation bilaterally, no wheezes, rhonchi or rales; normal work of breathing on room air  Assessment/ Plan: 66 y.o. female   Factor V Leiden mutation ([place]) - Plan: CoaguChek XS/INR Waived, Hemoglobin, fingerstick  Warfarin anticoagulation - Plan: CoaguChek XS/INR Waived, Hemoglobin, fingerstick  INR therapeutic at 2.1.  No changes.  May follow-up in 8 weeks.  No evidence of anemia with fingerstick hemoglobin  Orders Placed This Encounter  Procedures   CoaguChek XS/INR Waived   Hemoglobin, fingerstick   No orders of the defined types were placed in this encounter.    [name], DO [place] [phone]

## 2021-10-25 ENCOUNTER — Ambulatory Visit: Payer: 59 | Admitting: Podiatry

## 2021-11-01 ENCOUNTER — Ambulatory Visit: Payer: 59 | Admitting: Podiatry

## 2021-11-09 ENCOUNTER — Ambulatory Visit: Payer: 59 | Admitting: Podiatry

## 2021-11-26 ENCOUNTER — Other Ambulatory Visit: Payer: Self-pay | Admitting: Family Medicine

## 2021-12-10 ENCOUNTER — Other Ambulatory Visit: Payer: Self-pay | Admitting: Family Medicine

## 2021-12-10 DIAGNOSIS — D6851 Activated protein C resistance: Secondary | ICD-10-CM

## 2021-12-12 ENCOUNTER — Ambulatory Visit: Payer: 59 | Admitting: Family Medicine

## 2021-12-22 ENCOUNTER — Encounter: Payer: Self-pay | Admitting: Family Medicine

## 2021-12-22 ENCOUNTER — Ambulatory Visit: Payer: 59 | Admitting: Family Medicine

## 2021-12-22 VITALS — BP 127/72 | HR 73 | Temp 97.9°F | Ht 62.0 in | Wt 128.8 lb

## 2021-12-22 DIAGNOSIS — D6851 Activated protein C resistance: Secondary | ICD-10-CM

## 2021-12-22 DIAGNOSIS — Z7901 Long term (current) use of anticoagulants: Secondary | ICD-10-CM

## 2021-12-22 DIAGNOSIS — R791 Abnormal coagulation profile: Secondary | ICD-10-CM | POA: Diagnosis not present

## 2021-12-22 LAB — COAGUCHEK XS/INR WAIVED
INR: 5.6 (ref 0.9–1.1)
Prothrombin Time: 67.6 s

## 2021-12-22 NOTE — Progress Notes (Signed)
Subjective: CC:INR check PCP: Emily Norlander, DO HPI:[name] is a 66 y.o. female presenting to clinic today for:  1.  Factor V Leiden mutation Goal INR 2-3 Last INR was therapeutic at 2.1.  She is here for 69-monthfollow-up.  She is compliant with 15 mg 2 days/week and 10 mg the other days.  She denies any abnormal vaginal bleeding or rectal bleeding.  She sustained a bruise to her leg at the gym but otherwise no abnormalities.  No recent antibiotic use.  No NSAID use.  Using some Tylenol intermittently for some low back strain  ROS: Per HPI  Allergies  Allergen Reactions   Codeine    Donnatal [Belladonna Alk-Phenobarb Er]    Librium    Past Medical History:  Diagnosis Date   Anemia     Current Outpatient Medications:    ESTRACE VAGINAL 0.1 MG/GM vaginal cream, Apply vaginally as directed, Disp: , Rfl:    fluticasone (FLONASE) 50 MCG/ACT nasal spray, Place 2 sprays into both nostrils daily., Disp: 16 g, Rfl: 6   hydrochlorothiazide (MICROZIDE) 12.5 MG capsule, TAKE ONE (1) CAPSULE EACH DAY, Disp: 90 capsule, Rfl: 0   ibandronate (BONIVA) 150 MG tablet, Take 150 mg by mouth every 30 (thirty) days. Take in the morning with a full glass of water, on an empty stomach, and do not take anything else by mouth or lie down for the next 30 min., Disp: , Rfl:    Ketoprofen 10 % CREA, Apply to affected areas 1-2 times daily as needed, Disp: 60 g, Rfl: prn   linaclotide (LINZESS) 145 MCG CAPS capsule, Take 1 capsule (145 mcg total) by mouth daily before breakfast., Disp: 30 capsule, Rfl: 12   loratadine (CLARITIN) 10 MG tablet, Take 1 tablet (10 mg total) by mouth daily., Disp: 30 tablet, Rfl: 1   warfarin (COUMADIN) 5 MG tablet, TAKE 15MG ON TUESDAY & THURSDAY TAKE 10MG ALL OTHER DAYS, Disp: 180 tablet, Rfl: 0 Social History   Socioeconomic History   Marital status: Married    Spouse name: Not on file   Number of children: Not on file   Years of education: Not on file    Highest education level: Not on file  Occupational History   Not on file  Tobacco Use   Smoking status: Never   Smokeless tobacco: Never  Vaping Use   Vaping Use: Never used  Substance and Sexual Activity   Alcohol use: Not Currently    Alcohol/week: 0.0 standard drinks of alcohol   Drug use: Not Currently   Sexual activity: Not on file  Other Topics Concern   Not on file  Social History Narrative   Not on file   Social Determinants of Health   Financial Resource Strain: Not on file  Food Insecurity: Not on file  Transportation Needs: Not on file  Physical Activity: Not on file  Stress: Not on file  Social Connections: Not on file  Intimate Partner Violence: Not on file   Family History  Problem Relation Age of Onset   Stroke Mother    Cancer Father    Breast cancer Sister 44  Diabetes Brother     Objective: Office vital signs reviewed. BP 127/72   Pulse 73   Temp 97.9 F (36.6 C)   Ht _0  (1.575 m)   Wt 128 lb 12.8 oz (58.4 kg)   SpO2 98%   BMI 23.56 kg/m   Physical Examination:  General: Awake, alert, well  nourished, No acute distress Left knee: Ecchymosis noted  Assessment/ Plan: 66 y.o. female   Supratherapeutic INR  Factor V Leiden mutation ([name]) - Plan: CoaguChek XS/INR Waived  Warfarin anticoagulation - Plan: CoaguChek XS/INR Waived  INR supratherapeutic at 5.6 today.  Uncertain etiology as patient has had a very consistent INR up until this point.  She is had no changes in diet nor any recent change in medications.  Advised her to skip today and tomorrow's dose and then resume only 10 mg daily.  We will plan to recheck her next week or 2 and her appointment has been scheduled  Orders Placed This Encounter  Procedures   CoaguChek XS/INR Waived   No orders of the defined types were placed in this encounter.    Emily Norlander, DO [place] [phone]

## 2022-01-03 ENCOUNTER — Encounter (HOSPITAL_COMMUNITY): Payer: Self-pay | Admitting: Emergency Medicine

## 2022-01-03 ENCOUNTER — Emergency Department (HOSPITAL_COMMUNITY): Payer: 59

## 2022-01-03 ENCOUNTER — Other Ambulatory Visit: Payer: Self-pay

## 2022-01-03 ENCOUNTER — Emergency Department (HOSPITAL_COMMUNITY)
Admission: EM | Admit: 2022-01-03 | Discharge: 2022-01-03 | Disposition: A | Discharge status: Home / Self Care | Payer: 59 | Attending: Emergency Medicine | Admitting: Emergency Medicine

## 2022-01-03 DIAGNOSIS — S0990XA Unspecified injury of head, initial encounter: Secondary | ICD-10-CM

## 2022-01-03 DIAGNOSIS — Z7901 Long term (current) use of anticoagulants: Secondary | ICD-10-CM | POA: Insufficient documentation

## 2022-01-03 DIAGNOSIS — S0083XA Contusion of other part of head, initial encounter: Secondary | ICD-10-CM | POA: Insufficient documentation

## 2022-01-03 DIAGNOSIS — S0003XA Contusion of scalp, initial encounter: Secondary | ICD-10-CM | POA: Diagnosis not present

## 2022-01-03 DIAGNOSIS — W2203XA Walked into furniture, initial encounter: Secondary | ICD-10-CM | POA: Diagnosis not present

## 2022-01-03 DIAGNOSIS — I10 Essential (primary) hypertension: Secondary | ICD-10-CM | POA: Insufficient documentation

## 2022-01-03 LAB — PROTIME-INR
INR: 2.3 — ABNORMAL HIGH (ref 0.8–1.2)
Prothrombin Time: 25.5 seconds — ABNORMAL HIGH (ref 11.4–15.2)

## 2022-01-03 NOTE — ED Triage Notes (Signed)
Pt fell striking head on bed post. She denies any loc, but takes coumadin. Pt states she may have had too much wine causing fall.

## 2022-01-03 NOTE — ED Notes (Signed)
ED Provider at bedside. 

## 2022-01-03 NOTE — ED Provider Notes (Signed)
[facility] EMERGENCY DEPARTMENT Provider Note   CSN: [id number] Arrival date & time: 01/03/22  2137     History  Chief Complaint  Patient presents with   Head Injury    [name] is a 66 y.o. female.  Pt is a 66 yo female with a pmhx significant for Factor V Leiden Mutation on Coumadin, CVA, anemia, and HTN.  She admits to drinking too much wine tonight.  She was trying to crawl into bed and hit her head on the bed post.  She denies loc.  She did not hurt anything else.  She was able to ambulate after she hit her head.       Home Medications Prior to Admission medications   Medication Sig Start Date End Date Taking? Authorizing Provider  ESTRACE VAGINAL 0.1 MG/GM vaginal cream Apply vaginally as directed Patient not taking: Reported on 12/22/2021 07/25/12   [provider]  fluticasone (FLONASE) 50 MCG/ACT nasal spray Place 2 sprays into both nostrils daily. Patient not taking: Reported on 12/22/2021 03/28/21   Chevis Pretty, FNP  hydrochlorothiazide (MICROZIDE) 12.5 MG capsule TAKE ONE (1) CAPSULE EACH DAY 11/27/21   Gottschalk, Ashly M, DO  ibandronate (BONIVA) 150 MG tablet Take 150 mg by mouth every 30 (thirty) days. Take in the morning with a full glass of water, on an empty stomach, and do not take anything else by mouth or lie down for the next 30 min.    [provider]  Ketoprofen 10 % CREA Apply to affected areas 1-2 times daily as needed Patient not taking: Reported on 12/22/2021 10/09/21   [name], DO  linaclotide [facility]) 145 MCG CAPS capsule Take 1 capsule (145 mcg total) by mouth daily before breakfast. Patient not taking: Reported on 12/22/2021 08/04/21   [name], DO  loratadine (CLARITIN) 10 MG tablet Take 1 tablet (10 mg total) by mouth daily. 10/28/12   Hassell Done Mary-Margaret, FNP  warfarin (COUMADIN) 5 MG tablet TAKE 15MG ON TUESDAY & THURSDAY TAKE 10MG ALL OTHER DAYS 12/11/21   [name], DO      Allergies     Codeine, Donnatal [belladonna alk-phenobarb er], and Librium    Review of Systems   Review of Systems  HENT:         Head contusion  All other systems reviewed and are negative.   Physical Exam Updated Vital Signs BP (!) 171/95   Pulse 73   Temp 98.2 F (36.8 C)   Resp 16   Ht 5' 3" (1.6 m)   Wt 57.6 kg   SpO2 98%   BMI 22.50 kg/m  Physical Exam Vitals and nursing note reviewed.  Constitutional:      Appearance: Normal appearance.  HENT:     Head: Normocephalic.      Right Ear: External ear normal.     Left Ear: External ear normal.     Nose: Nose normal.     Mouth/Throat:     Mouth: Mucous membranes are moist.     Pharynx: Oropharynx is clear.  Eyes:     Extraocular Movements: Extraocular movements intact.     Conjunctiva/sclera: Conjunctivae normal.     Pupils: Pupils are equal, round, and reactive to light.  Cardiovascular:     Rate and Rhythm: Normal rate and regular rhythm.     Pulses: Normal pulses.     Heart sounds: Normal heart sounds.  Pulmonary:     Effort: Pulmonary effort is normal.  Breath sounds: Normal breath sounds.  Abdominal:     General: Abdomen is flat. Bowel sounds are normal.     Palpations: Abdomen is soft.  Musculoskeletal:        General: Normal range of motion.     Cervical back: Normal range of motion and neck supple.  Skin:    General: Skin is warm.     Capillary Refill: Capillary refill takes less than 2 seconds.  Neurological:     General: No focal deficit present.     Mental Status: She is alert and oriented to person, place, and time.  Psychiatric:        Mood and Affect: Mood normal.        Behavior: Behavior normal.     ED Results / Procedures / Treatments   Labs (all labs ordered are listed, but only abnormal results are displayed) Labs Reviewed  PROTIME-INR - Abnormal; Notable for the following components:      Result Value   Prothrombin Time 25.5 (*)    INR 2.3 (*)    All other components within normal  limits    EKG None  Radiology CT Head Wo Contrast  Result Date: 01/03/2022 CLINICAL DATA:  Head trauma, fall. EXAM: CT HEAD WITHOUT CONTRAST TECHNIQUE: Contiguous axial images were obtained from the base of the skull through the vertex without intravenous contrast. RADIATION DOSE REDUCTION: This exam was performed according to the departmental dose-optimization program which includes automated exposure control, adjustment of the mA and/or kV according to patient size and/or use of iterative reconstruction technique. COMPARISON:  None Available. FINDINGS: Brain: No acute intracranial hemorrhage, midline shift or mass effect. No extra-axial fluid collection. Generalized atrophy is noted. Gray-white matter differentiation is within normal limits. No hydrocephalus. Vascular: No hyperdense vessel or unexpected calcification. Skull: Normal. Negative for fracture or focal lesion. Sinuses/Orbits: No acute finding. Other: A scalp hematoma is noted over the frontal bone on the right. IMPRESSION: 1. No acute intracranial process. 2. Scalp hematoma over the frontal bone on the right. Electronically Signed   By: Brett Fairy M.D.   On: 01/03/2022 22:36    Procedures Procedures    Medications Ordered in ED Medications - No data to display  ED Course/ Medical Decision Making/ A&P                           Medical Decision Making  This patient presents to the ED for concern of head injury, this involves an extensive number of treatment options, and is a complaint that carries with it a high risk of complications and morbidity.  The differential diagnosis includes head bleed, fx   Co morbidities that complicate the patient evaluation  Factor V Leiden Mutation on Coumadin, CVA, anemia, and HTN   Additional history obtained:  Additional history obtained from epic chart review External records from outside source obtained and reviewed including husband   Lab Tests:  I Ordered, and personally  interpreted labs.  The pertinent results include:  INR 2.3   Imaging Studies ordered:  I ordered imaging studies including ct head  I independently visualized and interpreted imaging which showed  IMPRESSION:  1. No acute intracranial process.  2. Scalp hematoma over the frontal bone on the right.   I agree with the radiologist interpretation   Cardiac Monitoring:  The patient was maintained on a cardiac monitor.  I personally viewed and interpreted the cardiac monitored which showed an underlying rhythm of:  nsr   Medicines ordered and prescription drug management:  I have reviewed the patients home medicines and have made adjustments as needed   Test Considered:  ct   Critical Interventions:  ct   Problem List / ED Course:  Scalp hematoma/anticoagulated on coumadin:  ct neg.  Pt is neurologically intact.  Pt is stable for d/c.  Return if worse.  F/u with pcp.   Reevaluation:  After the interventions noted above, I reevaluated the patient and found that they have :improved   Social Determinants of Health:  Lives at home with husband   Dispostion:  After consideration of the diagnostic results and the patients response to treatment, I feel that the patent would benefit from discharge with outpatient f/u.          Final Clinical Impression(s) / ED Diagnoses Final diagnoses:  Anticoagulated on Coumadin  Injury of head, initial encounter    Rx / DC Orders ED Discharge Orders     None         Isla Pence, MD 01/03/22 2321

## 2022-01-03 NOTE — ED Notes (Signed)
Patient transported to CT 

## 2022-01-05 ENCOUNTER — Ambulatory Visit: Payer: 59 | Admitting: Family Medicine

## 2022-02-14 ENCOUNTER — Ambulatory Visit: Payer: 59 | Admitting: Family Medicine

## 2022-02-14 ENCOUNTER — Encounter: Payer: Self-pay | Admitting: Family Medicine

## 2022-02-14 ENCOUNTER — Telehealth: Payer: Self-pay | Admitting: Family Medicine

## 2022-02-14 VITALS — BP 147/83 | HR 71 | Temp 97.4°F | Ht 63.0 in | Wt 130.2 lb

## 2022-02-14 DIAGNOSIS — D6851 Activated protein C resistance: Secondary | ICD-10-CM

## 2022-02-14 DIAGNOSIS — Z7901 Long term (current) use of anticoagulants: Secondary | ICD-10-CM | POA: Diagnosis not present

## 2022-02-14 DIAGNOSIS — Z23 Encounter for immunization: Secondary | ICD-10-CM

## 2022-02-14 DIAGNOSIS — R791 Abnormal coagulation profile: Secondary | ICD-10-CM

## 2022-02-14 LAB — COAGUCHEK XS/INR WAIVED
INR: 4.5 — ABNORMAL HIGH (ref 0.9–1.1)
Prothrombin Time: 54.1 s

## 2022-02-14 LAB — POCT INR: INR: 4.5 — AB (ref 2–3)

## 2022-02-14 NOTE — Telephone Encounter (Signed)
Called and scheduled

## 2022-02-14 NOTE — Progress Notes (Signed)
Subjective: CC:f/u Supratherapeutic INR PCP: [name], DO HPI:[name] is a 66 y.o. female presenting to clinic today for:  1. Factor V leiden mutation Patient was seen 6.5 weeks ago in the ER and her INR was noted to be normal at 2.3.  The time prior she was supratherapeutic.  She did not remember that she was only supposed to take 10 mg daily so she has been taking 10 mg daily except for Thursdays she takes 15 mg.  Denies any bleeding, easy bruising.  ROS: Per HPI  Allergies  Allergen Reactions   Codeine    Donnatal [Belladonna Alk-Phenobarb Er]    Librium    Past Medical History:  Diagnosis Date   Anemia     Current Outpatient Medications:    ESTRACE VAGINAL 0.1 MG/GM vaginal cream, Apply vaginally as directed (Patient not taking: Reported on 12/22/2021), Disp: , Rfl:    fluticasone (FLONASE) 50 MCG/ACT nasal spray, Place 2 sprays into both nostrils daily. (Patient not taking: Reported on 12/22/2021), Disp: 16 g, Rfl: 6   hydrochlorothiazide (MICROZIDE) 12.5 MG capsule, TAKE ONE (1) CAPSULE EACH DAY, Disp: 90 capsule, Rfl: 0   ibandronate (BONIVA) 150 MG tablet, Take 150 mg by mouth every 30 (thirty) days. Take in the morning with a full glass of water, on an empty stomach, and do not take anything else by mouth or lie down for the next 30 min., Disp: , Rfl:    Ketoprofen 10 % CREA, Apply to affected areas 1-2 times daily as needed (Patient not taking: Reported on 12/22/2021), Disp: 60 g, Rfl: prn   linaclotide (LINZESS) 145 MCG CAPS capsule, Take 1 capsule (145 mcg total) by mouth daily before breakfast. (Patient not taking: Reported on 12/22/2021), Disp: 30 capsule, Rfl: 12   loratadine (CLARITIN) 10 MG tablet, Take 1 tablet (10 mg total) by mouth daily., Disp: 30 tablet, Rfl: 1   warfarin (COUMADIN) 5 MG tablet, TAKE $RemoveBefo'15MG'jiFxQfpmSsN$  ON TUESDAY & THURSDAY TAKE $RemoveBefor'10MG'DxJrtLeCEiGn$  ALL OTHER DAYS, Disp: 180 tablet, Rfl: 0 Social History   Socioeconomic History   Marital status: Married     Spouse name: Not on file   Number of children: Not on file   Years of education: Not on file   Highest education level: Not on file  Occupational History   Not on file  Tobacco Use   Smoking status: Never   Smokeless tobacco: Never  Vaping Use   Vaping Use: Never used  Substance and Sexual Activity   Alcohol use: Not Currently    Alcohol/week: 0.0 standard drinks of alcohol   Drug use: Not Currently   Sexual activity: Not on file  Other Topics Concern   Not on file  Social History Narrative   Not on file   Social Determinants of Health   Financial Resource Strain: Not on file  Food Insecurity: Not on file  Transportation Needs: Not on file  Physical Activity: Not on file  Stress: Not on file  Social Connections: Not on file  Intimate Partner Violence: Not on file   Family History  Problem Relation Age of Onset   Stroke Mother    Cancer Father    Breast cancer Sister 25   Diabetes Brother     Objective: Office vital signs reviewed. BP (!) 147/83   Pulse 71   Temp (!) 97.4 F (36.3 C)   Ht $R'5\' 3"'bS$  (1.6 m)   Wt 130 lb 3.2 oz (59.1 kg)   SpO2 97%  BMI 23.06 kg/m   Physical Examination:  General: Awake, alert, well nourished, No acute distress Cardio: RRR Pulm; normal WOB on room air, no wheezing Skin: dry; intact; no ecchymosis/ petechiae   Assessment/ Plan: 66 y.o. female   Supratherapeutic INR  Factor V Leiden mutation ([place]) - Plan: CoaguChek XS/INR Waived, POCT INR  Warfarin anticoagulation - Plan: CoaguChek XS/INR Waived, POCT INR  Need for immunization against influenza - Plan: Flu Vaccine QUAD 42mo+IM (Fluarix, Fluzone & Alfiuria Quad PF), POCT INR  INR supratherapeutic at 4.5 today.  I reiterated the instructions reviewed last time and only want her taking 10 mg daily but she will skip today's dose given supratherapeutic INR.  Ideally I would have like to check her in the next 7 to 10 days but unfortunately she is going out of town starting next  week I would not return for a full month.  We discussed the risks of not checking this sooner and she voiced good understanding of red flag signs and symptoms to watch for.  We will reconvene in about a month and this will be scheduled after she has a chance to look at her schedule  Influenza vaccination administered  No orders of the defined types were placed in this encounter.  No orders of the defined types were placed in this encounter.    [name], DO [place] [phone]

## 2022-02-15 ENCOUNTER — Telehealth: Payer: Self-pay | Admitting: Family Medicine

## 2022-02-15 NOTE — Telephone Encounter (Signed)
Called and spoke with patient wants to wait for aylssa to come back.

## 2022-02-16 ENCOUNTER — Other Ambulatory Visit: Payer: Self-pay | Admitting: Family Medicine

## 2022-02-16 DIAGNOSIS — L309 Dermatitis, unspecified: Secondary | ICD-10-CM

## 2022-02-16 NOTE — Telephone Encounter (Signed)
PT CALLED AND SCHEDULED

## 2022-02-18 ENCOUNTER — Other Ambulatory Visit: Payer: Self-pay | Admitting: Family Medicine

## 2022-03-08 ENCOUNTER — Other Ambulatory Visit: Payer: Self-pay | Admitting: Family Medicine

## 2022-03-08 DIAGNOSIS — D6851 Activated protein C resistance: Secondary | ICD-10-CM

## 2022-03-09 ENCOUNTER — Encounter: Payer: Self-pay | Admitting: Family Medicine

## 2022-03-09 ENCOUNTER — Ambulatory Visit: Payer: 59 | Admitting: Family Medicine

## 2022-03-09 VITALS — BP 137/85 | HR 79 | Temp 97.2°F | Ht 63.0 in | Wt 132.8 lb

## 2022-03-09 DIAGNOSIS — D6851 Activated protein C resistance: Secondary | ICD-10-CM

## 2022-03-09 DIAGNOSIS — Z7901 Long term (current) use of anticoagulants: Secondary | ICD-10-CM | POA: Diagnosis not present

## 2022-03-09 LAB — COAGUCHEK XS/INR WAIVED
INR: 2.4 — ABNORMAL HIGH (ref 0.9–1.1)
Prothrombin Time: 28.6 s

## 2022-03-09 NOTE — Progress Notes (Signed)
Subjective: CC: INR check PCP: [name], DO HPI:[name] is a 66 y.o. female presenting to clinic today for:  1.  Factor V Leyden mutation Goal INR 2-3 Patient's INR was 4.5 last visit.  She was advised to skip that days dose then resume 10 mg daily only.  Ideally we wanted to see her back within 10 days but unfortunately she was going out of town so today was the earliest date that she could get rechecked.  She reports that she is been doing well.  She has stuck with 10 mg daily and has not had any concerning symptoms or signs.  Eating her greens as she normally does.  No bleeding episodes reported   ROS: Per HPI  Allergies  Allergen Reactions   Codeine    Donnatal [Belladonna Alk-Phenobarb Er]    Librium    Past Medical History:  Diagnosis Date   Anemia     Current Outpatient Medications:    ESTRACE VAGINAL 0.1 MG/GM vaginal cream, Apply vaginally as directed (Patient not taking: Reported on 12/22/2021), Disp: , Rfl:    fluticasone (FLONASE) 50 MCG/ACT nasal spray, Place 2 sprays into both nostrils daily. (Patient not taking: Reported on 12/22/2021), Disp: 16 g, Rfl: 6   hydrochlorothiazide (MICROZIDE) 12.5 MG capsule, TAKE ONE (1) CAPSULE EACH DAY, Disp: 90 capsule, Rfl: 0   ibandronate (BONIVA) 150 MG tablet, Take 150 mg by mouth every 30 (thirty) days. Take in the morning with a full glass of water, on an empty stomach, and do not take anything else by mouth or lie down for the next 30 min., Disp: , Rfl:    Ketoprofen 10 % CREA, Apply to affected areas 1-2 times daily as needed (Patient not taking: Reported on 12/22/2021), Disp: 60 g, Rfl: prn   linaclotide (LINZESS) 145 MCG CAPS capsule, Take 1 capsule (145 mcg total) by mouth daily before breakfast. (Patient not taking: Reported on 12/22/2021), Disp: 30 capsule, Rfl: 12   loratadine (CLARITIN) 10 MG tablet, Take 1 tablet (10 mg total) by mouth daily., Disp: 30 tablet, Rfl: 1   warfarin (COUMADIN) 5 MG tablet, TAKE  $Rem'15MG'dEUV$  ON TUESDAY & THURSDAY TAKE $RemoveBefor'10MG'fFsRDdIEbVtS$  ALL OTHER DAYS, Disp: 180 tablet, Rfl: 0 Social History   Socioeconomic History   Marital status: Married    Spouse name: Not on file   Number of children: Not on file   Years of education: Not on file   Highest education level: Not on file  Occupational History   Not on file  Tobacco Use   Smoking status: Never   Smokeless tobacco: Never  Vaping Use   Vaping Use: Never used  Substance and Sexual Activity   Alcohol use: Not Currently    Alcohol/week: 0.0 standard drinks of alcohol   Drug use: Not Currently   Sexual activity: Not on file  Other Topics Concern   Not on file  Social History Narrative   Not on file   Social Determinants of Health   Financial Resource Strain: Not on file  Food Insecurity: Not on file  Transportation Needs: Not on file  Physical Activity: Not on file  Stress: Not on file  Social Connections: Not on file  Intimate Partner Violence: Not on file   Family History  Problem Relation Age of Onset   Stroke Mother    Cancer Father    Breast cancer Sister 68   Diabetes Brother     Objective: Office vital signs reviewed. BP 137/85  Pulse 79   Temp (!) 97.2 F (36.2 C)   Ht $R'5\' 3"'Qf$  (1.6 m)   Wt 132 lb 12.8 oz (60.2 kg)   SpO2 98%   BMI 23.52 kg/m   Physical Examination:  General: Awake, alert, well nourished, No acute distress Cardio: regular rate and rhythm, S1S2 heard, no murmurs appreciated Pulm: clear to auscultation bilaterally, no wheezes, rhonchi or rales; normal work of breathing on room air    Assessment/ Plan: 66 y.o. female   Factor V Leiden mutation ([place]) - Plan: CoaguChek XS/INR Waived  Warfarin anticoagulation - Plan: CoaguChek XS/INR Waived  INR therapeutic at 2.4 today.  Continue 10 mg daily.  See her back in 8 weeks   No orders of the defined types were placed in this encounter.  No orders of the defined types were placed in this encounter.    [name],  DO [place] [phone]

## 2022-03-26 DIAGNOSIS — M79642 Pain in left hand: Secondary | ICD-10-CM | POA: Diagnosis not present

## 2022-03-26 DIAGNOSIS — M13831 Other specified arthritis, right wrist: Secondary | ICD-10-CM | POA: Diagnosis not present

## 2022-03-26 DIAGNOSIS — M720 Palmar fascial fibromatosis [Dupuytren]: Secondary | ICD-10-CM | POA: Diagnosis not present

## 2022-03-26 DIAGNOSIS — M13832 Other specified arthritis, left wrist: Secondary | ICD-10-CM | POA: Diagnosis not present

## 2022-03-26 DIAGNOSIS — M79641 Pain in right hand: Secondary | ICD-10-CM | POA: Diagnosis not present

## 2022-04-04 ENCOUNTER — Ambulatory Visit: Payer: 59 | Admitting: Family Medicine

## 2022-04-11 ENCOUNTER — Encounter: Payer: Self-pay | Admitting: Podiatry

## 2022-04-11 ENCOUNTER — Ambulatory Visit: Payer: Medicare Other | Admitting: Podiatry

## 2022-04-11 DIAGNOSIS — M76822 Posterior tibial tendinitis, left leg: Secondary | ICD-10-CM

## 2022-04-11 MED ORDER — TRIAMCINOLONE ACETONIDE 10 MG/ML IJ SUSP
10.0000 mg | Freq: Once | INTRAMUSCULAR | Status: AC
  Administered 2022-04-11: 10 mg

## 2022-04-14 NOTE — Progress Notes (Signed)
Subjective:   Patient ID: [name], female   DOB: 66 y.o.   MRN: [id number]   HPI Patient presents with inflammation around the posterior tibial tendon left at the insertion of the tendon calcaneus stating that when she walks it seems to get bad and that she needs to be on her foot for periods of time   ROS      Objective:  Physical Exam  Neurovascular status intact inflammation pain of the posterior tibial tendon as it inserts into the navicular with moderate depression of the arch moderately chronic in nature mild swelling     Assessment:  Continued inflammatory posterior tibial tendinitis left that the patient has continued to remain very active with and is still developing quite significant symptoms     Plan:  H&P reviewed.  I did explain immobilization to try to rest the tendon and that she is overusing it and at this point I did dispense an air fracture walker to completely immobilize and I did do 1 careful injection explaining chances for rupture which she understands 3 mg Dexasone Kenalog 5 mg Xylocaine.  I discussed if this does not improve we will need to get an MRI to rule out a tear of the posterior tibial tendon and it is possible that at 1 point surgical intervention may be necessary.  Reappoint to recheck 3 weeks

## 2022-04-18 DIAGNOSIS — Z6824 Body mass index (BMI) 24.0-24.9, adult: Secondary | ICD-10-CM | POA: Diagnosis not present

## 2022-04-18 DIAGNOSIS — Z1231 Encounter for screening mammogram for malignant neoplasm of breast: Secondary | ICD-10-CM | POA: Diagnosis not present

## 2022-04-18 DIAGNOSIS — M8588 Other specified disorders of bone density and structure, other site: Secondary | ICD-10-CM | POA: Diagnosis not present

## 2022-04-18 LAB — HM DEXA SCAN

## 2022-04-24 ENCOUNTER — Other Ambulatory Visit: Payer: Self-pay | Admitting: Family Medicine

## 2022-04-24 DIAGNOSIS — D6851 Activated protein C resistance: Secondary | ICD-10-CM

## 2022-05-02 ENCOUNTER — Encounter: Payer: Self-pay | Admitting: Podiatry

## 2022-05-02 ENCOUNTER — Ambulatory Visit: Payer: Medicare Other | Admitting: Podiatry

## 2022-05-02 DIAGNOSIS — M778 Other enthesopathies, not elsewhere classified: Secondary | ICD-10-CM | POA: Diagnosis not present

## 2022-05-02 DIAGNOSIS — M76822 Posterior tibial tendinitis, left leg: Secondary | ICD-10-CM

## 2022-05-02 NOTE — Progress Notes (Signed)
Subjective:   Patient ID: Tilden Fossa, female   DOB: 66 y.o.   MRN: [id number]   HPI Patient states that she seems to be improved with the boot but is not sure what will be like when she does not wear the boot   ROS      Objective:  Physical Exam  Neurovascular status intact with patient's left medial ankle improved there still is pain present but it is better than it was currently     Assessment:  Posterior tibial tendinitis left it has been improved with immobilization with still mild discomfort no indication of muscle strength loss     Plan:  Reviewed condition at great length educating her and her husband about why she is developing this and treatment options.  At this point we are going to start rehabilitation increased activity and I gave encouragement on gradual activities to do.  I went ahead and she will stop her immobilization we discussed orthotics which may be necessary long-term we will get a hold off and see how she responds conservatively.  Reappoint to recheck as needed all questions answered today over extensive period of time

## 2022-05-08 ENCOUNTER — Encounter: Payer: Self-pay | Admitting: Family Medicine

## 2022-05-08 ENCOUNTER — Ambulatory Visit (INDEPENDENT_AMBULATORY_CARE_PROVIDER_SITE_OTHER): Payer: Medicare Other | Admitting: Family Medicine

## 2022-05-08 VITALS — BP 148/82 | HR 72 | Temp 98.2°F | Ht 63.0 in | Wt 120.8 lb

## 2022-05-08 DIAGNOSIS — D6851 Activated protein C resistance: Secondary | ICD-10-CM | POA: Diagnosis not present

## 2022-05-08 DIAGNOSIS — H60543 Acute eczematoid otitis externa, bilateral: Secondary | ICD-10-CM

## 2022-05-08 DIAGNOSIS — R791 Abnormal coagulation profile: Secondary | ICD-10-CM | POA: Diagnosis not present

## 2022-05-08 DIAGNOSIS — Z7901 Long term (current) use of anticoagulants: Secondary | ICD-10-CM

## 2022-05-08 DIAGNOSIS — R0981 Nasal congestion: Secondary | ICD-10-CM

## 2022-05-08 DIAGNOSIS — Z23 Encounter for immunization: Secondary | ICD-10-CM | POA: Diagnosis not present

## 2022-05-08 LAB — COAGUCHEK XS/INR WAIVED
INR: 2.5 — ABNORMAL HIGH (ref 0.9–1.1)
Prothrombin Time: 30.5 s

## 2022-05-08 NOTE — Addendum Note (Signed)
Addended by: Fara Olden on: 05/08/2022 03:04 PM   Modules accepted: Orders

## 2022-05-08 NOTE — Progress Notes (Signed)
Subjective: CC:INR  PCP: [name], DO HPI:[name] is a 66 y.o. female presenting to clinic today for:  1.  Factor V Leyden mutation Goal INR 2-3 INR was therapeutic at last visit.  Here for 8-week follow-up.  Denies any abnormal bleeding.  Compliant with Coumadin.  No changes in diet or medications  2.  Ear itching Patient reports itching of bilateral ears, right greater than left.  Not utilizing anything for this.  Also reports dry sinuses at night.  She thinks this is due to heat so just added some humidification into the air a couple of days ago.  Uses Flonase at nighttime for nasal congestion but only does 1 spray in each nostril.  Sometimes has some nasal bleeding.   ROS: Per HPI  Allergies  Allergen Reactions   Codeine    Donnatal [Belladonna Alk-Phenobarb Er]    Librium    Past Medical History:  Diagnosis Date   Anemia     Current Outpatient Medications:    hydrochlorothiazide (MICROZIDE) 12.5 MG capsule, TAKE ONE (1) CAPSULE EACH DAY, Disp: 90 capsule, Rfl: 0   ibandronate (BONIVA) 150 MG tablet, Take 150 mg by mouth every 30 (thirty) days. Take in the morning with a full glass of water, on an empty stomach, and do not take anything else by mouth or lie down for the next 30 min., Disp: , Rfl:    loratadine (CLARITIN) 10 MG tablet, Take 1 tablet (10 mg total) by mouth daily., Disp: 30 tablet, Rfl: 1   warfarin (COUMADIN) 5 MG tablet, TAKE 15MG ON TUESDAY & THURSDAY TAKE 10MG ALL OTHER DAYS, Disp: 180 tablet, Rfl: 0   ESTRACE VAGINAL 0.1 MG/GM vaginal cream, Apply vaginally as directed (Patient not taking: Reported on 12/22/2021), Disp: , Rfl:    fluticasone (FLONASE) 50 MCG/ACT nasal spray, Place 2 sprays into both nostrils daily. (Patient not taking: Reported on 12/22/2021), Disp: 16 g, Rfl: 6   Ketoprofen 10 % CREA, Apply to affected areas 1-2 times daily as needed (Patient not taking: Reported on 12/22/2021), Disp: 60 g, Rfl: prn   linaclotide (LINZESS)  145 MCG CAPS capsule, Take 1 capsule (145 mcg total) by mouth daily before breakfast. (Patient not taking: Reported on 12/22/2021), Disp: 30 capsule, Rfl: 12 Social History   Socioeconomic History   Marital status: Married    Spouse name: Not on file   Number of children: Not on file   Years of education: Not on file   Highest education level: Not on file  Occupational History   Not on file  Tobacco Use   Smoking status: Never   Smokeless tobacco: Never  Vaping Use   Vaping Use: Never used  Substance and Sexual Activity   Alcohol use: Not Currently    Alcohol/week: 0.0 standard drinks of alcohol   Drug use: Not Currently   Sexual activity: Not on file  Other Topics Concern   Not on file  Social History Narrative   Not on file   Social Determinants of Health   Financial Resource Strain: Not on file  Food Insecurity: Not on file  Transportation Needs: Not on file  Physical Activity: Not on file  Stress: Not on file  Social Connections: Not on file  Intimate Partner Violence: Not on file   Family History  Problem Relation Age of Onset   Stroke Mother    Cancer Father    Breast cancer Sister 54   Diabetes Brother     Objective:  Office vital signs reviewed. BP (!) 155/84   Pulse 72   Temp 98.2 F (36.8 C)   Ht _0  (1.6 m)   Wt 120 lb 12.8 oz (54.8 kg)   SpO2 97%   BMI 21.40 kg/m   Physical Examination:  General: Awake, alert, well nourished, No acute distress HEENT:      Ears: Tympanic membranes intact on left.  Right TM obscured by dried cerumen    Eyes: PERRLA, extraocular membranes intact, sclera white    Nose: nasal turbinates moist, clear nasal discharge    Throat: moist mucus membranes Cardio: regular rate and rhythm, S1S2 heard, no murmurs appreciated Pulm: clear to auscultation bilaterally, no wheezes, rhonchi or rales; normal work of breathing on room air    Assessment/ Plan: 66 y.o. female   Factor V Leiden mutation ([place]) - Plan: CoaguChek  XS/INR Waived  Warfarin anticoagulation - Plan: CoaguChek XS/INR Waived  Dermatitis of ear canal, bilateral  Need for pneumococcal vaccination  Nasal congestion  INR therapeutic at 2.5 today.  No changes.  May follow-up in 8 weeks, sooner if concerns arise  Discussed treatment of dermatitis, initially with Debrox for cerumen impaction on right.  Then may proceed with use of oil/sweet oil or cortisone cream applied up to twice daily as needed itching.  Discussed use of humidification, nasal saline spray in addition to Flonase.  Discussed that Flonase can in fact cause some nasal bleeding and she may consider switching off to something like Nasacort or Nasonex if symptoms or not improved by saline and humidification.  Orders Placed This Encounter  Procedures   CoaguChek XS/INR Waived   No orders of the defined types were placed in this encounter.    [name], DO [place] [phone]

## 2022-05-08 NOTE — Patient Instructions (Signed)
2 sprays of Flonase each nostril, nasal saline spray after. Use humidifier If no improvement in the bloody nose, change flonase to Nasacort (generic is fine, same directions as flonase)

## 2022-05-15 ENCOUNTER — Ambulatory Visit (INDEPENDENT_AMBULATORY_CARE_PROVIDER_SITE_OTHER): Payer: Medicare Other | Admitting: Family

## 2022-05-15 ENCOUNTER — Encounter: Payer: Self-pay | Admitting: Family

## 2022-05-15 VITALS — BP 157/81 | HR 92 | Temp 97.4°F | Ht 63.0 in | Wt 130.0 lb

## 2022-05-15 DIAGNOSIS — J019 Acute sinusitis, unspecified: Secondary | ICD-10-CM | POA: Diagnosis not present

## 2022-05-15 DIAGNOSIS — H6121 Impacted cerumen, right ear: Secondary | ICD-10-CM

## 2022-05-15 MED ORDER — CETIRIZINE HCL 10 MG PO TABS: 10.0000 mg | ORAL_TABLET | Freq: Every day | ORAL | 1 refills | Status: DC

## 2022-05-15 MED ORDER — AMOXICILLIN-POT CLAVULANATE 875-125 MG PO TABS: 1.0000 | ORAL_TABLET | Freq: Two times a day (BID) | ORAL | 0 refills | Status: DC

## 2022-05-15 NOTE — Progress Notes (Signed)
Subjective:    Patient ID: [name], female    DOB: November 14, 1955, 66 y.o.   MRN: [id number]  Chief Complaint  Patient presents with   [facility]    Dr g looked at them last week    Sinus Problem    Sinus Problem This is a new problem. The current episode started 1 to 4 weeks ago. The problem is unchanged. There has been no fever. Her pain is at a severity of 2/10. The pain is mild. Associated symptoms include congestion, ear pain, sinus pressure and sneezing. Pertinent negatives include no chills, coughing, headaches, shortness of breath or sore throat. (Ear congestion) Treatments tried: flonase, claritin. The treatment provided mild relief.      Review of Systems  Constitutional:  Negative for chills.  HENT:  Positive for congestion, ear pain, sinus pressure and sneezing. Negative for sore throat.   Respiratory:  Negative for cough and shortness of breath.   Neurological:  Negative for headaches.  All other systems reviewed and are negative.      Objective:   Physical Exam Vitals reviewed.  Constitutional:      General: She is not in acute distress.    Appearance: She is well-developed.  HENT:     Head: Normocephalic and atraumatic.     Right Ear: There is impacted cerumen.  Eyes:     Pupils: Pupils are equal, round, and reactive to light.  Neck:     Thyroid: No thyromegaly.  Cardiovascular:     Rate and Rhythm: Normal rate and regular rhythm.     Heart sounds: Normal heart sounds. No murmur heard. Pulmonary:     Effort: Pulmonary effort is normal. No respiratory distress.     Breath sounds: Normal breath sounds. No wheezing.  Abdominal:     General: Bowel sounds are normal. There is no distension.     Palpations: Abdomen is soft.     Tenderness: There is no abdominal tenderness.  Musculoskeletal:        General: No tenderness. Normal range of motion.     Cervical back: Normal range of motion and neck supple.  Skin:    General: Skin is warm and dry.   Neurological:     Mental Status: She is alert and oriented to person, place, and time.     Cranial Nerves: No cranial nerve deficit.     Deep Tendon Reflexes: Reflexes are normal and symmetric.  Psychiatric:        Behavior: Behavior normal.        Thought Content: Thought content normal.        Judgment: Judgment normal.    Right ear washed with warm water and peroxide. TM WNL. Pt tolerated well.    BP (!) 157/81   Pulse 92   Temp (!) 97.4 F (36.3 C) (Temporal)   Ht 5\' 3"  (1.6 m)   Wt 130 lb (59 kg)   BMI 23.03 kg/m      Assessment & Plan:  TONEY LIZAOLA comes in today with chief complaint of EARS ITCHING (Dr g looked at them last week ) and Sinus Problem   Diagnosis and orders addressed:  1. Acute sinusitis, recurrence not specified, unspecified location Stop Claritin and start Zyrtec Continue Flonase If pain and pressure do not improve, start Augmentin in next few days - Take meds as prescribed - Use a cool mist humidifier  -Use saline nose sprays frequently -Force fluids -For any cough or congestion  Use  plain Mucinex- regular strength or max strength is fine -For fever or aces or pains- take tylenol or ibuprofen. -Throat lozenges if help Follow up if symptoms worsen or do not improve  - amoxicillin-clavulanate (AUGMENTIN) 875-125 MG tablet; Take 1 tablet by mouth 2 (two) times daily.  Dispense: 14 tablet; Refill: 0  2. Right ear impacted cerumen TM WNL - cetirizine (ZYRTEC ALLERGY) 10 MG tablet; Take 1 tablet (10 mg total) by mouth daily.  Dispense: 90 tablet; Refill: 1   Jannifer Rodney, FNP

## 2022-05-15 NOTE — Patient Instructions (Signed)

## 2022-05-28 ENCOUNTER — Other Ambulatory Visit: Payer: Self-pay | Admitting: Family Medicine

## 2022-05-28 ENCOUNTER — Other Ambulatory Visit: Payer: Self-pay | Admitting: Nurse Practitioner

## 2022-06-19 ENCOUNTER — Telehealth: Payer: Self-pay | Admitting: Family Medicine

## 2022-06-19 NOTE — Telephone Encounter (Signed)
Pt has been notified.

## 2022-06-19 NOTE — Telephone Encounter (Signed)
Called pt back and she is concerned if she can mix vital protein in with her coffee

## 2022-06-19 NOTE — Telephone Encounter (Signed)
I think this should be fine

## 2022-06-19 NOTE — Telephone Encounter (Signed)
Pt called requesting to speak directly with PCP or nurse. Has questions.

## 2022-06-20 ENCOUNTER — Telehealth (INDEPENDENT_AMBULATORY_CARE_PROVIDER_SITE_OTHER): Payer: Medicare Other | Admitting: Family Medicine

## 2022-06-20 ENCOUNTER — Encounter: Payer: Self-pay | Admitting: Family Medicine

## 2022-06-20 DIAGNOSIS — R300 Dysuria: Secondary | ICD-10-CM

## 2022-06-20 DIAGNOSIS — R3989 Other symptoms and signs involving the genitourinary system: Secondary | ICD-10-CM | POA: Diagnosis not present

## 2022-06-20 DIAGNOSIS — R350 Frequency of micturition: Secondary | ICD-10-CM

## 2022-06-20 MED ORDER — CEPHALEXIN 500 MG PO CAPS: 500.0000 mg | ORAL_CAPSULE | Freq: Two times a day (BID) | ORAL | 0 refills | Status: DC

## 2022-06-20 NOTE — Progress Notes (Signed)
Virtual Visit via telephone Note Due to COVID-19 pandemic this visit was conducted virtually. This visit type was conducted due to national recommendations for restrictions regarding the COVID-19 Pandemic (e.g. social distancing, sheltering in place) in an effort to limit this patient's exposure and mitigate transmission in our community. All issues noted in this document were discussed and addressed.  A physical exam was not performed with this format.   I connected with [name] on 06/20/2022 at 67 by telephone and verified that I am speaking with the correct person using two identifiers. [name] is currently located at home and patient is currently with them during visit. The provider, Monia Pouch, FNP is located in their office at time of visit.  I discussed the limitations, risks, security and privacy concerns of performing an evaluation and management service by virtual visit and the availability of in person appointments. I also discussed with the patient that there may be a patient responsible charge related to this service. The patient expressed understanding and agreed to proceed.  Subjective:  Patient ID: [name], female    DOB: May 26, 1955, 67 y.o.   MRN: [id number]  Chief Complaint:  Dysuria   HPI: [name] is a 67 y.o. female presenting on 06/20/2022 for Dysuria   Dysuria  This is a new problem. The current episode started yesterday. The problem occurs every urination. The problem has been gradually worsening. The quality of the pain is described as aching and burning. The pain is mild. There has been no fever. She is Not sexually active. There is No history of pyelonephritis. Associated symptoms include frequency and urgency. Pertinent negatives include no chills, discharge, flank pain, hematuria, hesitancy, nausea, possible pregnancy, sweats or vomiting. She has tried increased fluids for the symptoms. The treatment provided no relief.     Relevant past  medical, surgical, family, and social history reviewed and updated as indicated.  Allergies and medications reviewed and updated.   Past Medical History:  Diagnosis Date   Anemia     History reviewed. No pertinent surgical history.  Social History   Socioeconomic History   Marital status: Married    Spouse name: Not on file   Number of children: Not on file   Years of education: Not on file   Highest education level: Not on file  Occupational History   Not on file  Tobacco Use   Smoking status: Never   Smokeless tobacco: Never  Vaping Use   Vaping Use: Never used  Substance and Sexual Activity   Alcohol use: Not Currently    Alcohol/week: 0.0 standard drinks of alcohol   Drug use: Not Currently   Sexual activity: Not on file  Other Topics Concern   Not on file  Social History Narrative   Not on file   Social Determinants of Health   Financial Resource Strain: Not on file  Food Insecurity: Not on file  Transportation Needs: Not on file  Physical Activity: Not on file  Stress: Not on file  Social Connections: Not on file  Intimate Partner Violence: Not on file    Outpatient Encounter Medications as of 06/20/2022  Medication Sig   cephALEXin (KEFLEX) 500 MG capsule Take 1 capsule (500 mg total) by mouth 2 (two) times daily for 7 days.   cetirizine (ZYRTEC ALLERGY) 10 MG tablet Take 1 tablet (10 mg total) by mouth daily.   fluticasone (FLONASE) 50 MCG/ACT nasal spray USE 2 SPRAYS IN EACH NOSTRIL DAILY  hydrochlorothiazide (MICROZIDE) 12.5 MG capsule TAKE ONE (1) CAPSULE EACH DAY   ibandronate (BONIVA) 150 MG tablet Take 150 mg by mouth every 30 (thirty) days. Take in the morning with a full glass of water, on an empty stomach, and do not take anything else by mouth or lie down for the next 30 min.   warfarin (COUMADIN) 5 MG tablet TAKE 15MG  ON TUESDAY & THURSDAY TAKE 10MG  ALL OTHER DAYS   [DISCONTINUED] amoxicillin-clavulanate (AUGMENTIN) 875-125 MG tablet Take 1  tablet by mouth 2 (two) times daily.   [DISCONTINUED] ESTRACE VAGINAL 0.1 MG/GM vaginal cream Apply vaginally as directed (Patient not taking: Reported on 12/22/2021)   [DISCONTINUED] Ketoprofen 10 % CREA Apply to affected areas 1-2 times daily as needed (Patient not taking: Reported on 12/22/2021)   [DISCONTINUED] linaclotide (LINZESS) 145 MCG CAPS capsule Take 1 capsule (145 mcg total) by mouth daily before breakfast. (Patient not taking: Reported on 12/22/2021)   No facility-administered encounter medications on file as of 06/20/2022.    Allergies  Allergen Reactions   Codeine    Donnatal [Belladonna Alk-Phenobarb Er]    Librium     Review of Systems  Constitutional:  Negative for activity change, appetite change, chills, diaphoresis, fatigue, fever and unexpected weight change.  Eyes:  Negative for photophobia and visual disturbance.  Respiratory:  Negative for cough and shortness of breath.   Cardiovascular:  Negative for chest pain, palpitations and leg swelling.  Gastrointestinal:  Negative for abdominal pain, nausea and vomiting.  Endocrine: Negative for polydipsia, polyphagia and polyuria.  Genitourinary:  Positive for dysuria, frequency and urgency. Negative for decreased urine volume, difficulty urinating, dyspareunia, enuresis, flank pain, genital sores, hematuria, hesitancy, pelvic pain, vaginal bleeding, vaginal discharge and vaginal pain.  Musculoskeletal:  Negative for back pain.  Neurological:  Negative for weakness.  Psychiatric/Behavioral:  Negative for confusion.   All other systems reviewed and are negative.        Observations/Objective: No vital signs or physical exam, this was a virtual health encounter.  Pt alert and oriented, answers all questions appropriately, and able to speak in full sentences.    Assessment and Plan: [name] was seen today for dysuria.  Diagnoses and all orders for this visit:  Dysuria Frequency of urination Suspected UTI Symptoms  consistent with UTI. Previous cultures reviewed and antibiotic selection based off of results. No red flags concerning for acute pyelonephritis. No concerns for vaginal infection. Pt aware of red flags and to report new, worsening, or persistent symptoms. Medications as prescribed. Symptomatic care discussed in detail.  -     cephALEXin (KEFLEX) 500 MG capsule; Take 1 capsule (500 mg total) by mouth 2 (two) times daily for 7 days.     Follow Up Instructions: Return if symptoms worsen or fail to improve.    I discussed the assessment and treatment plan with the patient. The patient was provided an opportunity to ask questions and all were answered. The patient agreed with the plan and demonstrated an understanding of the instructions.   The patient was advised to call back or seek an in-person evaluation if the symptoms worsen or if the condition fails to improve as anticipated.  The above assessment and management plan was discussed with the patient. The patient verbalized understanding of and has agreed to the management plan. Patient is aware to call the clinic if they develop any new symptoms or if symptoms persist or worsen. Patient is aware when to return to the clinic for a follow-up visit.  Patient educated on when it is appropriate to go to the emergency department.    I provided 12 minutes of time during this telephone encounter.   Monia Pouch, FNP-C [facility] [address] [place], [place] [address] [phone] 06/20/2022

## 2022-07-06 ENCOUNTER — Ambulatory Visit (INDEPENDENT_AMBULATORY_CARE_PROVIDER_SITE_OTHER): Payer: Medicare Other | Admitting: Family Medicine

## 2022-07-06 ENCOUNTER — Encounter: Payer: Self-pay | Admitting: Family Medicine

## 2022-07-06 VITALS — BP 131/73 | HR 68 | Temp 98.6°F | Ht 63.0 in | Wt 130.6 lb

## 2022-07-06 DIAGNOSIS — Z8673 Personal history of transient ischemic attack (TIA), and cerebral infarction without residual deficits: Secondary | ICD-10-CM | POA: Diagnosis not present

## 2022-07-06 DIAGNOSIS — I10 Essential (primary) hypertension: Secondary | ICD-10-CM

## 2022-07-06 DIAGNOSIS — D6851 Activated protein C resistance: Secondary | ICD-10-CM | POA: Diagnosis not present

## 2022-07-06 DIAGNOSIS — M810 Age-related osteoporosis without current pathological fracture: Secondary | ICD-10-CM | POA: Diagnosis not present

## 2022-07-06 DIAGNOSIS — Z7901 Long term (current) use of anticoagulants: Secondary | ICD-10-CM

## 2022-07-06 LAB — COAGUCHEK XS/INR WAIVED
INR: 2.1 — ABNORMAL HIGH (ref 0.9–1.1)
Prothrombin Time: 24.9 s

## 2022-07-06 MED ORDER — WARFARIN SODIUM 5 MG PO TABS: 10.0000 mg | ORAL_TABLET | Freq: Every day | ORAL | 3 refills | Status: DC

## 2022-07-06 MED ORDER — HYDROCHLOROTHIAZIDE 12.5 MG PO CAPS: ORAL_CAPSULE | ORAL | 3 refills | Status: DC

## 2022-07-06 NOTE — Progress Notes (Signed)
Subjective: CC:INR check PCP: [name], DO HPI:[name] is a 67 y.o. female presenting to clinic today for:  1.  Factor V Leyden mutation Goal INR 2-3 Patient compliant with 10 mg of Coumadin daily.  No changes in diet.  No changes in medications except for discontinuation of Boniva.  This is closely followed by her specialist.  She has asked that we obtain labs here for her.  No bleeding issues.   ROS: Per HPI  Allergies  Allergen Reactions   Codeine    Donnatal [Belladonna Alk-Phenobarb Er]    Librium    Past Medical History:  Diagnosis Date   Anemia     Current Outpatient Medications:    cetirizine (ZYRTEC ALLERGY) 10 MG tablet, Take 1 tablet (10 mg total) by mouth daily., Disp: 90 tablet, Rfl: 1   fluticasone (FLONASE) 50 MCG/ACT nasal spray, USE 2 SPRAYS IN EACH NOSTRIL DAILY, Disp: 16 g, Rfl: 6   hydrochlorothiazide (MICROZIDE) 12.5 MG capsule, TAKE ONE (1) CAPSULE EACH DAY, Disp: 90 capsule, Rfl: 0   ibandronate (BONIVA) 150 MG tablet, Take 150 mg by mouth every 30 (thirty) days. Take in the morning with a full glass of water, on an empty stomach, and do not take anything else by mouth or lie down for the next 30 min., Disp: , Rfl:    warfarin (COUMADIN) 5 MG tablet, TAKE 15MG ON TUESDAY & THURSDAY TAKE 10MG ALL OTHER DAYS, Disp: 180 tablet, Rfl: 0 Social History   Socioeconomic History   Marital status: Married    Spouse name: Not on file   Number of children: Not on file   Years of education: Not on file   Highest education level: Not on file  Occupational History   Not on file  Tobacco Use   Smoking status: Never   Smokeless tobacco: Never  Vaping Use   Vaping Use: Never used  Substance and Sexual Activity   Alcohol use: Not Currently    Alcohol/week: 0.0 standard drinks of alcohol   Drug use: Not Currently   Sexual activity: Not on file  Other Topics Concern   Not on file  Social History Narrative   Not on file   Social Determinants  of Health   Financial Resource Strain: Not on file  Food Insecurity: Not on file  Transportation Needs: Not on file  Physical Activity: Not on file  Stress: Not on file  Social Connections: Not on file  Intimate Partner Violence: Not on file   Family History  Problem Relation Age of Onset   Stroke Mother    Cancer Father    Breast cancer Sister 22   Diabetes Brother     Objective: Office vital signs reviewed. BP 131/73   Pulse 68   Temp 98.6 F (37 C)   Ht 5' 3"$  (1.6 m)   Wt 130 lb 9.6 oz (59.2 kg)   SpO2 98%   BMI 23.13 kg/m   Physical Examination:  General: Awake, alert, well nourished, No acute distress HEENT: sclera white, MMM Cardio: regular rate and rhythm, S1S2 heard, no murmurs appreciated Pulm: clear to auscultation bilaterally, no wheezes, rhonchi or rales; normal work of breathing on room air   Assessment/ Plan: 67 y.o. female   Factor V Leiden mutation ([place]) - Plan: CoaguChek XS/INR Waived, CBC, warfarin (COUMADIN) 5 MG tablet  Warfarin anticoagulation - Plan: CoaguChek XS/INR Waived, CBC  Hypertension, essential - Plan: Lipid panel, CMP14+EGFR, hydrochlorothiazide (MICROZIDE) 12.5 MG capsule  Age-related  osteoporosis without current pathological fracture - Plan: CMP14+EGFR, VITAMIN D 25 Hydroxy (Vit-D Deficiency, Fractures)  H/O: CVA (cerebrovascular accident) - Plan: Lipid panel, CMP14+EGFR, TSH  INR therapeutic at 2.1.  No changes.  Continue 10 mg daily.  Future orders have been placed and she will come in for fasting labs.  Orders Placed This Encounter  Procedures   CoaguChek XS/INR Waived   No orders of the defined types were placed in this encounter.    [name], DO [place] [phone]

## 2022-07-18 ENCOUNTER — Encounter: Payer: Self-pay | Admitting: Family Medicine

## 2022-07-18 ENCOUNTER — Other Ambulatory Visit: Payer: Self-pay | Admitting: *Deleted

## 2022-07-18 ENCOUNTER — Telehealth (INDEPENDENT_AMBULATORY_CARE_PROVIDER_SITE_OTHER): Payer: Medicare Other | Admitting: Family Medicine

## 2022-07-18 DIAGNOSIS — R350 Frequency of micturition: Secondary | ICD-10-CM

## 2022-07-18 DIAGNOSIS — R300 Dysuria: Secondary | ICD-10-CM

## 2022-07-18 DIAGNOSIS — N3090 Cystitis, unspecified without hematuria: Secondary | ICD-10-CM | POA: Diagnosis not present

## 2022-07-18 LAB — URINALYSIS
Bilirubin, UA: NEGATIVE
Glucose, UA: NEGATIVE
Ketones, UA: NEGATIVE
Nitrite, UA: NEGATIVE
Protein,UA: NEGATIVE
RBC, UA: NEGATIVE
Specific Gravity, UA: 1.02 (ref 1.005–1.030)
Urobilinogen, Ur: 0.2 mg/dL (ref 0.2–1.0)
pH, UA: 7 (ref 5.0–7.5)

## 2022-07-18 MED ORDER — CEPHALEXIN 500 MG PO CAPS: 500.0000 mg | ORAL_CAPSULE | Freq: Two times a day (BID) | ORAL | 0 refills | Status: DC

## 2022-07-18 NOTE — Progress Notes (Signed)
Subjective:    Patient ID: Roland Earl, female    DOB: 1955/11/05, 67 y.o.   MRN: [id number]   HPI: Katelynd TARALEE POPESCU is a 67 y.o. female presenting for burning with urination and frequency for several days. Denies fever .Right flank pain. No nausea, vomiting.       07/06/2022    3:41 PM 05/08/2022    2:29 PM 03/09/2022    3:06 PM 02/14/2022    3:39 PM 12/22/2021   10:39 AM  Depression screen PHQ 2/9  Decreased Interest 0 0 0 0 0  Down, Depressed, Hopeless 0 0 0 0 0  PHQ - 2 Score 0 0 0 0 0  Altered sleeping 0      Tired, decreased energy 0      Change in appetite 0      Feeling bad or failure about yourself  0      Trouble concentrating 0      Moving slowly or fidgety/restless 0      Suicidal thoughts 0      PHQ-9 Score 0      Difficult doing work/chores Not difficult at all         Relevant past medical, surgical, family and social history reviewed and updated as indicated.  Interim medical history since our last visit reviewed. Allergies and medications reviewed and updated.  ROS:  Review of Systems  Constitutional:  Negative for chills, diaphoresis and fever.  HENT:  Negative for congestion.   Eyes:  Negative for visual disturbance.  Respiratory:  Negative for cough and shortness of breath.   Cardiovascular:  Negative for chest pain and palpitations.  Gastrointestinal:  Negative for constipation, diarrhea and nausea.  Genitourinary:  Positive for dysuria, flank pain (right), frequency and urgency. Negative for decreased urine volume, hematuria, menstrual problem and pelvic pain.  Musculoskeletal:  Negative for arthralgias and joint swelling.  Skin:  Negative for rash.  Neurological:  Negative for dizziness and numbness.     Social History   Tobacco Use  Smoking Status Never  Smokeless Tobacco Never       Objective:     Wt Readings from Last 3 Encounters:  07/06/22 130 lb 9.6 oz (59.2 kg)  05/15/22 130 lb (59 kg)  05/08/22 120 lb 12.8 oz (54.8 kg)      Video visit performed.   Assessment & Plan:   1. Dysuria   2. Frequency of urination   3. Cystitis     Meds ordered this encounter  Medications   cephALEXin (KEFLEX) 500 MG capsule    Sig: Take 1 capsule (500 mg total) by mouth 2 (two) times daily for 7 days.    Dispense:  14 capsule    Refill:  0        Diagnoses and all orders for this visit:  Dysuria -     Urine Culture -     Urinalysis -     cephALEXin (KEFLEX) 500 MG capsule; Take 1 capsule (500 mg total) by mouth 2 (two) times daily for 7 days.  Frequency of urination -     cephALEXin (KEFLEX) 500 MG capsule; Take 1 capsule (500 mg total) by mouth 2 (two) times daily for 7 days.  Cystitis -     cephALEXin (KEFLEX) 500 MG capsule; Take 1 capsule (500 mg total) by mouth 2 (two) times daily for 7 days.    Virtual Visit Note  I discussed the limitations, risks, security and privacy concerns of  performing an evaluation and management service by Video and the availability of in person appointments. The patient was identified with two identifiers. Pt.expressed understanding and agreed to proceed. Pt. Is at home. Dr. Livia Snellen is in his office.  Follow Up Instructions:   I discussed the assessment and treatment plan with the patient. The patient was provided an opportunity to ask questions and all were answered. The patient agreed with the plan and demonstrated an understanding of the instructions.   The patient was advised to call back or seek an in-person evaluation if the symptoms worsen or if the condition fails to improve as anticipated.   Total minutes including chart review and phone contact time: 8   Follow up plan: Return if symptoms worsen or fail to improve.  Claretta Fraise, MD [place]

## 2022-07-21 NOTE — Progress Notes (Signed)
Hello [name],  Your lab result is normal and/or stable.Some minor variations that are not significant are commonly marked abnormal, but do not represent any medical problem for you.  Best regards, Claretta Fraise, M.D.

## 2022-07-22 LAB — URINE CULTURE

## 2022-07-23 ENCOUNTER — Other Ambulatory Visit: Payer: Self-pay | Admitting: Family Medicine

## 2022-07-23 MED ORDER — AMOXICILLIN 500 MG PO CAPS: 500.0000 mg | ORAL_CAPSULE | Freq: Three times a day (TID) | ORAL | 0 refills | Status: DC

## 2022-07-23 NOTE — Progress Notes (Signed)
Your urine culture shows the presence of a germ that is resistant to the current antibiotic you are taking. Please discontinue that medication and take the new one I have sent to your pharmacy.  Best Regards, Claretta Fraise, M.D.

## 2022-07-24 ENCOUNTER — Encounter: Payer: Self-pay | Admitting: *Deleted

## 2022-08-05 ENCOUNTER — Encounter: Payer: Self-pay | Admitting: Family Medicine

## 2022-08-05 ENCOUNTER — Other Ambulatory Visit: Payer: Self-pay | Admitting: Family

## 2022-08-05 DIAGNOSIS — H6121 Impacted cerumen, right ear: Secondary | ICD-10-CM

## 2022-08-30 ENCOUNTER — Ambulatory Visit: Payer: Medicare Other | Admitting: Family Medicine

## 2022-09-05 ENCOUNTER — Encounter: Payer: Self-pay | Admitting: Family Medicine

## 2022-09-05 ENCOUNTER — Ambulatory Visit (INDEPENDENT_AMBULATORY_CARE_PROVIDER_SITE_OTHER): Payer: Medicare Other | Admitting: Family Medicine

## 2022-09-05 VITALS — BP 123/72 | HR 68 | Temp 97.4°F | Ht 63.0 in | Wt 129.0 lb

## 2022-09-05 DIAGNOSIS — J301 Allergic rhinitis due to pollen: Secondary | ICD-10-CM | POA: Diagnosis not present

## 2022-09-05 DIAGNOSIS — H6123 Impacted cerumen, bilateral: Secondary | ICD-10-CM

## 2022-09-05 MED ORDER — FEXOFENADINE-PSEUDOEPHED ER 180-240 MG PO TB24: 1.0000 | ORAL_TABLET | Freq: Every evening | ORAL | 11 refills | Status: DC

## 2022-09-05 NOTE — Progress Notes (Signed)
Subjective:  Patient ID: [name], female    DOB: [birth year]  Age: 67 y.o. MRN: [id number]  CC: Cerumen Impaction   HPI [name] presents for allergy sx - congestion, ears popping. Wax in ears too. No fever. Some rhinorrhea, clear. No cough. Taking zyrtec for allergy.      09/05/2022   10:07 AM 07/06/2022    3:41 PM 05/08/2022    2:29 PM  Depression screen PHQ 2/9  Decreased Interest 0 0 0  Down, Depressed, Hopeless 0 0 0  PHQ - 2 Score 0 0 0  Altered sleeping  0   Tired, decreased energy  0   Change in appetite  0   Feeling bad or failure about yourself   0   Trouble concentrating  0   Moving slowly or fidgety/restless  0   Suicidal thoughts  0   PHQ-9 Score  0   Difficult doing work/chores  Not difficult at all     History [name] has a past medical history of Anemia.   She has no past surgical history on file.   Her family history includes Breast cancer (age of onset: 87) in her sister; Cancer in her father; Diabetes in her brother; Stroke in her mother.She reports that she has never smoked. She has never used smokeless tobacco. She reports that she does not currently use alcohol. She reports that she does not currently use drugs.    ROS Review of Systems  Constitutional: Negative.   HENT: Negative.    Eyes:  Negative for visual disturbance.  Respiratory:  Negative for shortness of breath.   Cardiovascular:  Negative for chest pain.  Gastrointestinal:  Negative for abdominal pain.  Musculoskeletal:  Negative for arthralgias.    Objective:  BP 123/72   Pulse 68   Temp (!) 97.4 F (36.3 C)   Ht  (1.6 m)   Wt 129 lb (58.5 kg)   SpO2 96%   BMI 22.85 kg/m   BP Readings from Last 3 Encounters:  09/05/22 123/72  07/06/22 131/73  05/15/22 (!) 157/81    Wt Readings from Last 3 Encounters:  09/05/22 129 lb (58.5 kg)  07/06/22 130 lb 9.6 oz (59.2 kg)  05/15/22 130 lb (59 kg)     Physical Exam Constitutional:      General: She is not in  acute distress.    Appearance: She is well-developed.  HENT:     Right Ear: There is impacted cerumen.     Left Ear: There is impacted cerumen.  Cardiovascular:     Rate and Rhythm: Normal rate and regular rhythm.  Pulmonary:     Breath sounds: Normal breath sounds.  Musculoskeletal:        General: Normal range of motion.  Skin:    General: Skin is warm and dry.  Neurological:     Mental Status: She is alert and oriented to person, place, and time.     Impaction removed by lavage revealing clear, normal canals & TMS  Assessment & Plan:   [name] was seen today for cerumen impaction.  Diagnoses and all orders for this visit:  Seasonal allergic rhinitis due to pollen  Bilateral impacted cerumen  Other orders -     fexofenadine-pseudoephedrine (ALLEGRA-D 24) 180-240 MG 24 hr tablet; Take 1 tablet by mouth every evening. For allergy and congestion       I have discontinued [name]'s amoxicillin and cetirizine. I am also having her start on  fexofenadine-pseudoephedrine. Additionally, I am having her maintain her fluticasone, hydrochlorothiazide, and warfarin.  Allergies as of 09/05/2022       Reactions   Codeine    Donnatal [belladonna Alk-phenobarb Er]    Librium         Medication List        Accurate as of September 05, 2022 10:41 AM. If you have any questions, ask your nurse or doctor.          STOP taking these medications    amoxicillin 500 MG capsule Commonly known as: AMOXIL Stopped by: Mechele Claude, MD   cetirizine 10 MG tablet Commonly known as: ZYRTEC Stopped by: Mechele Claude, MD       TAKE these medications    fexofenadine-pseudoephedrine 180-240 MG 24 hr tablet Commonly known as: ALLEGRA-D 24 Take 1 tablet by mouth every evening. For allergy and congestion Started by: Mechele Claude, MD   fluticasone 50 MCG/ACT nasal spray Commonly known as: FLONASE USE 2 SPRAYS IN EACH NOSTRIL DAILY   hydrochlorothiazide 12.5 MG  capsule Commonly known as: MICROZIDE TAKE ONE (1) CAPSULE EACH DAY   warfarin 5 MG tablet Commonly known as: COUMADIN Take as directed by the anticoagulation clinic. If you are unsure how to take this medication, talk to your nurse or doctor. Original instructions: Take 2 tablets (10 mg total) by mouth daily.         Follow-up: Return if symptoms worsen or fail to improve.  Mechele Claude, M.D.

## 2022-09-07 ENCOUNTER — Ambulatory Visit: Payer: Medicare Other | Admitting: Family Medicine

## 2022-09-08 DIAGNOSIS — J0100 Acute maxillary sinusitis, unspecified: Secondary | ICD-10-CM | POA: Diagnosis not present

## 2022-09-19 NOTE — Patient Instructions (Signed)
Our records indicate that you are due for your screening mammogram.  Please call the imaging center that does your yearly mammograms to make an appointment for a mammogram at your earliest convenience. Our office also has a mobile unit through the [facility] that comes to our location. Please call our office if you would like to make an appointment.   

## 2022-09-21 ENCOUNTER — Ambulatory Visit (INDEPENDENT_AMBULATORY_CARE_PROVIDER_SITE_OTHER): Payer: Medicare Other | Admitting: Family Medicine

## 2022-09-21 ENCOUNTER — Encounter: Payer: Self-pay | Admitting: Family Medicine

## 2022-09-21 VITALS — BP 123/78 | HR 74 | Temp 98.0°F | Ht 63.0 in | Wt 129.0 lb

## 2022-09-21 DIAGNOSIS — Z7901 Long term (current) use of anticoagulants: Secondary | ICD-10-CM | POA: Diagnosis not present

## 2022-09-21 DIAGNOSIS — D6851 Activated protein C resistance: Secondary | ICD-10-CM | POA: Diagnosis not present

## 2022-09-21 LAB — COAGUCHEK XS/INR WAIVED
INR: 3.3 — ABNORMAL HIGH (ref 0.9–1.1)
Prothrombin Time: 39.2 s

## 2022-09-21 NOTE — Progress Notes (Unsigned)
Subjective: CC:INR check PCP: Raliegh Ip, DO HPI:[name] is a 67 y.o. female presenting to clinic today for:  1.  Factor V Leyden mutation Goal INR 2-3 Patient is compliant with her Coumadin.  She was recently treated with prednisone and doxycycline for a URI.  She has had no changes in diet.  Denies any vaginal bleeding, rectal bleeding or epistaxis.   ROS: Per HPI  Allergies  Allergen Reactions   Codeine    Donnatal [Belladonna Alk-Phenobarb Er]    Librium    Past Medical History:  Diagnosis Date   Anemia     Current Outpatient Medications:    fexofenadine-pseudoephedrine (ALLEGRA-D 24) 180-240 MG 24 hr tablet, Take 1 tablet by mouth every evening. For allergy and congestion, Disp: 30 tablet, Rfl: 11   fluticasone (FLONASE) 50 MCG/ACT nasal spray, USE 2 SPRAYS IN EACH NOSTRIL DAILY, Disp: 16 g, Rfl: 6   hydrochlorothiazide (MICROZIDE) 12.5 MG capsule, TAKE ONE (1) CAPSULE EACH DAY, Disp: 90 capsule, Rfl: 3   warfarin (COUMADIN) 5 MG tablet, Take 2 tablets (10 mg total) by mouth daily., Disp: 180 tablet, Rfl: 3 Social History   Socioeconomic History   Marital status: Married    Spouse name: Not on file   Number of children: Not on file   Years of education: Not on file   Highest education level: Not on file  Occupational History   Not on file  Tobacco Use   Smoking status: Never   Smokeless tobacco: Never  Vaping Use   Vaping Use: Never used  Substance and Sexual Activity   Alcohol use: Not Currently    Alcohol/week: 0.0 standard drinks of alcohol   Drug use: Not Currently   Sexual activity: Not on file  Other Topics Concern   Not on file  Social History Narrative   Not on file   Social Determinants of Health   Financial Resource Strain: Not on file  Food Insecurity: Not on file  Transportation Needs: Not on file  Physical Activity: Not on file  Stress: Not on file  Social Connections: Not on file  Intimate Partner Violence: Not on file    Family History  Problem Relation Age of Onset   Stroke Mother    Cancer Father    Breast cancer Sister 68   Diabetes Brother     Objective: Office vital signs reviewed. BP 123/78   Pulse 74   Temp 98 F (36.7 C)   Ht 5\' 3"  (1.6 m)   Wt 129 lb (58.5 kg)   SpO2 96%   BMI 22.85 kg/m   Physical Examination:  General: Awake, alert, well nourished, No acute distress HEENT: sclera white, MMM Cardio: regular rate and rhythm, S1S2 heard, no murmurs appreciated Pulm: clear to auscultation bilaterally, no wheezes, rhonchi or rales; normal work of breathing on room air    Assessment/ Plan: 67 y.o. female   Factor V Leiden mutation ([facility]) - Plan: CoaguChek XS/INR Waived  Warfarin anticoagulation - Plan: CoaguChek XS/INR Waived  INR slightly supratherapeutic at 3.3 today.  Ongoing to make no changes.  I highly suspect that this is related to recent use of doxycycline.  She may continue her current regimen after skipping today's dose.  We will plan to reconvene again in 6 to 8 weeks, sooner if concerns arise  Orders Placed This Encounter  Procedures   CoaguChek XS/INR Waived   No orders of the defined types were placed in this encounter.    Kazim Corrales  [place], [place] [phone]

## 2022-09-24 ENCOUNTER — Other Ambulatory Visit: Payer: Medicare Other

## 2022-10-05 ENCOUNTER — Other Ambulatory Visit: Payer: Medicare Other

## 2022-10-08 ENCOUNTER — Other Ambulatory Visit: Payer: Medicare Other

## 2022-10-08 DIAGNOSIS — I10 Essential (primary) hypertension: Secondary | ICD-10-CM

## 2022-10-08 DIAGNOSIS — M810 Age-related osteoporosis without current pathological fracture: Secondary | ICD-10-CM | POA: Diagnosis not present

## 2022-10-08 DIAGNOSIS — Z7901 Long term (current) use of anticoagulants: Secondary | ICD-10-CM

## 2022-10-08 DIAGNOSIS — D6851 Activated protein C resistance: Secondary | ICD-10-CM

## 2022-10-08 DIAGNOSIS — Z8673 Personal history of transient ischemic attack (TIA), and cerebral infarction without residual deficits: Secondary | ICD-10-CM

## 2022-10-09 ENCOUNTER — Telehealth: Payer: Self-pay | Admitting: Family Medicine

## 2022-10-09 LAB — CBC
Hematocrit: 45.8 % (ref 34.0–46.6)
Hemoglobin: 15 g/dL (ref 11.1–15.9)
MCH: 31.3 pg (ref 26.6–33.0)
MCHC: 32.8 g/dL (ref 31.5–35.7)
MCV: 96 fL (ref 79–97)
Platelets: 255 10*3/uL (ref 150–450)
RBC: 4.79 x10E6/uL (ref 3.77–5.28)
RDW: 11.9 % (ref 11.7–15.4)
WBC: 5.6 10*3/uL (ref 3.4–10.8)

## 2022-10-09 LAB — CMP14+EGFR
ALT: 28 IU/L (ref 0–32)
AST: 31 IU/L (ref 0–40)
Albumin/Globulin Ratio: 2.2 (ref 1.2–2.2)
Albumin: 4.4 g/dL (ref 3.9–4.9)
Alkaline Phosphatase: 69 IU/L (ref 44–121)
BUN/Creatinine Ratio: 25 (ref 12–28)
BUN: 18 mg/dL (ref 8–27)
Bilirubin Total: 0.4 mg/dL (ref 0.0–1.2)
CO2: 25 mmol/L (ref 20–29)
Calcium: 9.2 mg/dL (ref 8.7–10.3)
Chloride: 100 mmol/L (ref 96–106)
Creatinine, Ser: 0.72 mg/dL (ref 0.57–1.00)
Globulin, Total: 2 g/dL (ref 1.5–4.5)
Glucose: 108 mg/dL — ABNORMAL HIGH (ref 70–99)
Potassium: 4.3 mmol/L (ref 3.5–5.2)
Sodium: 138 mmol/L (ref 134–144)
Total Protein: 6.4 g/dL (ref 6.0–8.5)
eGFR: 93 mL/min/{1.73_m2} (ref >59)

## 2022-10-09 LAB — LIPID PANEL
Chol/HDL Ratio: 2.3 ratio (ref 0.0–4.4)
Cholesterol, Total: 256 mg/dL — ABNORMAL HIGH (ref 100–199)
HDL: 111 mg/dL (ref >39)
LDL Chol Calc (NIH): 132 mg/dL — ABNORMAL HIGH (ref 0–99)
Triglycerides: 78 mg/dL (ref 0–149)
VLDL Cholesterol Cal: 13 mg/dL (ref 5–40)

## 2022-10-09 LAB — TSH: TSH: 4.32 u[IU]/mL (ref 0.450–4.500)

## 2022-10-09 LAB — VITAMIN D 25 HYDROXY (VIT D DEFICIENCY, FRACTURES): Vit D, 25-Hydroxy: 64.1 ng/mL (ref 30.0–100.0)

## 2022-10-09 NOTE — Telephone Encounter (Signed)
Pt called requesting to speak to nurse [facility] regarding labs.

## 2022-10-09 NOTE — Telephone Encounter (Signed)
Went back over labs with patient

## 2022-10-19 ENCOUNTER — Encounter: Payer: Self-pay | Admitting: Family Medicine

## 2022-10-19 ENCOUNTER — Ambulatory Visit (INDEPENDENT_AMBULATORY_CARE_PROVIDER_SITE_OTHER): Payer: Medicare Other | Admitting: Family Medicine

## 2022-10-19 ENCOUNTER — Telehealth: Payer: Self-pay | Admitting: Family Medicine

## 2022-10-19 VITALS — BP 142/87 | HR 80 | Temp 97.5°F | Resp 20 | Ht 63.0 in | Wt 129.0 lb

## 2022-10-19 DIAGNOSIS — H60313 Diffuse otitis externa, bilateral: Secondary | ICD-10-CM

## 2022-10-19 MED ORDER — CIPROFLOXACIN-DEXAMETHASONE 0.3-0.1 % OT SUSP: 4.0000 [drp] | Freq: Two times a day (BID) | OTIC | 0 refills | Status: DC

## 2022-10-19 MED ORDER — CIPROFLOXACIN-HYDROCORTISONE 0.2-1 % OT SUSP: 3.0000 [drp] | Freq: Two times a day (BID) | OTIC | 0 refills | Status: DC

## 2022-10-19 NOTE — Progress Notes (Signed)
Subjective:  Patient ID: [name], female    DOB: Jun 19, 1955, 67 y.o.   MRN: [id number]  Patient Care Team: Raliegh Ip, DO as PCP - General (Family Medicine) Graylin Shiver, MD as Consulting Physician (Gastroenterology) Ginette Otto, [facility] (Pharmacist)   Chief Complaint:  [name]   HPI: [name] is a 67 y.o. female presenting on 10/19/2022 for Otalgia   Pt presents today for recurrent ear canal pruritus, drainage, and flaking of skin along with decreased hearing in left ear. She does have rhinorrhea but no other associated symptoms. Denies recent swimming. States she does get water in her ears when showering.   Otalgia  There is pain in both ears. This is a recurrent problem. The problem has been unchanged. There has been no fever. The pain is mild. Associated symptoms include ear discharge, hearing loss and rhinorrhea. Pertinent negatives include no abdominal pain, coughing, diarrhea, headaches, neck pain, rash, sore throat or vomiting. Treatments tried: antihistamine. The treatment provided no relief.     Relevant past medical, surgical, family, and social history reviewed and updated as indicated.  Allergies and medications reviewed and updated. Data reviewed: Chart in Epic.   Past Medical History:  Diagnosis Date   Anemia     History reviewed. No pertinent surgical history.  Social History   Socioeconomic History   Marital status: Married    Spouse name: Not on file   Number of children: Not on file   Years of education: Not on file   Highest education level: Not on file  Occupational History   Not on file  Tobacco Use   Smoking status: Never   Smokeless tobacco: Never  Vaping Use   Vaping Use: Never used  Substance and Sexual Activity   Alcohol use: Not Currently    Alcohol/week: 0.0 standard drinks of alcohol   Drug use: Not Currently   Sexual activity: Not on file  Other Topics Concern   Not on file  Social History  Narrative   Not on file   Social Determinants of Health   Financial Resource Strain: Not on file  Food Insecurity: Not on file  Transportation Needs: Not on file  Physical Activity: Not on file  Stress: Not on file  Social Connections: Not on file  Intimate Partner Violence: Not on file    Outpatient Encounter Medications as of 10/19/2022  Medication Sig   ciprofloxacin-dexamethasone (CIPRODEX) OTIC suspension Place 4 drops into both ears 2 (two) times daily for 7 days.   fluticasone (FLONASE) 50 MCG/ACT nasal spray USE 2 SPRAYS IN EACH NOSTRIL DAILY   hydrochlorothiazide (MICROZIDE) 12.5 MG capsule TAKE ONE (1) CAPSULE EACH DAY   warfarin (COUMADIN) 5 MG tablet Take 2 tablets (10 mg total) by mouth daily.   [DISCONTINUED] fexofenadine-pseudoephedrine (ALLEGRA-D 24) 180-240 MG 24 hr tablet Take 1 tablet by mouth every evening. For allergy and congestion (Patient not taking: Reported on 10/19/2022)   No facility-administered encounter medications on file as of 10/19/2022.    Allergies  Allergen Reactions   Codeine    Donnatal [Belladonna Alk-Phenobarb Er]    Librium     Review of Systems  Constitutional:  Negative for activity change, appetite change, chills, diaphoresis, fatigue, fever and unexpected weight change.  HENT:  Positive for ear discharge, ear pain, hearing loss and rhinorrhea. Negative for congestion, dental problem, drooling, facial swelling, mouth sores, nosebleeds, postnasal drip, sinus pressure, sinus pain, sneezing, sore throat, tinnitus, trouble swallowing and  voice change.   Eyes: Negative.  Negative for photophobia and visual disturbance.  Respiratory:  Negative for cough, chest tightness and shortness of breath.   Cardiovascular:  Negative for chest pain, palpitations and leg swelling.  Gastrointestinal:  Negative for abdominal pain, blood in stool, constipation, diarrhea, nausea and vomiting.  Endocrine: Negative.   Genitourinary:  Negative for decreased  urine volume, difficulty urinating, dysuria, frequency and urgency.  Musculoskeletal:  Negative for arthralgias, myalgias and neck pain.  Skin: Negative.  Negative for rash.  Allergic/Immunologic: Negative.   Neurological:  Negative for dizziness, tremors, seizures, syncope, facial asymmetry, speech difficulty, weakness, light-headedness, numbness and headaches.  Hematological: Negative.   Psychiatric/Behavioral:  Negative for confusion, hallucinations, sleep disturbance and suicidal ideas.   All other systems reviewed and are negative.       Objective:  BP (!) 142/87   Pulse 80   Temp (!) 97.5 F (36.4 C) (Temporal)   Resp 20   Ht 5\' 3"  (1.6 m)   Wt 129 lb (58.5 kg)   SpO2 96%   BMI 22.85 kg/m    Wt Readings from Last 3 Encounters:  10/19/22 129 lb (58.5 kg)  09/21/22 129 lb (58.5 kg)  09/05/22 129 lb (58.5 kg)    Physical Exam Vitals and nursing note reviewed.  Constitutional:      General: She is not in acute distress.    Appearance: Normal appearance. She is well-developed, well-groomed and normal weight. She is not ill-appearing, toxic-appearing or diaphoretic.  HENT:     Head: Normocephalic and atraumatic.     Jaw: There is normal jaw occlusion.     Right Ear: Hearing normal. Drainage, swelling and tenderness present. No mastoid tenderness.     Left Ear: Hearing normal. Drainage, swelling and tenderness present. No mastoid tenderness.     Nose: Rhinorrhea present. Rhinorrhea is clear.     Mouth/Throat:     Lips: Pink.     Mouth: Mucous membranes are moist.     Pharynx: Oropharynx is clear. Uvula midline.  Eyes:     General: Lids are normal.     Conjunctiva/sclera: Conjunctivae normal.     Pupils: Pupils are equal, round, and reactive to light.  Neck:     Thyroid: No thyroid mass, thyromegaly or thyroid tenderness.     Vascular: No carotid bruit or JVD.     Trachea: Trachea and phonation normal.  Cardiovascular:     Rate and Rhythm: Normal rate and regular  rhythm.     Chest Wall: PMI is not displaced.     Pulses: Normal pulses.     Heart sounds: Normal heart sounds. No murmur heard.    No friction rub. No gallop.  Pulmonary:     Effort: Pulmonary effort is normal. No respiratory distress.     Breath sounds: Normal breath sounds. No wheezing.  Abdominal:     General: There is no abdominal bruit.     Palpations: There is no hepatomegaly or splenomegaly.  Musculoskeletal:     Cervical back: Normal range of motion and neck supple.     Right lower leg: No edema.     Left lower leg: No edema.  Lymphadenopathy:     Cervical: No cervical adenopathy.  Skin:    General: Skin is warm and dry.     Capillary Refill: Capillary refill takes less than 2 seconds.     Coloration: Skin is not cyanotic, jaundiced or pale.     Findings: No rash.  Neurological:  General: No focal deficit present.     Mental Status: She is alert and oriented to person, place, and time.     Sensory: Sensation is intact.     Motor: Motor function is intact.     Coordination: Coordination is intact.     Gait: Gait is intact.     Deep Tendon Reflexes: Reflexes are normal and symmetric.  Psychiatric:        Attention and Perception: Attention and perception normal.        Mood and Affect: Mood and affect normal.        Speech: Speech normal.        Behavior: Behavior normal. Behavior is cooperative.        Thought Content: Thought content normal.        Cognition and Memory: Cognition and memory normal.        Judgment: Judgment normal.     Results for orders placed or performed in visit on 10/08/22  CBC  Result Value Ref Range   WBC 5.6 3.4 - 10.8 x10E3/uL   RBC 4.79 3.77 - 5.28 x10E6/uL   Hemoglobin 15.0 11.1 - 15.9 g/dL   Hematocrit 16.1 09.6 - 46.6 %   MCV 96 79 - 97 fL   MCH 31.3 26.6 - 33.0 pg   MCHC 32.8 31.5 - 35.7 g/dL   RDW 04.5 40.9 - 81.1 %   Platelets 255 150 - 450 x10E3/uL  Lipid panel  Result Value Ref Range   Cholesterol, Total 256 (H) 100  - 199 mg/dL   Triglycerides 78 0 - 149 mg/dL   HDL 914 >78 mg/dL   VLDL Cholesterol Cal 13 5 - 40 mg/dL   LDL Chol Calc (NIH) 295 (H) 0 - 99 mg/dL   Chol/HDL Ratio 2.3 0.0 - 4.4 ratio  CMP14+EGFR  Result Value Ref Range   Glucose 108 (H) 70 - 99 mg/dL   BUN 18 8 - 27 mg/dL   Creatinine, Ser 6.21 0.57 - 1.00 mg/dL   eGFR 93 >30 QM/VHQ/4.69   BUN/Creatinine Ratio 25 12 - 28   Sodium 138 134 - 144 mmol/L   Potassium 4.3 3.5 - 5.2 mmol/L   Chloride 100 96 - 106 mmol/L   CO2 25 20 - 29 mmol/L   Calcium 9.2 8.7 - 10.3 mg/dL   Total Protein 6.4 6.0 - 8.5 g/dL   Albumin 4.4 3.9 - 4.9 g/dL   Globulin, Total 2.0 1.5 - 4.5 g/dL   Albumin/Globulin Ratio 2.2 1.2 - 2.2   Bilirubin Total 0.4 0.0 - 1.2 mg/dL   Alkaline Phosphatase 69 44 - 121 IU/L   AST 31 0 - 40 IU/L   ALT 28 0 - 32 IU/L  TSH  Result Value Ref Range   TSH 4.320 0.450 - 4.500 uIU/mL  VITAMIN D 25 Hydroxy (Vit-D Deficiency, Fractures)  Result Value Ref Range   Vit D, 25-Hydroxy 64.1 30.0 - 100.0 ng/mL       Pertinent labs & imaging results that were available during my care of the patient were reviewed by me and considered in my medical decision making.  Assessment & Plan:  Diagnoses and all orders for this visit:  Acute diffuse otitis externa of both ears Symptomatic care and prevention discussed in detail. Will treat with below. Aware to report new, worsening, or persistent symptoms. If symptoms persist or reoccur, will refer to ENT.  -     ciprofloxacin-dexamethasone (CIPRODEX) OTIC suspension; Place 4 drops into both ears  2 (two) times daily for 7 days.     Continue all other maintenance medications.  Follow up plan: Return if symptoms worsen or fail to improve.   Continue healthy lifestyle choices, including diet (rich in fruits, vegetables, and lean proteins, and low in salt and simple carbohydrates) and exercise (at least 30 minutes of moderate physical activity daily).  Educational handout given for  otitis externa  The above assessment and management plan was discussed with the patient. The patient verbalized understanding of and has agreed to the management plan. Patient is aware to call the clinic if they develop any new symptoms or if symptoms persist or worsen. Patient is aware when to return to the clinic for a follow-up visit. Patient educated on when it is appropriate to go to the emergency department.   Kari Baars, FNP-C [facility] [phone]

## 2022-10-19 NOTE — Telephone Encounter (Signed)
Have sent new RX to pharmacy 

## 2022-10-19 NOTE — Addendum Note (Signed)
Addended by: Sonny Masters on: 10/19/2022 01:10 PM   Modules accepted: Orders

## 2022-10-23 ENCOUNTER — Encounter: Payer: Self-pay | Admitting: Family Medicine

## 2022-10-29 ENCOUNTER — Encounter: Payer: Self-pay | Admitting: Family Medicine

## 2022-10-29 ENCOUNTER — Ambulatory Visit (INDEPENDENT_AMBULATORY_CARE_PROVIDER_SITE_OTHER): Payer: Medicare Other | Admitting: Family Medicine

## 2022-10-29 VITALS — BP 131/80 | HR 70 | Temp 98.5°F | Ht 63.0 in | Wt 128.4 lb

## 2022-10-29 DIAGNOSIS — E7800 Pure hypercholesterolemia, unspecified: Secondary | ICD-10-CM | POA: Diagnosis not present

## 2022-10-29 DIAGNOSIS — Z8673 Personal history of transient ischemic attack (TIA), and cerebral infarction without residual deficits: Secondary | ICD-10-CM

## 2022-10-29 DIAGNOSIS — R739 Hyperglycemia, unspecified: Secondary | ICD-10-CM | POA: Insufficient documentation

## 2022-10-29 NOTE — Progress Notes (Signed)
Subjective: CC: Follow-up labs PCP: Raliegh Ip, DO HPI:[name] is a 67 y.o. female presenting to clinic today for:  1.  Hyperlipidemia with history of CVA Patient recently found to have LDL in the 130s.  She is here to discuss cholesterol medications.  She has not really want to be on cholesterol medicines.  She does have history of CVA but notes that this was precipitated by clot from her untreated factor V Leyden mutation.  She has been stable on anticoagulation since.   ROS: Per HPI  Allergies  Allergen Reactions   Codeine    Donnatal [Belladonna Alk-Phenobarb Er]    Librium    Past Medical History:  Diagnosis Date   Anemia     Current Outpatient Medications:    ciprofloxacin-hydrocortisone (CIPRO HC OTIC) OTIC suspension, Place 3 drops into both ears 2 (two) times daily., Disp: 10 mL, Rfl: 0   fluticasone (FLONASE) 50 MCG/ACT nasal spray, USE 2 SPRAYS IN EACH NOSTRIL DAILY, Disp: 16 g, Rfl: 6   hydrochlorothiazide (MICROZIDE) 12.5 MG capsule, TAKE ONE (1) CAPSULE EACH DAY, Disp: 90 capsule, Rfl: 3   warfarin (COUMADIN) 5 MG tablet, Take 2 tablets (10 mg total) by mouth daily., Disp: 180 tablet, Rfl: 3 Social History   Socioeconomic History   Marital status: Married    Spouse name: Not on file   Number of children: Not on file   Years of education: Not on file   Highest education level: Not on file  Occupational History   Not on file  Tobacco Use   Smoking status: Never   Smokeless tobacco: Never  Vaping Use   Vaping Use: Never used  Substance and Sexual Activity   Alcohol use: Not Currently    Alcohol/week: 0.0 standard drinks of alcohol   Drug use: Not Currently   Sexual activity: Not on file  Other Topics Concern   Not on file  Social History Narrative   Not on file   Social Determinants of Health   Financial Resource Strain: Not on file  Food Insecurity: Not on file  Transportation Needs: Not on file  Physical Activity: Not on file   Stress: Not on file  Social Connections: Not on file  Intimate Partner Violence: Not on file   Family History  Problem Relation Age of Onset   Stroke Mother    Cancer Father    Breast cancer Sister 106   Diabetes Brother     Objective: Office vital signs reviewed. BP 131/80   Pulse 70   Temp 98.5 F (36.9 C)   Ht 5\' 3"  (1.6 m)   Wt 128 lb 6.4 oz (58.2 kg)   SpO2 95%   BMI 22.75 kg/m   Physical Examination:  General: Awake, alert, well nourished, No acute distress  Assessment/ Plan: 67 y.o. female   Pure hypercholesterolemia  History of CVA in adulthood  We discussed today that gold standard of care is statin therapy with history of CVA.  Her CVS course was provoked by her clotting disorder and she is now anticoagulated for that.  Given the elevation in LDL I think this is predominantly counteracted by her very high HDL.  We discussed that HDL is cardioprotective.  We discussed consideration for coronary artery calcium scoring at some point if she would like to further evaluate plaque buildup in the vessels.  For now she would like to hold off on any statin therapy since she is anticoagulated for what seem to provoke her  CVA.   Orders Placed This Encounter  Procedures   CoaguChek XS/INR Waived   Bayer DCA Hb A1c Waived   No orders of the defined types were placed in this encounter.    Raliegh Ip, DO [facility] [phone]

## 2022-11-02 ENCOUNTER — Encounter: Payer: Self-pay | Admitting: Family Medicine

## 2022-11-02 ENCOUNTER — Ambulatory Visit (INDEPENDENT_AMBULATORY_CARE_PROVIDER_SITE_OTHER): Payer: Medicare Other | Admitting: Family Medicine

## 2022-11-02 VITALS — BP 137/78 | HR 66 | Temp 98.2°F | Ht 63.0 in | Wt 128.0 lb

## 2022-11-02 DIAGNOSIS — I10 Essential (primary) hypertension: Secondary | ICD-10-CM | POA: Diagnosis not present

## 2022-11-02 DIAGNOSIS — H60313 Diffuse otitis externa, bilateral: Secondary | ICD-10-CM

## 2022-11-02 MED ORDER — ACETIC ACID 2 % OT SOLN: 4.0000 [drp] | Freq: Two times a day (BID) | OTIC | 0 refills | Status: AC

## 2022-11-02 NOTE — Progress Notes (Signed)
Acute Office Visit  Subjective:  Patient ID: [name], female    DOB: [birth year], 67 y.o.   MRN: [id number]  Chief Complaint  Patient presents with   Ear Pain   HPI Patient is in today for ear drainage and itching. Noticed flaking this morning. She has completed previous otic solution drops. States that she is going out of town next week and would like to get ahead of it. She is using earbuds for 2 hours a day. She has started cleaning them. States that the medication is expensive. Is taking zyrtec and takes Flonase.   ROS As per HPI  Objective:  BP 137/78   Pulse 66   Temp 98.2 F (36.8 C)   Ht 5\' 3"  (1.6 m)   Wt 128 lb (58.1 kg)   SpO2 97%   BMI 22.67 kg/m   Physical Exam Constitutional:      General: She is awake. She is not in acute distress.    Appearance: Normal appearance. She is well-developed and well-groomed. She is not ill-appearing, toxic-appearing or diaphoretic.  HENT:     Right Ear: No decreased hearing noted. No laceration, drainage, swelling or tenderness. A middle ear effusion is present. There is no impacted cerumen. No foreign body. No mastoid tenderness. No PE tube. No hemotympanum. Tympanic membrane is injected. Tympanic membrane is not scarred, perforated, erythematous, retracted or bulging.     Left Ear: No decreased hearing noted. No laceration, drainage, swelling or tenderness. A middle ear effusion is present. There is no impacted cerumen. No foreign body. No mastoid tenderness. No PE tube. No hemotympanum. Tympanic membrane is not injected, scarred, perforated, erythematous, retracted or bulging.     Ears:     Comments: Dry flaky skin in bilateral ear canals and external ears  Cardiovascular:     Rate and Rhythm: Normal rate.     Pulses: Normal pulses.          Radial pulses are 2+ on the right side and 2+ on the left side.       Posterior tibial pulses are 2+ on the right side and 2+ on the left side.     Heart sounds: Normal heart sounds.  No murmur heard.    No gallop.  Pulmonary:     Effort: Pulmonary effort is normal. No respiratory distress.     Breath sounds: Normal breath sounds. No stridor. No wheezing, rhonchi or rales.  Musculoskeletal:     Cervical back: Full passive range of motion without pain and neck supple.     Right lower leg: No edema.     Left lower leg: No edema.  Skin:    General: Skin is warm.     Capillary Refill: Capillary refill takes less than 2 seconds.  Neurological:     General: No focal deficit present.     Mental Status: She is alert, oriented to person, place, and time and easily aroused. Mental status is at baseline.     GCS: GCS eye subscore is 4. GCS verbal subscore is 5. GCS motor subscore is 6.     Motor: No weakness.  Psychiatric:        Attention and Perception: Attention and perception normal.        Mood and Affect: Mood and affect normal.        Speech: Speech normal.        Behavior: Behavior normal. Behavior is cooperative.        Thought Content: Thought  content normal. Thought content does not include homicidal or suicidal ideation. Thought content does not include homicidal or suicidal plan.        Cognition and Memory: Cognition and memory normal.        Judgment: Judgment normal.       11/02/2022    2:43 PM 10/29/2022    4:18 PM 10/19/2022   11:17 AM  Depression screen PHQ 2/9  Decreased Interest 0 0 0  Down, Depressed, Hopeless 0 0 0  PHQ - 2 Score 0 0 0  Altered sleeping 0 0 0  Tired, decreased energy 0 0 0  Change in appetite 0 0 0  Feeling bad or failure about yourself  0 0 0  Trouble concentrating 0 0 0  Moving slowly or fidgety/restless 0 0 0  Suicidal thoughts 0 0 0  PHQ-9 Score 0 0 0  Difficult doing work/chores Not difficult at all Not difficult at all Not difficult at all      11/02/2022    2:44 PM 10/29/2022    4:17 PM 10/19/2022   11:18 AM 09/21/2022    2:45 PM  GAD 7 : Generalized Anxiety Score  Nervous, Anxious, on Edge 0 0 0 0  Control/stop  worrying 0 0 0 0  Worry too much - different things 0 0 0 0  Trouble relaxing 0 0 0 0  Restless 0 0 0 0  Easily annoyed or irritable 0 0 0 0  Afraid - awful might happen 0 0 0 0  Total GAD 7 Score 0 0 0 0  Anxiety Difficulty Not difficult at all Not difficult at all Not difficult at all Not difficult at all   Assessment & Plan:  1. Acute diffuse otitis externa of both ears Discussed with patient treatment options. She declined repeating antibiotic drops due to the cost. Discussed with patient to clean her earbuds after exercise and before. Medication as below. Discussed with patient OTC options for swimmer's ear as well.  - acetic acid 2 % otic solution; Place 4 drops into both ears in the morning and at bedtime.  Dispense: 15 mL; Refill: 0  2. Hypertension, essential BP elevated during visit. Patient had worked out prior. Discussed with patient monitoring at home. Provided written and verbal information on BP. Patient to follow up with PCP for BP management.    The above assessment and management plan was discussed with the patient. The patient verbalized understanding of and has agreed to the management plan using shared-decision making. Patient is aware to call the clinic if they develop any new symptoms or if symptoms fail to improve or worsen. Patient is aware when to return to the clinic for a follow-up visit. Patient educated on when it is appropriate to go to the emergency department.   Return if symptoms worsen or fail to improve.  Neale Burly, DNP-FNP [facility] [address] [place], [place] [address] [phone]

## 2022-11-02 NOTE — Patient Instructions (Addendum)
Otitis externa, acute: Otic: Insert saturated wick of cotton; keep moist 24 hours by adding 3 to 5 drops every 4 to 6 hours. Remove wick after 24 hours and instill 5 drops 3 to 4 times daily. Treatment duration is usually 7 days and may be extended an additional 7 days if symptoms are improving but not yet resolved (Ref).  Monitoring your BP at home   Your BP was elevated today in office  Please keep a log of your BP at home.  The best time to take BP is 1st thing in the morning after waking.  Sit for 5 minutes with feet flat on the floor, arm at heart level.  Options for BP cuffs are at [employer], [employer], [employer], [facility] & [facility].  We will review measurements at follow up and determine plan for BP management.  If you have access, you can send a message in MyChart with your measurements prior to your follow up appointment.  The brand I recommend to get is Omron (Bronze).

## 2022-11-06 DIAGNOSIS — D485 Neoplasm of uncertain behavior of skin: Secondary | ICD-10-CM | POA: Diagnosis not present

## 2022-11-06 DIAGNOSIS — L570 Actinic keratosis: Secondary | ICD-10-CM | POA: Diagnosis not present

## 2022-11-06 DIAGNOSIS — D2271 Melanocytic nevi of right lower limb, including hip: Secondary | ICD-10-CM | POA: Diagnosis not present

## 2022-11-06 DIAGNOSIS — Z1283 Encounter for screening for malignant neoplasm of skin: Secondary | ICD-10-CM | POA: Diagnosis not present

## 2022-11-06 DIAGNOSIS — X32XXXD Exposure to sunlight, subsequent encounter: Secondary | ICD-10-CM | POA: Diagnosis not present

## 2022-11-06 DIAGNOSIS — D225 Melanocytic nevi of trunk: Secondary | ICD-10-CM | POA: Diagnosis not present

## 2022-11-14 ENCOUNTER — Ambulatory Visit: Payer: Medicare Other | Admitting: Podiatry

## 2022-11-14 ENCOUNTER — Encounter: Payer: Self-pay | Admitting: Podiatry

## 2022-11-14 DIAGNOSIS — M76822 Posterior tibial tendinitis, left leg: Secondary | ICD-10-CM | POA: Diagnosis not present

## 2022-11-14 MED ORDER — TRIAMCINOLONE ACETONIDE 10 MG/ML IJ SUSP
10.0000 mg | Freq: Once | INTRAMUSCULAR | Status: AC
  Administered 2022-11-14: 10 mg

## 2022-11-15 NOTE — Progress Notes (Signed)
Subjective:   Patient ID: [name], female   DOB: 67 y.o.   MRN: [id number]   HPI Patient states overall she has been doing pretty good but she has been getting some pain in the side of her foot again and with vacations coming she wanted to see if we can do something that might help   ROS      Objective:  Physical Exam  Neurovascular status intact inflammation around the posterior tibial tendon as it comes near the navicular moderate depression of the arch pain not severe at this time range of motion subtalar midtarsal joint adequate no indication of muscle dysfunction     Assessment:  Posterior tibial tendinitis left with inflammation     Plan:  H&P reviewed condition I went ahead today I did sterile prep I did sheath injection of the posterior tib 3 mg dexamethasone Kenalog 5 mg Xylocaine after explaining chances for injury and I want her to wear her boot for several days just to keep it immobilized.  Patient to be seen back as symptoms indicate

## 2022-11-30 ENCOUNTER — Ambulatory Visit: Payer: Medicare Other | Admitting: Family Medicine

## 2022-11-30 ENCOUNTER — Encounter: Payer: Self-pay | Admitting: Family Medicine

## 2022-11-30 VITALS — BP 139/78 | HR 78 | Temp 98.0°F | Ht 63.0 in | Wt 129.0 lb

## 2022-11-30 DIAGNOSIS — Z8673 Personal history of transient ischemic attack (TIA), and cerebral infarction without residual deficits: Secondary | ICD-10-CM | POA: Diagnosis not present

## 2022-11-30 DIAGNOSIS — H60543 Acute eczematoid otitis externa, bilateral: Secondary | ICD-10-CM | POA: Diagnosis not present

## 2022-11-30 DIAGNOSIS — D6851 Activated protein C resistance: Secondary | ICD-10-CM

## 2022-11-30 DIAGNOSIS — Z7901 Long term (current) use of anticoagulants: Secondary | ICD-10-CM

## 2022-11-30 LAB — COAGUCHEK XS/INR WAIVED
INR: 2.9 — ABNORMAL HIGH (ref 0.9–1.1)
Prothrombin Time: 34.8 s

## 2022-11-30 MED ORDER — TRIAMCINOLONE ACETONIDE 0.1 % EX CREA: 1.0000 | TOPICAL_CREAM | Freq: Two times a day (BID) | CUTANEOUS | 1 refills | Status: AC | PRN

## 2022-11-30 NOTE — Progress Notes (Unsigned)
Subjective: CC:*** PCP: Raliegh Ip, DO HPI:[name] is a 67 y.o. female presenting to clinic today for:  1. ***   ROS: Per HPI  Allergies  Allergen Reactions   Codeine    Donnatal [Belladonna Alk-Phenobarb Er]    Librium    Past Medical History:  Diagnosis Date   Anemia     Current Outpatient Medications:    acetic acid 2 % otic solution, Place 4 drops into both ears in the morning and at bedtime., Disp: 15 mL, Rfl: 0   fluticasone (FLONASE) 50 MCG/ACT nasal spray, USE 2 SPRAYS IN EACH NOSTRIL DAILY, Disp: 16 g, Rfl: 6   hydrochlorothiazide (MICROZIDE) 12.5 MG capsule, TAKE ONE (1) CAPSULE EACH DAY, Disp: 90 capsule, Rfl: 3   warfarin (COUMADIN) 5 MG tablet, Take 2 tablets (10 mg total) by mouth daily., Disp: 180 tablet, Rfl: 3 Social History   Socioeconomic History   Marital status: Married    Spouse name: Not on file   Number of children: Not on file   Years of education: Not on file   Highest education level: Not on file  Occupational History   Not on file  Tobacco Use   Smoking status: Never   Smokeless tobacco: Never  Vaping Use   Vaping status: Never Used  Substance and Sexual Activity   Alcohol use: Not Currently    Alcohol/week: 0.0 standard drinks of alcohol   Drug use: Not Currently   Sexual activity: Not on file  Other Topics Concern   Not on file  Social History Narrative   Not on file   Social Determinants of Health   Financial Resource Strain: Not on file  Food Insecurity: Not on file  Transportation Needs: Not on file  Physical Activity: Not on file  Stress: Not on file  Social Connections: Not on file  Intimate Partner Violence: Not on file   Family History  Problem Relation Age of Onset   Stroke Mother    Cancer Father    Breast cancer Sister 76   Diabetes Brother     Objective: Office vital signs reviewed. There were no vitals taken for this visit.  Physical Examination:  General: Awake, alert, ***  nourished, No acute distress HEENT: Normal    Neck: No masses palpated. No lymphadenopathy    Ears: Tympanic membranes intact, normal light reflex, no erythema, no bulging    Eyes: PERRLA, extraocular membranes intact, sclera ***    Nose: nasal turbinates moist, *** nasal discharge    Throat: moist mucus membranes, no erythema, *** tonsillar exudate.  Airway is patent Cardio: regular rate and rhythm, S1S2 heard, no murmurs appreciated Pulm: clear to auscultation bilaterally, no wheezes, rhonchi or rales; normal work of breathing on room air GI: soft, non-tender, non-distended, bowel sounds present x4, no hepatomegaly, no splenomegaly, no masses GU: external vaginal tissue ***, cervix ***, *** punctate lesions on cervix appreciated, *** discharge from cervical os, *** bleeding, *** cervical motion tenderness, *** abdominal/ adnexal masses Extremities: warm, well perfused, No edema, cyanosis or clubbing; +*** pulses bilaterally MSK: *** gait and *** station Skin: dry; intact; no rashes or lesions Neuro: *** Strength and light touch sensation grossly intact, *** DTRs ***/4  Assessment/ Plan: 67 y.o. female   ***  No orders of the defined types were placed in this encounter.  No orders of the defined types were placed in this encounter.    Raliegh Ip, DO [facility] [phone]

## 2022-12-15 DIAGNOSIS — J0100 Acute maxillary sinusitis, unspecified: Secondary | ICD-10-CM | POA: Diagnosis not present

## 2023-01-25 ENCOUNTER — Ambulatory Visit (INDEPENDENT_AMBULATORY_CARE_PROVIDER_SITE_OTHER): Payer: Medicare Other | Admitting: Family Medicine

## 2023-01-25 ENCOUNTER — Encounter: Payer: Self-pay | Admitting: Family Medicine

## 2023-01-25 VITALS — BP 133/76 | HR 65 | Temp 98.5°F | Ht 63.0 in | Wt 129.0 lb

## 2023-01-25 DIAGNOSIS — R140 Abdominal distension (gaseous): Secondary | ICD-10-CM | POA: Diagnosis not present

## 2023-01-25 DIAGNOSIS — D6851 Activated protein C resistance: Secondary | ICD-10-CM

## 2023-01-25 DIAGNOSIS — Z8673 Personal history of transient ischemic attack (TIA), and cerebral infarction without residual deficits: Secondary | ICD-10-CM | POA: Diagnosis not present

## 2023-01-25 LAB — COAGUCHEK XS/INR WAIVED
INR: 2.1 — ABNORMAL HIGH (ref 0.9–1.1)
Prothrombin Time: 25.1 s

## 2023-01-25 NOTE — Patient Instructions (Signed)
Abdominal Bloating When you have abdominal bloating, your abdomen may feel full, tight, or painful. It may also look bigger than normal or swollen (distended). Common causes of abdominal bloating include: Swallowing air. Constipation. Problems digesting food. Eating too much. Irritable bowel syndrome. This is a condition that affects the large intestine. Lactose intolerance. This is an inability to digest lactose, a natural sugar in dairy products. Celiac disease. This is a condition that affects the ability to digest gluten, a protein found in some grains. Gastroparesis. This is a condition that slows down the movement of food in the stomach and small intestine. It is more common in people with diabetes mellitus. Gastroesophageal reflux disease (GERD). This is a condition that makes stomach acid flow back into the esophagus. Urinary retention. This means that the body is holding onto urine, and the bladder cannot be emptied all the way. Follow these instructions at home: Eating and drinking Avoid eating too much. Try not to swallow air while talking or eating. Avoid eating while lying down. Avoid these foods and drinks: Foods that cause gas, such as broccoli, cabbage, cauliflower, and baked beans. Carbonated drinks. Hard candy. Chewing gum. Medicines Take over-the-counter and prescription medicines only as told by your health care provider. Take probiotic medicines. These medicines contain live bacteria or yeasts that can help digestion. Take coated peppermint oil capsules. General instructions Try to exercise regularly. Exercise may help to relieve bloating that is caused by gas and relieve constipation. Keep all follow-up visits. This is important. Contact a health care provider if: You have nausea and vomiting. You have diarrhea. You have abdominal pain. You have unusual weight loss or weight gain. You have severe pain, and medicines do not help. Get help right away if: You  have chest pain. You have trouble breathing. You have shortness of breath. You have trouble urinating. You have darker urine than normal. You have blood in your stools or have dark, tarry stools. These symptoms may represent a serious problem that is an emergency. Do not wait to see if the symptoms will go away. Get medical help right away. Call your local emergency services (911 in the [place] Do not drive yourself to the hospital. Summary Abdominal bloating means that the abdomen is swollen. Common causes of abdominal bloating are swallowing air, constipation, and problems digesting food. Avoid eating too much and avoid swallowing air. Avoid foods that cause gas, carbonated drinks, hard candy, and chewing gum. This information is not intended to replace advice given to you by your health care provider. Make sure you discuss any questions you have with your health care provider. Document Revised: 12/08/2019 Document Reviewed: 12/08/2019 Elsevier Patient Education  2024 [employer]  

## 2023-01-25 NOTE — Progress Notes (Signed)
Subjective: CC:IR check PCP: Raliegh Ip, DO HPI:[name] is a 67 y.o. female presenting to clinic today for:  1. INR check, Factor V leiden mutation Goal INR 2-3 Compliant with medications.  No reports of bleeding.  2.  Bloating Patient reports some abdominal bloating.  She is not currently taking any probiotics.  She does not report any constipation or diarrhea.  No rectal bleeding.  She seems to associate this with consumption of sugar, specifically dark chocolate and sugar.  She rarely eats sweets but does occasionally indulge in dark chocolate that has a little bit of sugar in it.  This always results in some bloating.  She does not have any bloating when she eats things like whole-wheat bread.  She does have bloating when she eats things like chocolate chip cookies.   ROS: Per HPI  Allergies  Allergen Reactions   Codeine    Donnatal [Belladonna Alk-Phenobarb Er]    Librium    Past Medical History:  Diagnosis Date   Anemia     Current Outpatient Medications:    acetic acid 2 % otic solution, Place 4 drops into both ears in the morning and at bedtime., Disp: 15 mL, Rfl: 0   fluticasone (FLONASE) 50 MCG/ACT nasal spray, USE 2 SPRAYS IN EACH NOSTRIL DAILY, Disp: 16 g, Rfl: 6   hydrochlorothiazide (MICROZIDE) 12.5 MG capsule, TAKE ONE (1) CAPSULE EACH DAY, Disp: 90 capsule, Rfl: 3   triamcinolone cream (KENALOG) 0.1 %, Apply 1 Application topically 2 (two) times daily as needed., Disp: 30 g, Rfl: 1   warfarin (COUMADIN) 5 MG tablet, Take 2 tablets (10 mg total) by mouth daily., Disp: 180 tablet, Rfl: 3 Social History   Socioeconomic History   Marital status: Married    Spouse name: Not on file   Number of children: Not on file   Years of education: Not on file   Highest education level: Not on file  Occupational History   Not on file  Tobacco Use   Smoking status: Never   Smokeless tobacco: Never  Vaping Use   Vaping status: Never Used  Substance and  Sexual Activity   Alcohol use: Not Currently    Alcohol/week: 0.0 standard drinks of alcohol   Drug use: Not Currently   Sexual activity: Not on file  Other Topics Concern   Not on file  Social History Narrative   Not on file   Social Determinants of Health   Financial Resource Strain: Not on file  Food Insecurity: Not on file  Transportation Needs: Not on file  Physical Activity: Not on file  Stress: Not on file  Social Connections: Not on file  Intimate Partner Violence: Not on file   Family History  Problem Relation Age of Onset   Stroke Mother    Cancer Father    Breast cancer Sister 15   Diabetes Brother     Objective: Office vital signs reviewed. BP 133/76   Pulse 65   Temp 98.5 F (36.9 C)   Ht 5\' 3"  (1.6 m)   Wt 129 lb (58.5 kg)   SpO2 97%   BMI 22.85 kg/m   Physical Examination:  General: Awake, alert, well nourished, No acute distress GI: Flat.  Nondistended  Assessment/ Plan: 67 y.o. female   Factor V Leiden mutation (HCC) - Plan: CoaguChek XS/INR Waived  History of CVA in adulthood - Plan: CoaguChek XS/INR Waived  Bloating symptom  INR therapeutic at 2.1.  No changes.  May  follow-up in 2 months  We discussed consideration of a probiotic to stabilize gut bacteria.  Would avoid triggers.   Raliegh Ip, DO [facility] [phone]

## 2023-02-11 ENCOUNTER — Encounter: Payer: Self-pay | Admitting: Podiatry

## 2023-02-11 ENCOUNTER — Ambulatory Visit: Payer: Medicare Other | Admitting: Podiatry

## 2023-02-11 DIAGNOSIS — M76822 Posterior tibial tendinitis, left leg: Secondary | ICD-10-CM

## 2023-02-11 DIAGNOSIS — M76821 Posterior tibial tendinitis, right leg: Secondary | ICD-10-CM | POA: Diagnosis not present

## 2023-02-11 NOTE — Progress Notes (Signed)
Subjective:   Patient ID: [name], female   DOB: 67 y.o.   MRN: [id number]   HPI Patient states left foot is continuing to give her problems not to the same degree but if she is too active on it it can become bothersome.  Overall moderate discomfort and also pain somewhat in the outside of the foot with most of this being with prolonged activity   ROS      Objective:  Physical Exam  Neurovascular status intact inflammation with fluid around the posterior tibial tendon as it comes and inserts into the navicular bone but no indication of tendon damage moderate depression of the arch narrow heel     Assessment:  Posterior tibial tendinitis left which is moderate in its intensity but still present     Plan:  H&P reviewed and at this point discussed ice diclofenac gel and we discussed utilization of orthotics to try to support the plantar arch.  Patient wants orthotics made casted for functional orthotics will be seen back when returned

## 2023-02-15 ENCOUNTER — Encounter: Payer: Self-pay | Admitting: Family Medicine

## 2023-02-15 ENCOUNTER — Ambulatory Visit (INDEPENDENT_AMBULATORY_CARE_PROVIDER_SITE_OTHER): Payer: Medicare Other | Admitting: Family Medicine

## 2023-02-15 VITALS — HR 88 | Temp 98.6°F | Ht 63.0 in | Wt 128.8 lb

## 2023-02-15 DIAGNOSIS — Z0000 Encounter for general adult medical examination without abnormal findings: Secondary | ICD-10-CM

## 2023-02-15 DIAGNOSIS — Z7901 Long term (current) use of anticoagulants: Secondary | ICD-10-CM

## 2023-02-15 DIAGNOSIS — Z789 Other specified health status: Secondary | ICD-10-CM | POA: Insufficient documentation

## 2023-02-15 DIAGNOSIS — D6851 Activated protein C resistance: Secondary | ICD-10-CM

## 2023-02-15 NOTE — Progress Notes (Signed)
Subjective:    [name] is a 67 y.o. female who presents for a Welcome to Medicare exam.   Cardiac Risk Factors include: advanced age (>45men, >31 women);dyslipidemia;hypertension;Other (see comment), Risk factor comments: Factor V leiden mutation      Objective:    Today's Vitals   02/15/23 1103  Pulse: 88  Temp: 98.6 F (37 C)  SpO2: 98%  Weight: 128 lb 12.8 oz (58.4 kg)  Height: 5\' 3"  (1.6 m)  PainSc: 0-No pain  Body mass index is 22.82 kg/m.  Medications Outpatient Encounter Medications as of 02/15/2023  Medication Sig   acetic acid 2 % otic solution Place 4 drops into both ears in the morning and at bedtime.   fluticasone (FLONASE) 50 MCG/ACT nasal spray USE 2 SPRAYS IN EACH NOSTRIL DAILY   hydrochlorothiazide (MICROZIDE) 12.5 MG capsule TAKE ONE (1) CAPSULE EACH DAY   triamcinolone cream (KENALOG) 0.1 % Apply 1 Application topically 2 (two) times daily as needed.   warfarin (COUMADIN) 5 MG tablet Take 2 tablets (10 mg total) by mouth daily.   No facility-administered encounter medications on file as of 02/15/2023.     History: Past Medical History:  Diagnosis Date   Anemia    History reviewed. No pertinent surgical history.  Family History  Problem Relation Age of Onset   Stroke Mother    Cancer Father    Breast cancer Sister 68   Diabetes Brother    Social History   Occupational History   Not on file  Tobacco Use   Smoking status: Never   Smokeless tobacco: Never  Vaping Use   Vaping status: Never Used  Substance and Sexual Activity   Alcohol use: Not Currently    Alcohol/week: 0.0 standard drinks of alcohol   Drug use: Not Currently   Sexual activity: Not on file    Tobacco Counseling Counseling given: Not Answered   Immunizations and Health Maintenance Immunization History  Administered Date(s) Administered   Influenza,inj,Quad PF,6+ Mos 02/19/2013, 04/01/2014, 03/17/2015, 03/15/2016, 03/05/2017, 03/06/2018, 02/09/2019, 02/02/2020,  04/06/2021, 02/14/2022   Influenza-Unspecified 06/02/2009, 03/13/2012   PFIZER(Purple Top)SARS-COV-2 Vaccination 07/31/2019, 08/24/2019   PNEUMOCOCCAL CONJUGATE-20 05/08/2022   Tdap 06/16/2015   Zoster Recombinant(Shingrix) 11/05/2018, 06/23/2019   Zoster, Live 06/18/2013   Zoster, Unspecified 06/18/2013, 06/18/2013   There are no preventive care reminders to display for this patient.  Activities of Daily Living    02/15/2023   12:43 PM 02/15/2023   11:10 AM  In your present state of health, do you have any difficulty performing the following activities:  Hearing?  0  Vision?  0  Difficulty concentrating or making decisions?  0  Walking or climbing stairs?  0  Dressing or bathing?  0  Doing errands, shopping?  0  Preparing Food and eating ? N   Using the Toilet? N   In the past six months, have you accidently leaked urine? N   Do you have problems with loss of bowel control? N   Managing your Medications? N   Managing your Finances? N   Housekeeping or managing your Housekeeping? N     Physical Exam   Physical Exam  Gen: well appearing, fit female. Pulm: normal WOb on room air Psych: mood stable, speech normal, affect appropriate   Advanced Directives: Does Patient Have a Medical Advance Directive?: Yes Type of Advance Directive: Living will, Healthcare Power of Attorney Does patient want to make changes to medical advance directive?: No - Patient declined Copy of Healthcare  Power of Attorney in Chart?: Yes - validated most recent copy scanned in chart (See row information)  EKG:  RRR, no ischemic changes. Unchanged.      Assessment:    This is a routine wellness examination for this patient .  Medicare welcome exam - Plan: EKG 12-Lead  Factor V Leiden mutation (HCC) - Plan: EKG 12-Lead  Warfarin anticoagulation  Full code status  Vision/Hearing screen No issues.  Depression Screen    02/15/2023   11:06 AM 01/25/2023    2:57 PM 11/30/2022    2:57 PM  11/02/2022    2:43 PM  PHQ 2/9 Scores  PHQ - 2 Score 0 0 0 0  PHQ- 9 Score 0 0 0 0     Fall Risk    02/15/2023   11:05 AM  Fall Risk   Falls in the past year? 0  Number falls in past yr: 0  Injury with Fall? 0  Risk for fall due to : No Fall Risks  Follow up Education provided    Cognitive Function:    02/15/2023   11:11 AM  MMSE - Mini Mental State Exam  Orientation to time 5  Orientation to Place 5  Registration 3  Attention/ Calculation 5  Recall 3  Language- name 2 objects 2  Language- repeat 1  Language- follow 3 step command 3  Language- read & follow direction 1  Write a sentence 1  Copy design 1  Total score 30        Patient Care Team: Raliegh Ip, DO as PCP - General (Family Medicine) Graylin Shiver, MD as Consulting Physician (Gastroenterology) [facility] (Pharmacist)     Plan:    I have personally reviewed and noted the following in the patient's chart:   Medical and social history Use of alcohol, tobacco or illicit drugs  Current medications and supplements Functional ability and status Nutritional status Physical activity Advanced directives List of other physicians Hospitalizations, surgeries, and ER visits in previous 12 months Vitals Screenings to include cognitive, depression, and falls Referrals and appointments  In addition, I have reviewed and discussed with patient certain preventive protocols, quality metrics, and best practice recommendations. A written personalized care plan for preventive services as well as general preventive health recommendations were provided to patient.     Delynn Flavin, DO 02/15/2023

## 2023-02-15 NOTE — Patient Instructions (Signed)
Thank you for coming in today for your Annual Medicare Wellness Visit.  Things that we discussed today are included in this packet.  Get your mammogram/ colonoscopy/ DEXA scans as directed by your provider.  Make sure that your medications are organized and safely stored.  Remember to always ask for help if you forget when/ how to take your medications.  Make healthy food choices (Rich in fruits/ veggies/ lean meats and low in salt, sugar and fat)  Do something that you enjoy for at least 30 minutes every day to stay active (walking, gardening, swimming, etc). This will help you lower your risk of falls/ broken bones.  Be social, do puzzles/ crosswords.  These things help the mind stay young and lower your risk of developing dementia.  Make sure that your home is safe by checking your smoke detectors regularly and doing the things outlined below to lower your risk of falls.

## 2023-02-26 ENCOUNTER — Ambulatory Visit (INDEPENDENT_AMBULATORY_CARE_PROVIDER_SITE_OTHER): Payer: Medicare Other | Admitting: Family Medicine

## 2023-02-26 ENCOUNTER — Telehealth: Payer: Self-pay | Admitting: Family Medicine

## 2023-02-26 ENCOUNTER — Encounter: Payer: Self-pay | Admitting: Family Medicine

## 2023-02-26 VITALS — Temp 98.3°F | Ht 63.0 in | Wt 127.0 lb

## 2023-02-26 DIAGNOSIS — D6851 Activated protein C resistance: Secondary | ICD-10-CM

## 2023-02-26 DIAGNOSIS — Z8673 Personal history of transient ischemic attack (TIA), and cerebral infarction without residual deficits: Secondary | ICD-10-CM

## 2023-02-26 DIAGNOSIS — R42 Dizziness and giddiness: Secondary | ICD-10-CM

## 2023-02-26 LAB — COAGUCHEK XS/INR WAIVED
INR: 3.1 — ABNORMAL HIGH (ref 0.9–1.1)
Prothrombin Time: 37.6 s

## 2023-02-26 MED ORDER — MECLIZINE HCL 25 MG PO TABS
25.0000 mg | ORAL_TABLET | Freq: Three times a day (TID) | ORAL | 0 refills | Status: DC | PRN
Start: 2023-02-26 — End: 2023-10-07

## 2023-02-26 NOTE — Telephone Encounter (Signed)
If she felt more dizzy with it then NO. I do not want her to continue it

## 2023-02-26 NOTE — Telephone Encounter (Signed)
Pt called stating that she thinks she has vertigo because she has been having some dizzy spells. Wants to know what Dr Nadine Counts advises for people to do who have vertigo.   Offered pt an appt to be seen today by DOD but pt wants Dr Reece Agar advise first.

## 2023-02-26 NOTE — Patient Instructions (Signed)
Dizziness Dizziness is a common problem. It makes you feel unsteady or light-headed. You may feel like you're about to faint. Dizziness can lead to getting hurt if you stumble or fall. It's more common to feel dizzy if you're an older adult. Many things can cause you to feel dizzy. These include: Medicines. Dehydration. This is when there's not enough water in your body. Illness. Follow these instructions at home: Eating and drinking  Drink enough fluid to keep your pee (urine) pale yellow. This helps keep you from getting dehydrated. Try to drink more clear fluids, such as water. Do not drink alcohol. Try to limit how much caffeine you take in. Try to limit how much salt, also called sodium, you take in. Activity Try not to make quick movements. Stand up slowly from sitting in a chair. Steady yourself until you feel okay. In the morning, first sit up on the side of the bed. When you feel okay, hold onto something and slowly stand up. Do this until you know that your balance is okay. If you need to stand in one place for a long time, move your legs often. Tighten and relax the muscles in your legs while you're standing. Do not drive or use machines if you feel dizzy. Avoid bending down if you feel dizzy. Place items in your home so you can reach them without leaning over. Lifestyle Do not smoke, vape, or use products with nicotine or tobacco in them. If you need help quitting, talk with your health care provider. Try to lower your stress level. You can do this by using methods like yoga or meditation. Talk with your provider if you need help. General instructions Watch your dizziness for any changes. Take your medicines only as told by your provider. Talk with your provider if you think you're dizzy because of a medicine you're taking. Tell a friend or a family member that you're feeling dizzy. If they spot any changes in your behavior, have them call your provider. Contact a health care  provider if: Your dizziness doesn't go away, or you have new symptoms. Your dizziness gets worse. You feel like you may vomit. You have trouble hearing. You have a fever. You have neck pain or a stiff neck. You fall or get hurt. Get help right away if: You vomit each time you eat or drink. You have watery poop and can't eat or drink. You have trouble talking, walking, swallowing, or using your arms, hands, or legs. You feel very weak. You're bleeding. You're not thinking clearly, or you have trouble forming sentences. A friend or family member may spot this. Your vision changes, or you get a very bad headache. These symptoms may be an emergency. Call 911 right away. Do not wait to see if the symptoms will go away. Do not drive yourself to the hospital. This information is not intended to replace advice given to you by your health care provider. Make sure you discuss any questions you have with your health care provider. Document Revised: 06/21/2022 Document Reviewed: 06/21/2022 Elsevier Patient Education  2024 [employer].

## 2023-02-26 NOTE — Progress Notes (Signed)
Subjective: CC: Dizziness PCP: Raliegh Ip, DO HPI:[name] is a 67 y.o. female presenting to clinic today for:  1.  Dizziness Patient is accompanied by her spouse.  She notes onset of dizziness on Sunday.  This seems to be related to position changes in bed or when she rises from bed.  She reports no falls.  No overt vertigo.  No nausea, vomiting.  Denies any inner ear pain, drainage, fevers, sinus symptoms.  No diarrhea.  No bleeding.  No changes in diet or medications.  She recently went to [place] on Saturday but no significant barometric changes that she could identify.  She reports no weakness, sensory changes, difficulty swallowing, visual disturbance.   ROS: Per HPI  Allergies  Allergen Reactions   Codeine    Donnatal [Belladonna Alk-Phenobarb Er]    Librium    Past Medical History:  Diagnosis Date   Anemia     Current Outpatient Medications:    acetic acid 2 % otic solution, Place 4 drops into both ears in the morning and at bedtime., Disp: 15 mL, Rfl: 0   fluticasone (FLONASE) 50 MCG/ACT nasal spray, USE 2 SPRAYS IN EACH NOSTRIL DAILY, Disp: 16 g, Rfl: 6   hydrochlorothiazide (MICROZIDE) 12.5 MG capsule, TAKE ONE (1) CAPSULE EACH DAY, Disp: 90 capsule, Rfl: 3   meclizine (ANTIVERT) 25 MG tablet, Take 1 tablet (25 mg total) by mouth 3 (three) times daily as needed for dizziness., Disp: 30 tablet, Rfl: 0   triamcinolone cream (KENALOG) 0.1 %, Apply 1 Application topically 2 (two) times daily as needed., Disp: 30 g, Rfl: 1   warfarin (COUMADIN) 5 MG tablet, Take 2 tablets (10 mg total) by mouth daily., Disp: 180 tablet, Rfl: 3 Social History   Socioeconomic History   Marital status: Married    Spouse name: Not on file   Number of children: Not on file   Years of education: Not on file   Highest education level: Not on file  Occupational History   Not on file  Tobacco Use   Smoking status: Never   Smokeless tobacco: Never  Vaping Use   Vaping status:  Never Used  Substance and Sexual Activity   Alcohol use: Not Currently    Alcohol/week: 0.0 standard drinks of alcohol   Drug use: Not Currently   Sexual activity: Not on file  Other Topics Concern   Not on file  Social History Narrative   Not on file   Social Determinants of Health   Financial Resource Strain: Not on file  Food Insecurity: Not on file  Transportation Needs: Not on file  Physical Activity: Not on file  Stress: Not on file  Social Connections: Not on file  Intimate Partner Violence: Not on file   Family History  Problem Relation Age of Onset   Stroke Mother    Cancer Father    Breast cancer Sister 71   Diabetes Brother     Objective: Office vital signs reviewed. Temp 98.3 F (36.8 C)   Ht 5\' 3"  (1.6 m)   Wt 127 lb (57.6 kg)   SpO2 95%   BMI 22.50 kg/m   Physical Examination:  General: Awake, alert, well nourished, No acute distress HEENT: Normal    Neck: No masses palpated. No lymphadenopathy    Ears: Tympanic membranes intact, normal light reflex, no erythema, no bulging.  Scant dried skin and cerumen appreciated in the external auditory canal bilaterally    Eyes: PERRLA, extraocular membranes intact,  sclera white    Throat: moist mucus membranes.  Symmetric rise of palate Cardio: regular rate and rhythm MSK: Normal gait and station.  Ambulating independently. Neuro: 5/5 upper and lower extremity strength.  Light touch and station grossly intact.  Cranial nerves II through XII grossly intact.  No focal neurologic deficits.  Negative Romberg.  Normal upper extremity and lower extremity cerebellar testing.  Orthostatic VS for the past 72 hrs (Last 3 readings):  Orthostatic BP Patient Position BP Location Cuff Size Orthostatic Pulse  02/26/23 1309 151/86 Supine Right Arm Normal --  02/26/23 1308 156/76 Standing Right Arm Normal --  02/26/23 1255 125/73 Sitting Right Arm Normal 72    Assessment/ Plan: 67 y.o. female   Dizziness - Plan:  CMP14+EGFR, CBC, Magnesium, meclizine (ANTIVERT) 25 MG tablet  Factor V Leiden mutation (HCC) - Plan: CoaguChek XS/INR Waived  Her neurologic exam was totally normal.  Of course hypercoagulable state puts her at higher risk of stroke and other cardiovascular events.  Will check INR today along with electrolytes.  Meclizine given for as needed use.  Advised not to use longer than next couple of days so as not to mass symptoms if they are persistent or worsening.   However, suspect this is going to be a transient issue and possibly related to inner ear versus orthostasis.  She had about a 20 point drop in blood pressure from seated to sitting position but this rose back up when she would stand.  She was really only symptomatic with standing during orthostatic vitals.  She demonstrated no evidence of lower extremity edema but we discussed that sometimes the vascular system is slow to respond as we age and you can get temporary drops in blood pressure.  I have encouraged her to continue hydrating appropriately especially after exercise.  Utilize allergy medicine if needed.  Move Flonase to evening time.  Advised her to contact me should symptoms worsen, she develop any red flag signs or symptoms or if symptoms simply do not resolve.  If the former, we will plan to proceed with scan of the brain.  If the latter, plan for referral to ENT for further evaluation.  Both she and her husband voiced good understanding will follow-up accordingly   Raliegh Ip, DO [facility] [phone]

## 2023-02-26 NOTE — Telephone Encounter (Signed)
Pt has apt with [place] today

## 2023-02-26 NOTE — Telephone Encounter (Signed)
Pt called to let PCP know that she took the Meclizine that was prescribed to her today but says after taking the 1st pill, it made her even more dizzy. Wants to know if she should continue?

## 2023-02-27 LAB — CMP14+EGFR
ALT: 23 [IU]/L (ref 0–32)
AST: 25 [IU]/L (ref 0–40)
Albumin: 4.4 g/dL (ref 3.9–4.9)
Alkaline Phosphatase: 64 [IU]/L (ref 44–121)
BUN/Creatinine Ratio: 27 (ref 12–28)
BUN: 21 mg/dL (ref 8–27)
Bilirubin Total: 0.2 mg/dL (ref 0.0–1.2)
CO2: 20 mmol/L (ref 20–29)
Calcium: 9.4 mg/dL (ref 8.7–10.3)
Chloride: 99 mmol/L (ref 96–106)
Creatinine, Ser: 0.79 mg/dL (ref 0.57–1.00)
Globulin, Total: 2 g/dL (ref 1.5–4.5)
Glucose: 143 mg/dL — ABNORMAL HIGH (ref 70–99)
Potassium: 4 mmol/L (ref 3.5–5.2)
Sodium: 137 mmol/L (ref 134–144)
Total Protein: 6.4 g/dL (ref 6.0–8.5)
eGFR: 83 mL/min/{1.73_m2} (ref >59)

## 2023-02-27 LAB — CBC
Hematocrit: 43.5 % (ref 34.0–46.6)
Hemoglobin: 14.5 g/dL (ref 11.1–15.9)
MCH: 32.3 pg (ref 26.6–33.0)
MCHC: 33.3 g/dL (ref 31.5–35.7)
MCV: 97 fL (ref 79–97)
Platelets: 253 10*3/uL (ref 150–450)
RBC: 4.49 x10E6/uL (ref 3.77–5.28)
RDW: 12.3 % (ref 11.7–15.4)
WBC: 6.2 10*3/uL (ref 3.4–10.8)

## 2023-02-27 LAB — MAGNESIUM: Magnesium: 2.2 mg/dL (ref 1.6–2.3)

## 2023-02-27 NOTE — Telephone Encounter (Signed)
As we discussed if her symptoms were not improving or if they were getting worse over the next couple of days I will order imaging of her brain and/or refer her to ENT.  Her neurologic exam of note was totally normal.

## 2023-02-27 NOTE — Telephone Encounter (Signed)
Pt will call back tomorrow and let Dr. Reece Agar. Is she is not any better Pt gets dizzy after getting up from laying down and is worried about having another stroke

## 2023-02-27 NOTE — Telephone Encounter (Signed)
Patient notified and verbalized understanding. She states that she saw her lab results on Mychart and wants to know what the next step is for her because she feels like something is wrong. Please advise

## 2023-02-28 ENCOUNTER — Encounter (HOSPITAL_COMMUNITY): Payer: Self-pay | Admitting: Radiology

## 2023-02-28 ENCOUNTER — Ambulatory Visit (HOSPITAL_COMMUNITY)
Admission: RE | Admit: 2023-02-28 | Discharge: 2023-02-28 | Disposition: A | Discharge status: Home / Self Care | Payer: Medicare Other | Source: Ambulatory Visit | Attending: Family Medicine | Admitting: Family Medicine

## 2023-02-28 DIAGNOSIS — R42 Dizziness and giddiness: Secondary | ICD-10-CM | POA: Diagnosis not present

## 2023-02-28 DIAGNOSIS — Z8673 Personal history of transient ischemic attack (TIA), and cerebral infarction without residual deficits: Secondary | ICD-10-CM | POA: Diagnosis not present

## 2023-02-28 DIAGNOSIS — D6851 Activated protein C resistance: Secondary | ICD-10-CM | POA: Insufficient documentation

## 2023-02-28 DIAGNOSIS — I6782 Cerebral ischemia: Secondary | ICD-10-CM | POA: Diagnosis not present

## 2023-02-28 NOTE — Telephone Encounter (Signed)
Pt has been notified.

## 2023-02-28 NOTE — Telephone Encounter (Signed)
Stat order placed. She will be called with instructions.

## 2023-02-28 NOTE — Telephone Encounter (Signed)
Pt wants to let DR Nadine Counts know that her dizziness is not better. Pt agrees with imaging and referral to ENT. Pt wants to having something done today because she is worried about her symptoms. Please call back

## 2023-03-01 ENCOUNTER — Telehealth: Payer: Self-pay | Admitting: Family Medicine

## 2023-03-04 ENCOUNTER — Telehealth: Payer: Self-pay | Admitting: Family Medicine

## 2023-03-04 NOTE — Telephone Encounter (Signed)
Of course.  [place], can you coordinate a different day for her?  She usually likes afternoon

## 2023-03-04 NOTE — Telephone Encounter (Signed)
Just let her know. The MRI was negative

## 2023-03-04 NOTE — Telephone Encounter (Signed)
Unfortunately 03/06/2023 would be the soonest availability. Per [facility] their Office's are scheduling out until November.

## 2023-03-04 NOTE — Telephone Encounter (Signed)
Pt has been notified - she will call back regarding apt

## 2023-03-04 NOTE — Telephone Encounter (Signed)
Pt called stating that she just had her lab work done last week. Wants to know if she can move her 8 wk INR appt that's currently scheduled on 10/25 out 8wks from last weeks appt?

## 2023-03-06 ENCOUNTER — Encounter (INDEPENDENT_AMBULATORY_CARE_PROVIDER_SITE_OTHER): Payer: Self-pay

## 2023-03-06 ENCOUNTER — Ambulatory Visit (INDEPENDENT_AMBULATORY_CARE_PROVIDER_SITE_OTHER): Payer: Medicare Other | Admitting: Otolaryngology

## 2023-03-06 VITALS — Ht 63.0 in | Wt 125.0 lb

## 2023-03-06 DIAGNOSIS — H8112 Benign paroxysmal vertigo, left ear: Secondary | ICD-10-CM | POA: Diagnosis not present

## 2023-03-06 DIAGNOSIS — R42 Dizziness and giddiness: Secondary | ICD-10-CM

## 2023-03-07 ENCOUNTER — Ambulatory Visit: Payer: Medicare Other | Admitting: Family Medicine

## 2023-03-07 ENCOUNTER — Encounter: Payer: Self-pay | Admitting: Family Medicine

## 2023-03-07 ENCOUNTER — Other Ambulatory Visit: Payer: Medicare Other

## 2023-03-07 VITALS — BP 141/74 | HR 74 | Temp 97.6°F | Ht 63.0 in | Wt 127.2 lb

## 2023-03-07 DIAGNOSIS — R7309 Other abnormal glucose: Secondary | ICD-10-CM

## 2023-03-07 DIAGNOSIS — R002 Palpitations: Secondary | ICD-10-CM

## 2023-03-07 DIAGNOSIS — R42 Dizziness and giddiness: Secondary | ICD-10-CM | POA: Diagnosis not present

## 2023-03-07 DIAGNOSIS — R6889 Other general symptoms and signs: Secondary | ICD-10-CM | POA: Diagnosis not present

## 2023-03-07 LAB — BAYER DCA HB A1C WAIVED: HB A1C (BAYER DCA - WAIVED): 5.8 % — ABNORMAL HIGH (ref 4.8–5.6)

## 2023-03-07 NOTE — Progress Notes (Signed)
Subjective:  Patient ID: [name], female    DOB: 12-23-1955, 67 y.o.   MRN: [id number]  Patient Care Team: Raliegh Ip, DO as PCP - General (Family Medicine) Graylin Shiver, MD as Consulting Physician (Gastroenterology) Ginette Otto, [facility] (Pharmacist)   Chief Complaint:  Dizziness Micah Flesher to ENT yesterday and he did head movements for vertigo.  States she was fine until she went to work out this morning and she felt her heart rate going faster than normal.  Patient states she feels fine now. )   HPI: [name] is a 67 y.o. female presenting on 03/07/2023 for Dizziness Micah Flesher to ENT yesterday and he did head movements for vertigo.  States she was fine until she went to work out this morning and she felt her heart rate going faster than normal.  Patient states she feels fine now. )   Discussed the use of AI scribe software for clinical note transcription with the patient, who gave verbal consent to proceed.  History of Present Illness   The patient, [name], presented with a chief complaint of dizziness, primarily occurring at night. She recently underwent an MRI and other tests due to this issue. An ENT specialist diagnosed her with a mild form of vertigo and performed a maneuver to alleviate the symptoms. The patient was advised to remain upright for two nights following the procedure.  Despite following the ENT's instructions, the patient experienced an unusual sensation during a gym session, which she described as her heart beating fast. This episode did not cause dizziness, but it left the patient feeling 'weird.' The episode occurred on the day of the consultation, shortly after her gym session. The patient did not report any associated symptoms such as shortness of breath, chest pain, nausea, vomiting, or diaphoresis. The palpitations subsided upon resting.  The patient denied any abnormal bleeding or bruising and reported staying well-hydrated throughout  the day. She had a previous episode of orthostatic hypotension during a consultation with Dr. Nadine Counts. However, her blood pressure was high-normal during the current consultation. The patient confirmed adherence to her blood pressure medication but did not monitor her blood pressure at home.  The patient's glucose levels were slightly elevated during her last visit, but she had eaten prior to the consultation. The patient could not recall the exact timing of her meal before the consultation.      Relevant past medical, surgical, family, and social history reviewed and updated as indicated.  Allergies and medications reviewed and updated. Data reviewed: Chart in Epic.   Past Medical History:  Diagnosis Date   Anemia     History reviewed. No pertinent surgical history.  Social History   Socioeconomic History   Marital status: Married    Spouse name: Not on file   Number of children: Not on file   Years of education: Not on file   Highest education level: Not on file  Occupational History   Not on file  Tobacco Use   Smoking status: Never   Smokeless tobacco: Never  Vaping Use   Vaping status: Never Used  Substance and Sexual Activity   Alcohol use: Not Currently    Alcohol/week: 0.0 standard drinks of alcohol   Drug use: Not Currently   Sexual activity: Not on file  Other Topics Concern   Not on file  Social History Narrative   Not on file   Social Determinants of Health   Financial Resource Strain: Not on  file  Food Insecurity: Not on file  Transportation Needs: Not on file  Physical Activity: Not on file  Stress: Not on file  Social Connections: Not on file  Intimate Partner Violence: Not on file    Outpatient Encounter Medications as of 03/07/2023  Medication Sig   acetic acid 2 % otic solution Place 4 drops into both ears in the morning and at bedtime.   fluticasone (FLONASE) 50 MCG/ACT nasal spray USE 2 SPRAYS IN EACH NOSTRIL DAILY   hydrochlorothiazide  (MICROZIDE) 12.5 MG capsule TAKE ONE (1) CAPSULE EACH DAY   meclizine (ANTIVERT) 25 MG tablet Take 1 tablet (25 mg total) by mouth 3 (three) times daily as needed for dizziness.   triamcinolone cream (KENALOG) 0.1 % Apply 1 Application topically 2 (two) times daily as needed.   warfarin (COUMADIN) 5 MG tablet Take 2 tablets (10 mg total) by mouth daily.   No facility-administered encounter medications on file as of 03/07/2023.    Allergies  Allergen Reactions   Codeine    Donnatal [Belladonna Alk-Phenobarb Er]    Librium     Pertinent ROS per HPI, otherwise unremarkable      Objective:  BP (!) 141/74   Pulse 74   Temp 97.6 F (36.4 C) (Temporal)   Ht 5\' 3"  (1.6 m)   Wt 127 lb 3.2 oz (57.7 kg)   SpO2 95%   BMI 22.53 kg/m    Wt Readings from Last 3 Encounters:  03/07/23 127 lb 3.2 oz (57.7 kg)  03/06/23 125 lb (56.7 kg)  02/26/23 127 lb (57.6 kg)    Physical Exam Vitals and nursing note reviewed.  Constitutional:      General: She is not in acute distress.    Appearance: Normal appearance. She is well-developed and well-groomed. She is not ill-appearing, toxic-appearing or diaphoretic.  HENT:     Head: Normocephalic and atraumatic.     Jaw: There is normal jaw occlusion.     Right Ear: Hearing normal.     Left Ear: Hearing normal.     Nose: Nose normal.     Mouth/Throat:     Lips: Pink.     Mouth: Mucous membranes are moist.     Pharynx: Oropharynx is clear. Uvula midline.  Eyes:     General: Lids are normal.     Extraocular Movements: Extraocular movements intact.     Conjunctiva/sclera: Conjunctivae normal.     Pupils: Pupils are equal, round, and reactive to light.  Neck:     Thyroid: No thyroid mass, thyromegaly or thyroid tenderness.     Vascular: No carotid bruit or JVD.     Trachea: Trachea and phonation normal.  Cardiovascular:     Rate and Rhythm: Normal rate and regular rhythm.     Chest Wall: PMI is not displaced.     Pulses: Normal pulses.      Heart sounds: Normal heart sounds. No murmur heard.    No friction rub. No gallop.  Pulmonary:     Effort: Pulmonary effort is normal. No respiratory distress.     Breath sounds: Normal breath sounds. No wheezing.  Abdominal:     General: Bowel sounds are normal.     Palpations: Abdomen is soft.  Musculoskeletal:        General: Normal range of motion.     Cervical back: Normal range of motion and neck supple.     Right lower leg: No edema.     Left lower leg: No edema.  Skin:    General: Skin is warm and dry.     Capillary Refill: Capillary refill takes less than 2 seconds.     Coloration: Skin is not cyanotic, jaundiced or pale.     Findings: No rash.  Neurological:     General: No focal deficit present.     Mental Status: She is alert and oriented to person, place, and time.     Sensory: Sensation is intact.     Motor: Motor function is intact.     Coordination: Coordination is intact.     Gait: Gait is intact.     Deep Tendon Reflexes: Reflexes are normal and symmetric.  Psychiatric:        Attention and Perception: Attention and perception normal.        Mood and Affect: Mood and affect normal.        Speech: Speech normal.        Behavior: Behavior normal. Behavior is cooperative.        Thought Content: Thought content normal.        Cognition and Memory: Cognition and memory normal.        Judgment: Judgment normal.    Physical Exam   VITALS: BP- high normal        Results for orders placed or performed in visit on 02/26/23  CMP14+EGFR  Result Value Ref Range   Glucose 143 (H) 70 - 99 mg/dL   BUN 21 8 - 27 mg/dL   Creatinine, Ser 4.03 0.57 - 1.00 mg/dL   eGFR 83 >47 QQ/VZD/6.38   BUN/Creatinine Ratio 27 12 - 28   Sodium 137 134 - 144 mmol/L   Potassium 4.0 3.5 - 5.2 mmol/L   Chloride 99 96 - 106 mmol/L   CO2 20 20 - 29 mmol/L   Calcium 9.4 8.7 - 10.3 mg/dL   Total Protein 6.4 6.0 - 8.5 g/dL   Albumin 4.4 3.9 - 4.9 g/dL   Globulin, Total 2.0 1.5 - 4.5  g/dL   Bilirubin Total 0.2 0.0 - 1.2 mg/dL   Alkaline Phosphatase 64 44 - 121 IU/L   AST 25 0 - 40 IU/L   ALT 23 0 - 32 IU/L  CBC  Result Value Ref Range   WBC 6.2 3.4 - 10.8 x10E3/uL   RBC 4.49 3.77 - 5.28 x10E6/uL   Hemoglobin 14.5 11.1 - 15.9 g/dL   Hematocrit 75.6 43.3 - 46.6 %   MCV 97 79 - 97 fL   MCH 32.3 26.6 - 33.0 pg   MCHC 33.3 31.5 - 35.7 g/dL   RDW 29.5 18.8 - 41.6 %   Platelets 253 150 - 450 x10E3/uL  Magnesium  Result Value Ref Range   Magnesium 2.2 1.6 - 2.3 mg/dL  CoaguChek XS/INR Waived  Result Value Ref Range   INR 3.1 (H) 0.9 - 1.1   Prothrombin Time 37.6 sec     EKG: SR 69, PR 170 ms, QT 409 ms, no acute ST-T changes, LVH pattern, no ectopy, no changes from prior EKG. Emily Baars, FNP-C  Pertinent labs & imaging results that were available during my care of the patient were reviewed by me and considered in my medical decision making.  Assessment & Plan:  Cierah was seen today for dizziness.  Diagnoses and all orders for this visit:  Palpitations -     EKG 12-Lead -     Bayer DCA Hb A1c Waived -     Thyroid Panel With TSH -  Anemia Profile B -     LONG TERM MONITOR (3-14 DAYS); Future  Elevated glucose -     Bayer DCA Hb A1c Waived  Dizziness -     Bayer DCA Hb A1c Waived -     Thyroid Panel With TSH -     Anemia Profile B -     LONG TERM MONITOR (3-14 DAYS); Future  Vertigo     Assessment and Plan    Vertigo Recently diagnosed with a mild form of vertigo. Underwent positional maneuvers and advised to avoid lying flat for two days. No current dizziness reported. -Continue current management as advised by ENT specialist.  Palpitations New onset palpitations experienced during exercise, without associated chest pain, shortness of breath, nausea, vomiting, or diaphoresis. No current palpitations at rest. -Apply Zio patch for two weeks to monitor heart rate and rhythm. -Check thyroid function and HbA1c today to rule out hyperthyroidism  and diabetes as potential causes. -Advise to rest from exercise for a day and then resume to assess if palpitations recur.  Hypertension Blood pressure high normal despite medication. No home monitoring currently. -Continue current antihypertensive medication. -Advise to monitor blood pressure at home. -Provide DASH diet information to help lower blood pressure. -Plan follow-up with Dr. Nadine Counts in 8 weeks to reassess blood pressure control.  Hyperglycemia Previous elevated glucose level after eating. -Check HbA1c today to assess overall glucose control. -Advise to monitor diet and continue regular exercise.          Continue all other maintenance medications.  Follow up plan: Return if symptoms worsen or fail to improve.   Continue healthy lifestyle choices, including diet (rich in fruits, vegetables, and lean proteins, and low in salt and simple carbohydrates) and exercise (at least 30 minutes of moderate physical activity daily).  Educational handout given for DASH diet, palpitations   The above assessment and management plan was discussed with the patient. The patient verbalized understanding of and has agreed to the management plan. Patient is aware to call the clinic if they develop any new symptoms or if symptoms persist or worsen. Patient is aware when to return to the clinic for a follow-up visit. Patient educated on when it is appropriate to go to the emergency department.   Emily Baars, FNP-C [facility] [phone]

## 2023-03-07 NOTE — Patient Instructions (Signed)

## 2023-03-08 LAB — THYROID PANEL WITH TSH
Free Thyroxine Index: 2.2 (ref 1.2–4.9)
T3 Uptake Ratio: 32 % (ref 24–39)
T4, Total: 6.9 ug/dL (ref 4.5–12.0)
TSH: 3.34 u[IU]/mL (ref 0.450–4.500)

## 2023-03-08 LAB — ANEMIA PROFILE B
Basophils Absolute: 0.1 10*3/uL (ref 0.0–0.2)
Basos: 1 %
EOS (ABSOLUTE): 0.1 10*3/uL (ref 0.0–0.4)
Eos: 1 %
Ferritin: 211 ng/mL — ABNORMAL HIGH (ref 15–150)
Folate: >20 ng/mL (ref >3.0)
Hematocrit: 43.8 % (ref 34.0–46.6)
Hemoglobin: 14.5 g/dL (ref 11.1–15.9)
Immature Grans (Abs): 0 10*3/uL (ref 0.0–0.1)
Immature Granulocytes: 0 %
Iron Saturation: 40 % (ref 15–55)
Iron: 122 ug/dL (ref 27–139)
Lymphocytes Absolute: 1.5 10*3/uL (ref 0.7–3.1)
Lymphs: 26 %
MCH: 32.2 pg (ref 26.6–33.0)
MCHC: 33.1 g/dL (ref 31.5–35.7)
MCV: 97 fL (ref 79–97)
Monocytes Absolute: 0.6 10*3/uL (ref 0.1–0.9)
Monocytes: 10 %
Neutrophils Absolute: 3.4 10*3/uL (ref 1.4–7.0)
Neutrophils: 62 %
Platelets: 244 10*3/uL (ref 150–450)
RBC: 4.5 x10E6/uL (ref 3.77–5.28)
RDW: 12.1 % (ref 11.7–15.4)
Retic Ct Pct: 1.2 % (ref 0.6–2.6)
Total Iron Binding Capacity: 306 ug/dL (ref 250–450)
UIBC: 184 ug/dL (ref 118–369)
Vitamin B-12: 974 pg/mL (ref 232–1245)
WBC: 5.6 10*3/uL (ref 3.4–10.8)

## 2023-03-10 DIAGNOSIS — R42 Dizziness and giddiness: Secondary | ICD-10-CM | POA: Insufficient documentation

## 2023-03-10 DIAGNOSIS — H8112 Benign paroxysmal vertigo, left ear: Secondary | ICD-10-CM | POA: Insufficient documentation

## 2023-03-10 NOTE — Progress Notes (Unsigned)
Patient ID: [name], female   DOB: 1956/01/19, 67 y.o.   MRN: [id number]  CC: Recurrent dizziness  HPI:  [name] is an 67 y.o. female who presents today complaining of recurrent dizziness.  According to the patient, she has been symptomatic for the past 2 weeks.  She describes her dizziness as a spinning vertigo that lasts for several seconds.  It is often followed by lightheaded and off-balance sensation.  Her MRI scan was negative for retrocochlear lesion.  She was treated with meclizine without improvement in her symptoms.  Dizziness is often triggered at night, when she turns her head to the left.  She has a history of recurrent otitis externa.  Currently she denies any otalgia, otorrhea, tinnitus, or hearing loss.  She has a history of hypertension.  She is on hypertensive medications.  She has no previous ENT surgery.  Past Medical History:  Diagnosis Date   Anemia     History reviewed. No pertinent surgical history.  Family History  Problem Relation Age of Onset   Stroke Mother    Cancer Father    Breast cancer Sister 25   Diabetes Brother     Social History:  reports that she has never smoked. She has never used smokeless tobacco. She reports that she does not currently use alcohol. She reports that she does not currently use drugs.  Allergies:  Allergies  Allergen Reactions   Codeine    Donnatal [Belladonna Alk-Phenobarb Er]    Librium     Prior to Admission medications   Medication Sig Start Date End Date Taking? Authorizing Provider  acetic acid 2 % otic solution Place 4 drops into both ears in the morning and at bedtime. 11/02/22  Yes Milian, Aleen Campi, FNP  fluticasone [facility]) 50 MCG/ACT nasal spray USE 2 SPRAYS IN EACH NOSTRIL DAILY 05/28/22  Yes Daphine Deutscher, Mary-Margaret, FNP  hydrochlorothiazide (MICROZIDE) 12.5 MG capsule TAKE ONE (1) CAPSULE EACH DAY 07/06/22  Yes Gottschalk, Ashly M, DO  meclizine (ANTIVERT) 25 MG tablet Take 1 tablet (25 mg total) by  mouth 3 (three) times daily as needed for dizziness. 02/26/23  Yes Delynn Flavin M, DO  triamcinolone cream (KENALOG) 0.1 % Apply 1 Application topically 2 (two) times daily as needed. 11/30/22  Yes Delynn Flavin M, DO  warfarin (COUMADIN) 5 MG tablet Take 2 tablets (10 mg total) by mouth daily. 07/06/22  Yes Gottschalk, Ashly M, DO    Height 5\' 3"  (1.6 m), weight 56.7 kg. Exam: General: Communicates without difficulty, well nourished, no acute distress. Head: Normocephalic, no evidence injury, no tenderness, facial buttresses intact without stepoff. Face/sinus: No tenderness to palpation and percussion. Facial movement is normal and symmetric. Eyes: PERRL, EOMI. No scleral icterus, conjunctivae clear. Neuro: CN II exam reveals vision grossly intact.  No nystagmus at any point of gaze. Ears: Auricles well formed without lesions.  Ear canals are intact without mass or lesion.  No erythema or edema is appreciated.  The TMs are intact without fluid. Nose: External evaluation reveals normal support and skin without lesions.  Dorsum is intact.  Anterior rhinoscopy reveals congested mucosa over anterior aspect of inferior turbinates and intact septum.  No purulence noted. Oral:  Oral cavity and oropharynx are intact, symmetric, without erythema or edema.  Mucosa is moist without lesions. Neck: Full range of motion without pain.  There is no significant lymphadenopathy.  No masses palpable.  Thyroid bed within normal limits to palpation.  Parotid glands and submandibular glands equal  bilaterally without mass.  Trachea is midline. Neuro:  CN 2-12 grossly intact.   Vestibular: Dix-Hallpike produces rotational nystagmus with left-sided positioning. Vestibular: There is no nystagmus with pneumatic pressure on either tympanic membrane or Valsalva. The cerebellar examination is unremarkable.    Procedure: Left-sided Epley maneuver  Anesthesia: None Indication: To treat the left BPPV Desciption:  The patient is  first placed in a left-sided Dix-Hallpike position. After the vertigo has subsided, the head is gradually rotated from the left to the right, completing a 180 turn. The patient is then returned to the upright position. The patient tolerated the procedure well without any difficulty.    Assessment: 1.  Recurrent dizziness, secondary to left benign paroxysmal positional vertigo. 2.  Left Dix-Hallpike maneuver is positive today. 3.  The patient's ear canals, tympanic membranes, and middle ear spaces are all normal.  Plan: 1.  The physical exam findings are reviewed with the patient. 2.  The pathophysiology of benign paroxysmal positional vertigo is extensively discussed with the patient.  Questions are invited and answered. 3.  The left Epley maneuver is performed today without difficulty. 4.  The post Epley activity restrictions are discussed. 5.  The patient will return for reevaluation in 4 weeks, sooner if needed.  [name] 03/10/2023, 5:51 PM

## 2023-03-15 ENCOUNTER — Ambulatory Visit: Payer: Medicare Other

## 2023-03-19 ENCOUNTER — Ambulatory Visit (INDEPENDENT_AMBULATORY_CARE_PROVIDER_SITE_OTHER): Payer: Medicare Other

## 2023-03-19 DIAGNOSIS — Z23 Encounter for immunization: Secondary | ICD-10-CM | POA: Diagnosis not present

## 2023-03-20 DIAGNOSIS — R002 Palpitations: Secondary | ICD-10-CM | POA: Diagnosis not present

## 2023-03-20 DIAGNOSIS — R42 Dizziness and giddiness: Secondary | ICD-10-CM | POA: Diagnosis not present

## 2023-03-22 NOTE — Addendum Note (Signed)
Addended by: Sonny Masters on: 03/22/2023 01:08 PM   Modules accepted: Orders

## 2023-04-02 ENCOUNTER — Ambulatory Visit: Payer: Medicare Other

## 2023-04-02 NOTE — Progress Notes (Signed)
Patient presents today to pick up custom molded foot orthotics, diagnosed with Posterior tibial tendonitis by Dr. Charlsie Merles.   Orthotics were dispensed and fit was satisfactory. Reviewed instructions for break-in and wear. Written instructions given to patient.  Patient will follow up as needed.  Addison Bailey CPed, CFo, CFm

## 2023-04-05 ENCOUNTER — Ambulatory Visit (INDEPENDENT_AMBULATORY_CARE_PROVIDER_SITE_OTHER): Payer: Medicare Other | Admitting: Otolaryngology

## 2023-04-05 ENCOUNTER — Encounter (INDEPENDENT_AMBULATORY_CARE_PROVIDER_SITE_OTHER): Payer: Self-pay

## 2023-04-05 VITALS — Ht 63.0 in | Wt 124.0 lb

## 2023-04-05 DIAGNOSIS — R42 Dizziness and giddiness: Secondary | ICD-10-CM

## 2023-04-08 NOTE — Progress Notes (Signed)
Patient ID: [name], female   DOB: 08/04/1955, 67 y.o.   MRN: [id number]  Follow-up: Recurrent dizziness  HPI: The patient is a 67 year old female who returns today for her follow-up evaluation.  The patient was last seen 1 month ago.  At that time, she was complaining of recurrent dizziness.  Her left Dix-Hallpike maneuver was positive, consistent with left benign paroxysmal positional vertigo.  She was treated with the left Epley maneuver.  The patient returns today reporting resolution of her dizziness.  Currently she denies any otalgia, otorrhea, or vertigo.  She also denies any change in her hearing.  Exam: General: Communicates without difficulty, well nourished, no acute distress. Head: Normocephalic, no evidence injury, no tenderness, facial buttresses intact without stepoff. Face/sinus: No tenderness to palpation and percussion. Facial movement is normal and symmetric. Eyes: PERRL, EOMI. No scleral icterus, conjunctivae clear. Neuro: CN II exam reveals vision grossly intact.  No nystagmus at any point of gaze. Ears: Auricles well formed without lesions.  Ear canals are intact without mass or lesion.  No erythema or edema is appreciated.  The TMs are intact without fluid. Nose: External evaluation reveals normal support and skin without lesions.  Dorsum is intact.  Anterior rhinoscopy reveals normal mucosa over anterior aspect of inferior turbinates and intact septum.  No purulence noted. Oral:  Oral cavity and oropharynx are intact, symmetric, without erythema or edema.  Mucosa is moist without lesions. Neck: Full range of motion without pain.  There is no significant lymphadenopathy.  No masses palpable.  Thyroid bed within normal limits to palpation.  Parotid glands and submandibular glands equal bilaterally without mass.  Trachea is midline. Neuro:  CN 2-12 grossly intact.  Her Dix-Hallpike is negative.  Assessment: 1.  The patient's dizziness is currently under control status post left  Epley maneuver. 2.  Her ear canals, tympanic membranes, and middle ear spaces are normal.  Plan: 1.  The physical exam findings are reviewed with the patient. 2.  The pathophysiology of benign paroxysmal positional vertigo is discussed with the patient.  Questions are invited and answered. 3.  The patient is encouraged to call with any questions or concerns.

## 2023-04-22 NOTE — Progress Notes (Unsigned)
  Cardiology Office Note:  .   Date:  04/24/2023  ID:  [name], DOB 1956/01/18, MRN [id number] PCP: Raliegh Ip, DO  [facility] Providers Cardiologist:  None { History of Present Illness: .   [name] is a 67 y.o. female with history of Factor V Leiden who presents for the evaluation of dizziness/palpitations at the request of Raliegh Ip, DO.  History of Present Illness   [name], a 67 year old female with a history of Factor V Leiden, hypertension, and two previous strokes, presents for evaluation of supraventricular tachycardia (SVT). She was recently evaluated by her primary care physician for SVT and was found to have brief episodes of SVT on monitoring, lasting around four beats. The episodes were triggered three weeks ago following an episode of vertigo and subsequent treatment by an ENT specialist. She experienced an abnormal increase in heart rate while at the gym, which led her to seek medical attention. An EKG performed by her primary care physician was normal. She suspects that the introduction of caffeine into her diet (switching from decaf to half-caf coffee) may have triggered the SVT episodes. Since reverting back to decaf coffee, she reports no further issues. No CP or SOB. CV exam normal. No tobacco or etoh. Retired.       HGB 14.5 TSH 3.3     Problem List SVT -brief episodes, longest 4 beat duration  2. Factor V Leiden  3. CVA 4. HTN    ROS: All other ROS reviewed and negative. Pertinent positives noted in the HPI.     Studies Reviewed: Marland Kitchen       Physical Exam:   VS:  BP 134/75 (BP Location: Left Arm, Patient Position: Sitting, Cuff Size: Normal)   Pulse 70   Ht 5\' 1"  (1.549 m)   Wt 128 lb 12.8 oz (58.4 kg)   SpO2 98%   BMI 24.34 kg/m    Wt Readings from Last 3 Encounters:  04/24/23 128 lb 12.8 oz (58.4 kg)  04/05/23 124 lb (56.2 kg)  03/07/23 127 lb 3.2 oz (57.7 kg)    GEN: Well nourished, well developed in no acute  distress NECK: No JVD; No carotid bruits CARDIAC: RRR, no murmurs, rubs, gallops RESPIRATORY:  Clear to auscultation without rales, wheezing or rhonchi  ABDOMEN: Soft, non-tender, non-distended EXTREMITIES:  No edema; No deformity  ASSESSMENT AND PLAN: .   Assessment and Plan    Supraventricular Tachycardia (SVT) Brief episodes of SVT noted on monitor, longest lasting four beats. Symptoms improved with reduction in caffeine intake. No current distress or ongoing symptoms. -labs normal including TSH.  -Order echocardiogram to ensure structural normality of the heart. -Continue current lifestyle modifications including reduced caffeine intake. -Return for follow-up as needed.  Hypertension Well controlled on current medication. -Continue current antihypertensive medication.  Factor V Leiden with history of strokes On anticoagulation therapy with Coumadin. -Continue Coumadin as prescribed.              Follow-up: Return if symptoms worsen or fail to improve.  Signed, Lenna Gilford. Flora Lipps, MD, [facility]  [address] [place], [place] [address] [phone]  10:30 AM

## 2023-04-23 DIAGNOSIS — Z6824 Body mass index (BMI) 24.0-24.9, adult: Secondary | ICD-10-CM | POA: Diagnosis not present

## 2023-04-23 DIAGNOSIS — Z1231 Encounter for screening mammogram for malignant neoplasm of breast: Secondary | ICD-10-CM | POA: Diagnosis not present

## 2023-04-24 ENCOUNTER — Ambulatory Visit: Payer: Medicare Other | Attending: Cardiovascular Disease | Admitting: Cardiovascular Disease

## 2023-04-24 ENCOUNTER — Encounter (INDEPENDENT_AMBULATORY_CARE_PROVIDER_SITE_OTHER): Payer: Self-pay

## 2023-04-24 ENCOUNTER — Encounter: Payer: Self-pay | Admitting: Cardiovascular Disease

## 2023-04-24 ENCOUNTER — Ambulatory Visit (INDEPENDENT_AMBULATORY_CARE_PROVIDER_SITE_OTHER): Payer: Medicare Other | Admitting: Otolaryngology

## 2023-04-24 VITALS — Ht 61.0 in | Wt 128.0 lb

## 2023-04-24 VITALS — BP 134/75 | HR 70 | Ht 61.0 in | Wt 128.8 lb

## 2023-04-24 DIAGNOSIS — R002 Palpitations: Secondary | ICD-10-CM

## 2023-04-24 DIAGNOSIS — H6123 Impacted cerumen, bilateral: Secondary | ICD-10-CM | POA: Insufficient documentation

## 2023-04-24 DIAGNOSIS — I471 Supraventricular tachycardia, unspecified: Secondary | ICD-10-CM | POA: Diagnosis not present

## 2023-04-24 DIAGNOSIS — H608X3 Other otitis externa, bilateral: Secondary | ICD-10-CM | POA: Diagnosis not present

## 2023-04-24 NOTE — Progress Notes (Signed)
Patient ID: [name], female   DOB: 04/25/1956, 67 y.o.   MRN: [id number]  New complaint: Itchy and clogging sensation sensation in ears  HPI: The patient is a 67 year old female who presents today with a new complaint of itchy and clogging sensation in her ears.  The patient was previously seen for recurrent dizziness.  She was noted to have left benign paroxysmal positional vertigo.  She was successfully treated with a left Epley maneuver.  The patient returns today reporting resolution of her dizziness.  However, she has noted increasing itchy and clogging sensation in her ears.  She denies any significant otalgia, otorrhea, or recent change in her hearing.  Exam: General: Communicates without difficulty, well nourished, no acute distress. Head: Normocephalic, no evidence injury, no tenderness, facial buttresses intact without stepoff. Face/sinus: No tenderness to palpation and percussion. Facial movement is normal and symmetric. Eyes: PERRL, EOMI. No scleral icterus, conjunctivae clear. Neuro: CN II exam reveals vision grossly intact.  No nystagmus at any point of gaze. Ears: Auricles well formed without lesions.  Bilateral cerumen impaction.  Nose: External evaluation reveals normal support and skin without lesions.  Dorsum is intact.  Anterior rhinoscopy reveals congested mucosa over anterior aspect of inferior turbinates and intact septum.  No purulence noted. Oral:  Oral cavity and oropharynx are intact, symmetric, without erythema or edema.  Mucosa is moist without lesions. Neck: Full range of motion without pain.  There is no significant lymphadenopathy.  No masses palpable.  Thyroid bed within normal limits to palpation.  Parotid glands and submandibular glands equal bilaterally without mass.  Trachea is midline. Neuro:  CN 2-12 grossly intact.   Procedure: Bilateral cerumen disimpaction Anesthesia: None Description: Under the operating microscope, the cerumen is carefully removed with a  combination of cerumen currette, alligator forceps, and suction catheters.  After the cerumen is removed, the TMs are noted to be normal.  Eczematous changes are noted in her ear canals.  No mass, erythema, or lesions. The patient tolerated the procedure well.    Assessment: 1.  Bilateral cerumen impaction.  After the cerumen disimpaction procedure, eczematous changes are noted in both ear canals.  Her tympanic membranes and middle ear spaces are noted to be normal. 2.  Bilateral chronic eczematous otitis externa.  Plan: 1.  Otomicroscopy with bilateral cerumen disimpaction. 2.  The physical exam findings are reviewed with the patient. 3.  Elocon cream to treat the chronic eczematous otitis externa. 4.  The patient is encouraged to call with any questions or concerns.

## 2023-04-24 NOTE — Patient Instructions (Signed)
Medication Instructions:  Your physician recommends that you continue on your current medications as directed. Please refer to the Current Medication list given to you today.    *If you need a refill on your cardiac medications before your next appointment, please call your pharmacy*   Lab Work: NONE    If you have labs (blood work) drawn today and your tests are completely normal, you will receive your results only by: MyChart Message (if you have MyChart) OR A paper copy in the mail If you have any lab test that is abnormal or we need to change your treatment, we will call you to review the results.   Testing/Procedures:  Echo will be scheduled at [address].  Your physician has requested that you have an echocardiogram. Echocardiography is a painless test that uses sound waves to create images of your heart. It provides your doctor with information about the size and shape of your heart and how well your heart's chambers and valves are working. This procedure takes approximately one hour. There are no restrictions for this procedure. Please do NOT wear cologne, perfume, aftershave, or lotions (deodorant is allowed). Please arrive 15 minutes prior to your appointment time.    Follow-Up: At [facility], you and your health needs are our priority.  As part of our continuing mission to provide you with exceptional heart care, we have created designated Provider Care Teams.  These Care Teams include your primary Cardiologist (physician) and Advanced Practice Providers (APPs -  Physician Assistants and Nurse Practitioners) who all work together to provide you with the care you need, when you need it.  We recommend signing up for the patient portal called "MyChart".  Sign up information is provided on this After Visit Summary.  MyChart is used to connect with patients for Virtual Visits (Telemedicine).  Patients are able to view lab/test results, encounter notes, upcoming  appointments, etc.  Non-urgent messages can be sent to your provider as well.   To learn more about what you can do with MyChart, go to [email or web address].    Your next appointment:   Follow up as needed pending results of echo    Provider:   Lennie Odor, MD   Other Instructions

## 2023-04-26 ENCOUNTER — Other Ambulatory Visit: Payer: Self-pay | Admitting: Neurosurgery

## 2023-04-26 DIAGNOSIS — Z803 Family history of malignant neoplasm of breast: Secondary | ICD-10-CM

## 2023-05-03 ENCOUNTER — Encounter: Payer: Self-pay | Admitting: Family Medicine

## 2023-05-03 ENCOUNTER — Ambulatory Visit (INDEPENDENT_AMBULATORY_CARE_PROVIDER_SITE_OTHER): Payer: Medicare Other | Admitting: Family Medicine

## 2023-05-03 VITALS — BP 146/81 | HR 69 | Temp 97.9°F | Ht 61.0 in | Wt 130.8 lb

## 2023-05-03 DIAGNOSIS — D6851 Activated protein C resistance: Secondary | ICD-10-CM | POA: Diagnosis not present

## 2023-05-03 DIAGNOSIS — Z7901 Long term (current) use of anticoagulants: Secondary | ICD-10-CM

## 2023-05-03 LAB — COAGUCHEK XS/INR WAIVED
INR: 3.4 — ABNORMAL HIGH (ref 0.9–1.1)
Prothrombin Time: 40.2 s

## 2023-05-03 NOTE — Progress Notes (Signed)
Subjective: CC: INR check PCP: Raliegh Ip, DO HPI:[name] is a 67 y.o. female presenting to clinic today for:  1.  Factor V Leyden mutation Goal INR 2-3 Patient is compliant with 10 mg of Coumadin daily.  No bleeding.  She admits that she did not eat her normal diet of spinach daily this week.   ROS: Per HPI  Allergies  Allergen Reactions   Codeine    Donnatal [Belladonna Alk-Phenobarb Er]    Librium    Past Medical History:  Diagnosis Date   Anemia    Factor V Leiden (HCC)    Hypertension    Stroke (HCC)     Current Outpatient Medications:    acetic acid 2 % otic solution, Place 4 drops into both ears in the morning and at bedtime., Disp: 15 mL, Rfl: 0   fluticasone (FLONASE) 50 MCG/ACT nasal spray, USE 2 SPRAYS IN EACH NOSTRIL DAILY, Disp: 16 g, Rfl: 6   hydrochlorothiazide (MICROZIDE) 12.5 MG capsule, TAKE ONE (1) CAPSULE EACH DAY, Disp: 90 capsule, Rfl: 3   meclizine (ANTIVERT) 25 MG tablet, Take 1 tablet (25 mg total) by mouth 3 (three) times daily as needed for dizziness. (Patient not taking: Reported on 04/24/2023), Disp: 30 tablet, Rfl: 0   triamcinolone cream (KENALOG) 0.1 %, Apply 1 Application topically 2 (two) times daily as needed., Disp: 30 g, Rfl: 1   warfarin (COUMADIN) 5 MG tablet, Take 2 tablets (10 mg total) by mouth daily., Disp: 180 tablet, Rfl: 3 Social History   Socioeconomic History   Marital status: Married    Spouse name: Not on file   Number of children: 2   Years of education: Not on file   Highest education level: Not on file  Occupational History   Occupation: Retired Dipl[employer] Services operational officer  Tobacco Use   Smoking status: Never   Smokeless tobacco: Never  Vaping Use   Vaping status: Never Used  Substance and Sexual Activity   Alcohol use: Not Currently    Alcohol/week: 0.0 standard drinks of alcohol   Drug use: Not Currently   Sexual activity: Not on file  Other Topics Concern   Not on file  Social History Narrative   Not on  file   Social Drivers of Health   Financial Resource Strain: Not on file  Food Insecurity: Not on file  Transportation Needs: Not on file  Physical Activity: Not on file  Stress: Not on file  Social Connections: Not on file  Intimate Partner Violence: Not on file   Family History  Problem Relation Age of Onset   Stroke Mother    Cancer Father    Breast cancer Sister 52   Diabetes Brother     Objective: Office vital signs reviewed. BP (!) 146/81   Pulse 69   Temp 97.9 F (36.6 C)   Ht 5\' 1"  (1.549 m)   Wt 130 lb 12.8 oz (59.3 kg)   SpO2 98%   BMI 24.71 kg/m   Physical Examination:  General: Awake, alert, well nourished, No acute distress HEENT: sclera white, MMM Cardio: RRR Pulm: normal WOB on room air.  Assessment/ Plan: 67 y.o. female   Factor V Leiden mutation (HCC) - Plan: CoaguChek XS/INR Waived  Warfarin anticoagulation - Plan: CoaguChek XS/INR Waived  Supratherapeutic INR.  Skip today's dosing.  Resume normal diet.  Then resume 10 mg daily of Coumadin.  Will see her back next week  Will recheck BP next week.  If persistently above 140/90,  anticipate need for advancing HCTZ dosing   Raliegh Ip, DO [facility] [phone]

## 2023-05-08 ENCOUNTER — Ambulatory Visit: Payer: Medicare Other | Admitting: Family Medicine

## 2023-05-10 ENCOUNTER — Ambulatory Visit (INDEPENDENT_AMBULATORY_CARE_PROVIDER_SITE_OTHER): Payer: Medicare Other | Admitting: Family Medicine

## 2023-05-10 ENCOUNTER — Encounter: Payer: Self-pay | Admitting: Family Medicine

## 2023-05-10 VITALS — BP 135/75 | HR 77 | Temp 98.4°F | Wt 128.6 lb

## 2023-05-10 DIAGNOSIS — Z7901 Long term (current) use of anticoagulants: Secondary | ICD-10-CM | POA: Diagnosis not present

## 2023-05-10 DIAGNOSIS — D6851 Activated protein C resistance: Secondary | ICD-10-CM

## 2023-05-10 LAB — COAGUCHEK XS/INR WAIVED
INR: 2.5 — ABNORMAL HIGH (ref 0.9–1.1)
Prothrombin Time: 29.6 s

## 2023-05-10 NOTE — Progress Notes (Signed)
   Subjective: CC:inr check PCP: Emily Ip, DO HPI:[name] is a 67 y.o. female presenting to clinic today for:  1. Factor V Leiden  Goal INR 2-3. Was supratherapeutic last visit.  Here for recheck.  Compliant with medications.  Has been adding back her spinach smoothies.  No bleeding.   ROS: Per HPI  Allergies  Allergen Reactions   Codeine    Donnatal [Belladonna Alk-Phenobarb Er]    Librium    Past Medical History:  Diagnosis Date   Anemia    Factor V Leiden (HCC)    Hypertension    Stroke (HCC)     Current Outpatient Medications:    acetic acid 2 % otic solution, Place 4 drops into both ears in the morning and at bedtime., Disp: 15 mL, Rfl: 0   fluticasone (FLONASE) 50 MCG/ACT nasal spray, USE 2 SPRAYS IN EACH NOSTRIL DAILY, Disp: 16 g, Rfl: 6   hydrochlorothiazide (MICROZIDE) 12.5 MG capsule, TAKE ONE (1) CAPSULE EACH DAY, Disp: 90 capsule, Rfl: 3   meclizine (ANTIVERT) 25 MG tablet, Take 1 tablet (25 mg total) by mouth 3 (three) times daily as needed for dizziness. (Patient not taking: Reported on 05/03/2023), Disp: 30 tablet, Rfl: 0   triamcinolone cream (KENALOG) 0.1 %, Apply 1 Application topically 2 (two) times daily as needed., Disp: 30 g, Rfl: 1   warfarin (COUMADIN) 5 MG tablet, Take 2 tablets (10 mg total) by mouth daily., Disp: 180 tablet, Rfl: 3 Social History   Socioeconomic History   Marital status: Married    Spouse name: Not on file   Number of children: 2   Years of education: Not on file   Highest education level: Not on file  Occupational History   Occupation: Retired Dipl[employer] Services operational officer  Tobacco Use   Smoking status: Never    Passive exposure: Never   Smokeless tobacco: Never  Vaping Use   Vaping status: Never Used  Substance and Sexual Activity   Alcohol use: Not Currently    Alcohol/week: 0.0 standard drinks of alcohol   Drug use: Not Currently   Sexual activity: Not on file  Other Topics Concern   Not on file  Social History  Narrative   Not on file   Social Drivers of Health   Financial Resource Strain: Not on file  Food Insecurity: Not on file  Transportation Needs: Not on file  Physical Activity: Not on file  Stress: Not on file  Social Connections: Not on file  Intimate Partner Violence: Not on file   Family History  Problem Relation Age of Onset   Stroke Mother    Cancer Father    Breast cancer Sister 92   Diabetes Brother     Objective: Office vital signs reviewed. BP 135/75   Pulse 77   Temp 98.4 F (36.9 C)   Wt 128 lb 9.6 oz (58.3 kg)   SpO2 98%   BMI 24.30 kg/m   Physical Examination:  General: Awake, alert, well nourished, No acute distress HEENT: sclera white, MMM Pulm: Normal work of breathing on room air  Assessment/ Plan: 67 y.o. female   Factor V Leiden mutation (HCC) - Plan: CoaguChek XS/INR Waived  Warfarin anticoagulation - Plan: CoaguChek XS/INR Waived  INR therapeutic at 2.5.  No changes.  See her back in 2 months   Emily Behrendt Hulen Skains, DO [facility] [phone]

## 2023-05-29 ENCOUNTER — Ambulatory Visit (HOSPITAL_COMMUNITY)
Admission: RE | Admit: 2023-05-29 | Discharge: 2023-05-29 | Disposition: A | Discharge status: Home / Self Care | Payer: Medicare Other | Source: Ambulatory Visit | Attending: Internal Medicine | Admitting: Internal Medicine

## 2023-05-29 DIAGNOSIS — I351 Nonrheumatic aortic (valve) insufficiency: Secondary | ICD-10-CM | POA: Insufficient documentation

## 2023-05-29 DIAGNOSIS — I471 Supraventricular tachycardia, unspecified: Secondary | ICD-10-CM | POA: Diagnosis not present

## 2023-05-29 DIAGNOSIS — I10 Essential (primary) hypertension: Secondary | ICD-10-CM | POA: Insufficient documentation

## 2023-05-29 LAB — ECHOCARDIOGRAM COMPLETE
AR max vel: 1.72 cm2
AV Area VTI: 1.65 cm2
AV Area mean vel: 1.59 cm2
AV Mean grad: 4 mm[Hg]
AV Peak grad: 8 mm[Hg]
Ao pk vel: 1.41 m/s
Area-P 1/2: 2.87 cm2
MV M vel: 2.84 m/s
MV Peak grad: 32.3 mm[Hg]
P 1/2 time: 623 ms
S' Lateral: 1.95 cm

## 2023-05-30 ENCOUNTER — Telehealth: Payer: Self-pay

## 2023-05-30 NOTE — Telephone Encounter (Signed)
 Spoke to pt about adjusting inserts

## 2023-06-25 ENCOUNTER — Ambulatory Visit: Payer: Medicare Other

## 2023-06-25 NOTE — Progress Notes (Signed)
Squeaky inserts when in Sneaker  I narrowed the heel on orthotic and squeak is now gone

## 2023-07-07 ENCOUNTER — Other Ambulatory Visit: Payer: Self-pay | Admitting: Family Medicine

## 2023-07-07 DIAGNOSIS — D6851 Activated protein C resistance: Secondary | ICD-10-CM

## 2023-07-08 ENCOUNTER — Telehealth: Payer: Self-pay

## 2023-07-08 DIAGNOSIS — D6851 Activated protein C resistance: Secondary | ICD-10-CM

## 2023-07-08 NOTE — Telephone Encounter (Signed)
 Copied from CRM [id number]. Topic: Clinical - Prescription Issue >> Jul 08, 2023  3:26 PM Victorino Dike T wrote: Reason for CRM: warfarin (COUMADIN) 5 MG tablet refill was denied, please call [phone]

## 2023-07-09 ENCOUNTER — Ambulatory Visit (INDEPENDENT_AMBULATORY_CARE_PROVIDER_SITE_OTHER): Payer: Medicare Other | Admitting: Family Medicine

## 2023-07-09 ENCOUNTER — Encounter: Payer: Self-pay | Admitting: Family Medicine

## 2023-07-09 VITALS — BP 147/82 | HR 78 | Temp 98.6°F | Ht 61.0 in | Wt 126.0 lb

## 2023-07-09 DIAGNOSIS — Z5181 Encounter for therapeutic drug level monitoring: Secondary | ICD-10-CM | POA: Diagnosis not present

## 2023-07-09 DIAGNOSIS — Z7901 Long term (current) use of anticoagulants: Secondary | ICD-10-CM | POA: Diagnosis not present

## 2023-07-09 DIAGNOSIS — D6851 Activated protein C resistance: Secondary | ICD-10-CM | POA: Diagnosis not present

## 2023-07-09 LAB — COAGUCHEK XS/INR WAIVED
INR: 2.6 — ABNORMAL HIGH (ref 0.9–1.1)
Prothrombin Time: 31.3 s

## 2023-07-09 MED ORDER — WARFARIN SODIUM 5 MG PO TABS: 10.0000 mg | ORAL_TABLET | Freq: Every day | ORAL | 0 refills | Status: DC

## 2023-07-09 NOTE — Addendum Note (Signed)
 Addended by: Julious Payer D on: 07/09/2023 09:40 AM   Modules accepted: Orders

## 2023-07-09 NOTE — Progress Notes (Signed)
 Subjective: CC: INR check PCP: Raliegh Ip, DO HPI:[name] is a 68 y.o. female presenting to clinic today for:  1.  Factor V Leyden mutation Goal INR 2-3 Patient reports compliance with Coumadin.  No missed doses.  No changes in diet.  Reports no bleeding.  Continues to stay very busy with her grandchild and family   ROS: Per HPI  Allergies  Allergen Reactions   Codeine    Donnatal [Belladonna Alk-Phenobarb Er]    Librium    Past Medical History:  Diagnosis Date   Anemia    Factor V Leiden (HCC)    Hypertension    Stroke (HCC)     Current Outpatient Medications:    acetic acid 2 % otic solution, Place 4 drops into both ears in the morning and at bedtime., Disp: 15 mL, Rfl: 0   fluticasone (FLONASE) 50 MCG/ACT nasal spray, USE 2 SPRAYS IN EACH NOSTRIL DAILY, Disp: 16 g, Rfl: 6   hydrochlorothiazide (MICROZIDE) 12.5 MG capsule, TAKE ONE (1) CAPSULE EACH DAY, Disp: 90 capsule, Rfl: 3   meclizine (ANTIVERT) 25 MG tablet, Take 1 tablet (25 mg total) by mouth 3 (three) times daily as needed for dizziness., Disp: 30 tablet, Rfl: 0   triamcinolone cream (KENALOG) 0.1 %, Apply 1 Application topically 2 (two) times daily as needed., Disp: 30 g, Rfl: 1   warfarin (COUMADIN) 5 MG tablet, Take 2 tablets (10 mg total) by mouth daily., Disp: 180 tablet, Rfl: 0 Social History   Socioeconomic History   Marital status: Married    Spouse name: Not on file   Number of children: 2   Years of education: Not on file   Highest education level: Not on file  Occupational History   Occupation: Retired Dipl[employer] Services operational officer  Tobacco Use   Smoking status: Never    Passive exposure: Never   Smokeless tobacco: Never  Vaping Use   Vaping status: Never Used  Substance and Sexual Activity   Alcohol use: Not Currently    Alcohol/week: 0.0 standard drinks of alcohol   Drug use: Not Currently   Sexual activity: Not on file  Other Topics Concern   Not on file  Social History Narrative   Not  on file   Social Drivers of Health   Financial Resource Strain: Not on file  Food Insecurity: Not on file  Transportation Needs: Not on file  Physical Activity: Not on file  Stress: Not on file  Social Connections: Not on file  Intimate Partner Violence: Not on file   Family History  Problem Relation Age of Onset   Stroke Mother    Cancer Father    Breast cancer Sister 69   Diabetes Brother     Objective: Office vital signs reviewed. BP (!) 147/82   Pulse 78   Temp 98.6 F (37 C)   Ht 5\' 1"  (1.549 m)   Wt 126 lb (57.2 kg)   SpO2 97%   BMI 23.81 kg/m   Physical Examination:  General: Awake, alert, well nourished, No acute distress HEENT: Sclera white.  Moist mucous membranes Cardio: regular rate and rhythm, S1S2 heard, no murmurs appreciated Pulm: clear to auscultation bilaterally, no wheezes, rhonchi or rales; normal work of breathing on room air    Assessment/ Plan: 68 y.o. female   Factor V Leiden mutation (HCC) - Plan: CoaguChek XS/INR Waived  Anticoagulation goal of INR 2 to 3 - Plan: CoaguChek XS/INR Waived  INR therapeutic today at 2.6.  No changes.  May follow-up in 2 months, sooner if concerns arise   Raliegh Ip, DO [facility] [phone]

## 2023-07-09 NOTE — Telephone Encounter (Signed)
 Pt aware refill sent to pharmacy

## 2023-07-10 ENCOUNTER — Ambulatory Visit: Payer: Medicare Other | Admitting: Family Medicine

## 2023-07-12 ENCOUNTER — Other Ambulatory Visit: Payer: Medicare Other

## 2023-07-15 ENCOUNTER — Other Ambulatory Visit: Payer: Self-pay | Admitting: Nurse Practitioner

## 2023-07-17 ENCOUNTER — Ambulatory Visit (INDEPENDENT_AMBULATORY_CARE_PROVIDER_SITE_OTHER): Payer: Medicare Other | Admitting: Nurse Practitioner

## 2023-07-17 ENCOUNTER — Encounter: Payer: Self-pay | Admitting: Nurse Practitioner

## 2023-07-17 VITALS — BP 133/76 | HR 79 | Temp 97.5°F | Ht 61.0 in | Wt 128.0 lb

## 2023-07-17 DIAGNOSIS — N3000 Acute cystitis without hematuria: Secondary | ICD-10-CM | POA: Insufficient documentation

## 2023-07-17 DIAGNOSIS — R300 Dysuria: Secondary | ICD-10-CM | POA: Diagnosis not present

## 2023-07-17 LAB — MICROSCOPIC EXAMINATION
Epithelial Cells (non renal): NONE SEEN /HPF (ref 0–10)
Renal Epithel, UA: NONE SEEN /HPF

## 2023-07-17 LAB — URINALYSIS, ROUTINE W REFLEX MICROSCOPIC
Bilirubin, UA: NEGATIVE
Glucose, UA: NEGATIVE
Nitrite, UA: NEGATIVE
Protein,UA: NEGATIVE
Specific Gravity, UA: 1.015 (ref 1.005–1.030)
Urobilinogen, Ur: 0.2 mg/dL (ref 0.2–1.0)
pH, UA: 7.5 (ref 5.0–7.5)

## 2023-07-17 MED ORDER — DOXYCYCLINE HYCLATE 100 MG PO CAPS: 100.0000 mg | ORAL_CAPSULE | Freq: Two times a day (BID) | ORAL | 0 refills | Status: DC

## 2023-07-17 NOTE — Progress Notes (Signed)
 Acute Office Visit  Subjective:     Patient ID: [name], female    DOB: January 02, 1956, 68 y.o.   MRN: [id number]  Chief Complaint  Patient presents with   Dysuria    Started this morning     HPI [name] is a 68 y.o. female who complains of urinary frequency, urgency and dysuria x 1 days, without flank pain, fever, chills, or abnormal vaginal discharge or bleeding.   Active Ambulatory Problems    Diagnosis Date Noted   Factor V Leiden mutation (HCC) 08/28/2012   Perennial allergic rhinitis with seasonal variation 12/29/2015   H/O: CVA (cerebrovascular accident) 03/06/2018   Warfarin anticoagulation 08/12/2018   Hypertension, essential 11/05/2018   Osteoporosis 04/05/2021   Pure hypercholesterolemia 10/29/2022   Elevated serum glucose 10/29/2022   History of CVA in adulthood 10/29/2022   Full code status 02/15/2023   Dizziness 03/10/2023   Benign paroxysmal vertigo of left ear 03/10/2023   Impacted cerumen of both ears 04/24/2023   Chronic eczematous otitis externa of both ears 04/24/2023   Acute cystitis without hematuria 07/17/2023   Dysuria 07/17/2023   Resolved Ambulatory Problems    Diagnosis Date Noted   No Resolved Ambulatory Problems   Past Medical History:  Diagnosis Date   Anemia    Factor V Leiden (HCC)    Hypertension    Stroke (HCC)      ROS Negative unless indicated in HPI    Objective:    BP 133/76   Pulse 79   Temp (!) 97.5 F (36.4 C) (Temporal)   Ht 5\' 1"  (1.549 m)   Wt 128 lb (58.1 kg)   SpO2 97%   BMI 24.19 kg/m  BP Readings from Last 3 Encounters:  07/17/23 133/76  07/09/23 (!) 147/82  05/10/23 135/75   Wt Readings from Last 3 Encounters:  07/17/23 128 lb (58.1 kg)  07/09/23 126 lb (57.2 kg)  05/10/23 128 lb 9.6 oz (58.3 kg)      Physical Exam Appears well, in no apparent distress.  Vital signs are normal. The abdomen is soft without tenderness, guarding, mass, rebound or organomegaly. No CVA tenderness or  inguinal adenopathy noted.  Urine dipstick shows positive for RBC's, positive for leukocytes, and positive for ketones.  Micro exam: 11- 30 WBC's per HPF, 0-2 RBC's per HPF, moderate + bacteria, and yeast present.  No results found for any visits on 07/17/23.      Assessment & Plan:  Dysuria -     Urinalysis, Routine w reflex microscopic -     Urine Culture  Acute cystitis without hematuria -     Urine Culture  Other orders -     Doxycycline Hyclate; Take 1 capsule (100 mg total) by mouth 2 (two) times daily.  Dispense: 14 capsule; Refill: 0  And is a 68 year old Caucasian female seen today for acute cystitis: No acute distress Acute cystitis will treat with broad-spectrum antibiotic doxycycline 100 mg twice daily for 7 days #14 dispense while waiting for urine culture to come back.  Client understand based on urine culture no antibiotic may be needed Increase hydration, may use Pyridium OTC prn. Call or return to clinic prn if these symptoms worsen or fail to improve as anticipated.  The above assessment and management plan was discussed with the patient. The patient verbalized understanding of and has agreed to the management plan. Patient is aware to call the clinic if they develop any new symptoms or if symptoms persist  or worsen. Patient is aware when to return to the clinic for a follow-up visit. Patient educated on when it is appropriate to go to the emergency department.  Return if symptoms worsen or fail to improve.  Arrie Aran Santa Lighter, [facility] [address] [place], [place] [address] [phone]  Note: This document was prepared by [employer] voice dictation technology and any errors that results from this process are unintentional.

## 2023-07-21 ENCOUNTER — Other Ambulatory Visit: Payer: Self-pay | Admitting: Family Medicine

## 2023-07-21 DIAGNOSIS — I10 Essential (primary) hypertension: Secondary | ICD-10-CM

## 2023-07-21 LAB — URINE CULTURE

## 2023-07-30 ENCOUNTER — Other Ambulatory Visit: Payer: Medicare Other

## 2023-08-20 ENCOUNTER — Other Ambulatory Visit: Payer: Self-pay | Admitting: Obstetrics and Gynecology

## 2023-08-20 ENCOUNTER — Encounter: Payer: Self-pay | Admitting: Obstetrics and Gynecology

## 2023-08-20 ENCOUNTER — Ambulatory Visit
Admission: RE | Admit: 2023-08-20 | Discharge: 2023-08-20 | Disposition: A | Discharge status: Home / Self Care | Payer: Medicare Other | Source: Ambulatory Visit | Attending: Obstetrics & Gynecology | Admitting: Obstetrics & Gynecology

## 2023-08-20 DIAGNOSIS — R928 Other abnormal and inconclusive findings on diagnostic imaging of breast: Secondary | ICD-10-CM

## 2023-08-20 DIAGNOSIS — R9389 Abnormal findings on diagnostic imaging of other specified body structures: Secondary | ICD-10-CM

## 2023-08-20 DIAGNOSIS — Z803 Family history of malignant neoplasm of breast: Secondary | ICD-10-CM | POA: Diagnosis not present

## 2023-08-20 MED ORDER — GADOPICLENOL 0.5 MMOL/ML IV SOLN
5.0000 mL | Freq: Once | INTRAVENOUS | Status: AC | PRN
  Administered 2023-08-20: 5 mL via INTRAVENOUS

## 2023-08-26 ENCOUNTER — Ambulatory Visit
Admission: RE | Admit: 2023-08-26 | Discharge: 2023-08-26 | Disposition: A | Discharge status: Home / Self Care | Source: Ambulatory Visit | Attending: Obstetrics and Gynecology | Admitting: Obstetrics and Gynecology

## 2023-08-26 DIAGNOSIS — N6012 Diffuse cystic mastopathy of left breast: Secondary | ICD-10-CM | POA: Diagnosis not present

## 2023-08-26 DIAGNOSIS — R928 Other abnormal and inconclusive findings on diagnostic imaging of breast: Secondary | ICD-10-CM

## 2023-08-26 DIAGNOSIS — R92342 Mammographic extreme density, left breast: Secondary | ICD-10-CM | POA: Diagnosis not present

## 2023-08-26 DIAGNOSIS — N6323 Unspecified lump in the left breast, lower outer quadrant: Secondary | ICD-10-CM | POA: Diagnosis not present

## 2023-08-26 MED ORDER — GADOPICLENOL 0.5 MMOL/ML IV SOLN
5.0000 mL | Freq: Once | INTRAVENOUS | Status: AC | PRN
  Administered 2023-08-26: 5 mL via INTRAVENOUS

## 2023-08-27 ENCOUNTER — Ambulatory Visit (INDEPENDENT_AMBULATORY_CARE_PROVIDER_SITE_OTHER): Admitting: Family Medicine

## 2023-08-27 ENCOUNTER — Ambulatory Visit: Admitting: Family Medicine

## 2023-08-27 ENCOUNTER — Encounter: Payer: Self-pay | Admitting: Family Medicine

## 2023-08-27 VITALS — BP 147/80 | HR 74 | Temp 98.4°F | Ht 61.0 in | Wt 129.2 lb

## 2023-08-27 DIAGNOSIS — Z7901 Long term (current) use of anticoagulants: Secondary | ICD-10-CM | POA: Diagnosis not present

## 2023-08-27 DIAGNOSIS — Z9889 Other specified postprocedural states: Secondary | ICD-10-CM

## 2023-08-27 DIAGNOSIS — D6851 Activated protein C resistance: Secondary | ICD-10-CM | POA: Diagnosis not present

## 2023-08-27 LAB — COAGUCHEK XS/INR WAIVED
INR: 2.8 — ABNORMAL HIGH (ref 0.9–1.1)
Prothrombin Time: 34 s

## 2023-08-27 LAB — SURGICAL PATHOLOGY

## 2023-08-27 NOTE — Progress Notes (Signed)
 Subjective: CC: Status post breast biopsy PCP: Raliegh Ip, DO HPI:[name] is a 68 y.o. female presenting to clinic today for:  1.  Status post left-sided breast biopsy She had a MRI guided biopsy of her left breast yesterday.  She notes that she was having some ongoing bleeding from her breast and that the Steri-Strip that was placed on there yesterday fell off.  She was concerned about this because she is anticoagulated.  She reports no significant pain.  She has been keeping a bandage on this area.  Wanted to evaluated it today to make sure that she is not having any complications  2.  Factor V Leyden mutation She is compliant with her Coumadin.  She reports some bleeding as above but otherwise no concerns.   ROS: Per HPI  Allergies  Allergen Reactions   Codeine    Donnatal [Belladonna Alk-Phenobarb Er]    Librium    Past Medical History:  Diagnosis Date   Anemia    Factor V Leiden (HCC)    Hypertension    Stroke (HCC)     Current Outpatient Medications:    acetic acid 2 % otic solution, Place 4 drops into both ears in the morning and at bedtime., Disp: 15 mL, Rfl: 0   doxycycline (VIBRAMYCIN) 100 MG capsule, Take 1 capsule (100 mg total) by mouth 2 (two) times daily., Disp: 14 capsule, Rfl: 0   fluticasone (FLONASE) 50 MCG/ACT nasal spray, USE 2 SPRAYS IN EACH NOSTRIL DAILY, Disp: 16 g, Rfl: 6   hydrochlorothiazide (MICROZIDE) 12.5 MG capsule, TAKE ONE (1) CAPSULE EACH DAY, Disp: 90 capsule, Rfl: 0   meclizine (ANTIVERT) 25 MG tablet, Take 1 tablet (25 mg total) by mouth 3 (three) times daily as needed for dizziness., Disp: 30 tablet, Rfl: 0   triamcinolone cream (KENALOG) 0.1 %, Apply 1 Application topically 2 (two) times daily as needed., Disp: 30 g, Rfl: 1   warfarin (COUMADIN) 5 MG tablet, Take 2 tablets (10 mg total) by mouth daily., Disp: 180 tablet, Rfl: 0 Social History   Socioeconomic History   Marital status: Married    Spouse name: Not on file    Number of children: 2   Years of education: Not on file   Highest education level: Not on file  Occupational History   Occupation: Retired [employer] operational officer  Tobacco Use   Smoking status: Never    Passive exposure: Never   Smokeless tobacco: Never  Vaping Use   Vaping status: Never Used  Substance and Sexual Activity   Alcohol use: Not Currently    Alcohol/week: 0.0 standard drinks of alcohol   Drug use: Not Currently   Sexual activity: Not on file  Other Topics Concern   Not on file  Social History Narrative   Not on file   Social Drivers of Health   Financial Resource Strain: Not on file  Food Insecurity: Not on file  Transportation Needs: Not on file  Physical Activity: Not on file  Stress: Not on file  Social Connections: Not on file  Intimate Partner Violence: Not on file   Family History  Problem Relation Age of Onset   Stroke Mother    Cancer Father    Breast cancer Sister 12   Diabetes Brother     Objective: Office vital signs reviewed. BP (!) 147/80   Pulse 74   Temp 98.4 F (36.9 C)   Ht 5\' 1"  (1.549 m)   Wt 129 lb 3.2 oz (58.6  kg)   SpO2 96%   BMI 24.41 kg/m   Physical Examination:  General: Awake, alert, well nourished, No acute distress Breast: Left breast with very small pinpoint area that is postsurgical.  There is no evidence of surrounding erythema, induration.  No warmth.  No active drainage or bleeding.  Assessment/ Plan: 68 y.o. female   Factor V Leiden mutation (HCC) - Plan: CoaguChek XS/INR Waived  Warfarin anticoagulation - Plan: CoaguChek XS/INR Waived  S/P breast biopsy, left  Check INR today.  Follow-up in 6 to 8 weeks, sooner if concerns arise  Biopsy site without evidence of any complications.  I did not appreciate any active bleeding or oozing but she wished to have repeat Steri-Strips placed so this was placed for her and a waterproof bandage applied over it.  She will remove the waterproof bandage in 24 hours but may leave  the Steri-Strips on for the next 3 days and then take them off.  Continue anticoagulation as directed   Raliegh Ip, DO [facility] [phone]

## 2023-09-03 ENCOUNTER — Ambulatory Visit: Payer: Medicare Other | Admitting: Family Medicine

## 2023-09-05 ENCOUNTER — Ambulatory Visit: Admitting: Podiatry

## 2023-09-05 ENCOUNTER — Ambulatory Visit (INDEPENDENT_AMBULATORY_CARE_PROVIDER_SITE_OTHER)

## 2023-09-05 DIAGNOSIS — M21612 Bunion of left foot: Secondary | ICD-10-CM

## 2023-09-05 DIAGNOSIS — M7752 Other enthesopathy of left foot: Secondary | ICD-10-CM

## 2023-09-05 DIAGNOSIS — Q828 Other specified congenital malformations of skin: Secondary | ICD-10-CM | POA: Diagnosis not present

## 2023-09-05 DIAGNOSIS — D689 Coagulation defect, unspecified: Secondary | ICD-10-CM

## 2023-09-05 MED ORDER — TRIAMCINOLONE ACETONIDE 10 MG/ML IJ SUSP
10.0000 mg | Freq: Once | INTRAMUSCULAR | Status: AC
  Administered 2023-09-05: 10 mg via INTRA_ARTICULAR

## 2023-09-05 NOTE — Progress Notes (Signed)
 Subjective:   Patient ID: [name], female   DOB: 68 y.o.   MRN: [id number]   HPI Patient presents stating she has developed a lot of pain in the outside of the left foot with fluid buildup and has developed a lesion underneath her left foot that is been painful and she is on a blood thinner and is nervous about taking care of this herself   ROS      Objective:  Physical Exam  Neurovascular status intact with inflammation fluid fifth MPJ left very painful when pressed both on the lateral and plantar side and lesions subfourth metatarsal left lucent core painful when pressed     Assessment:  Inflammatory capsulitis left fifth MPJ with tailor's bunion and porokeratotic lesion left with high risk factor with patient on Coumadin at the current time     Plan:  H&P x-ray reviewed.  Sterile prep injected the fifth MPJ 3 mg dexamethasone Kenalog 5 mg Xylocaine and then went ahead and did a separate procedure on the fourth metatarsal debrided lesion no iatrogenic bleeding was noted applied sterile dressing reappoint to recheck  X-rays indicate elevation of the 4 5 intermetatarsal angle left with inflammation around the fifth MPJ

## 2023-09-29 ENCOUNTER — Other Ambulatory Visit: Payer: Self-pay | Admitting: Family Medicine

## 2023-09-29 DIAGNOSIS — D6851 Activated protein C resistance: Secondary | ICD-10-CM

## 2023-10-07 ENCOUNTER — Other Ambulatory Visit

## 2023-10-07 ENCOUNTER — Encounter: Payer: Self-pay | Admitting: Nurse Practitioner

## 2023-10-07 ENCOUNTER — Ambulatory Visit: Payer: Self-pay | Admitting: Nurse Practitioner

## 2023-10-07 ENCOUNTER — Ambulatory Visit: Admitting: Nurse Practitioner

## 2023-10-07 VITALS — BP 179/82 | HR 66 | Temp 97.3°F | Ht 61.0 in | Wt 129.0 lb

## 2023-10-07 DIAGNOSIS — R300 Dysuria: Secondary | ICD-10-CM

## 2023-10-07 DIAGNOSIS — N3000 Acute cystitis without hematuria: Secondary | ICD-10-CM

## 2023-10-07 LAB — MICROSCOPIC EXAMINATION
Epithelial Cells (non renal): NONE SEEN /HPF (ref 0–10)
RBC, Urine: NONE SEEN /HPF (ref 0–2)
Renal Epithel, UA: NONE SEEN /HPF
WBC, UA: >30 /HPF — AB (ref 0–5)
Yeast, UA: NONE SEEN

## 2023-10-07 LAB — URINALYSIS, ROUTINE W REFLEX MICROSCOPIC
Bilirubin, UA: NEGATIVE
Glucose, UA: NEGATIVE
Ketones, UA: NEGATIVE
Nitrite, UA: POSITIVE — AB
Protein,UA: NEGATIVE
Specific Gravity, UA: 1.01 (ref 1.005–1.030)
Urobilinogen, Ur: 0.2 mg/dL (ref 0.2–1.0)
pH, UA: 6 (ref 5.0–7.5)

## 2023-10-07 MED ORDER — NITROFURANTOIN MONOHYD MACRO 100 MG PO CAPS: 100.0000 mg | ORAL_CAPSULE | Freq: Two times a day (BID) | ORAL | 0 refills | Status: DC

## 2023-10-07 NOTE — Progress Notes (Signed)
 Subjective:    Patient ID: [name], female    DOB: 02/12/1956, 68 y.o.   MRN: [id number]   Chief Complaint: Dysuria   Dysuria  This is a new problem. The current episode started today. The problem has been waxing and waning. The quality of the pain is described as burning. The pain is at a severity of 1/10. The pain is mild. There has been no fever. She is Sexually active. Associated symptoms include frequency and urgency. Pertinent negatives include no discharge, flank pain or hesitancy. She has tried nothing for the symptoms. The treatment provided mild relief.    Patient Active Problem List   Diagnosis Date Noted   Acute cystitis without hematuria 07/17/2023   Dysuria 07/17/2023   Impacted cerumen of both ears 04/24/2023   Chronic eczematous otitis externa of both ears 04/24/2023   Dizziness 03/10/2023   Benign paroxysmal vertigo of left ear 03/10/2023   Full code status 02/15/2023   Pure hypercholesterolemia 10/29/2022   Elevated serum glucose 10/29/2022   History of CVA in adulthood 10/29/2022   Osteoporosis 04/05/2021   Hypertension, essential 11/05/2018   Warfarin anticoagulation 08/12/2018   H/O: CVA (cerebrovascular accident) 03/06/2018   Perennial allergic rhinitis with seasonal variation 12/29/2015   Factor V Leiden mutation (HCC) 08/28/2012       Review of Systems  Genitourinary:  Positive for dysuria, frequency and urgency. Negative for flank pain and hesitancy.       Objective:   Physical Exam Constitutional:      Appearance: Normal appearance.  Cardiovascular:     Rate and Rhythm: Normal rate and regular rhythm.     Heart sounds: Normal heart sounds.  Pulmonary:     Effort: Pulmonary effort is normal.     Breath sounds: Normal breath sounds.  Abdominal:     Tenderness: There is no abdominal tenderness. There is no right CVA tenderness or left CVA tenderness.  Skin:    General: Skin is warm.  Neurological:     General: No focal deficit  present.     Mental Status: She is alert and oriented to person, place, and time.  Psychiatric:        Mood and Affect: Mood normal.        Behavior: Behavior normal.    BP (!) 179/82   Pulse 66   Temp (!) 97.3 F (36.3 C) (Temporal)   Ht 5\' 1"  (1.549 m)   Wt 129 lb (58.5 kg)   SpO2 96%   BMI 24.37 kg/m   UA- positive nitrites       Assessment & Plan:  Amey Ka in today with chief complaint of Dysuria   1. Dysuria (Primary)  - Urinalysis, Routine w reflex microscopic - Urine Culture  2. Acute cystitis without hematuria Take medication as prescribe Cotton underwear Take shower not bath Cranberry juice, yogurt Force fluids AZO over the counter X2 days Culture pending RTO prn   - nitrofurantoin , macrocrystal-monohydrate, (MACROBID ) 100 MG capsule; Take 1 capsule (100 mg total) by mouth 2 (two) times daily. 1 po BId  Dispense: 14 capsule; Refill: 0    The above assessment and management plan was discussed with the patient. The patient verbalized understanding of and has agreed to the management plan. Patient is aware to call the clinic if symptoms persist or worsen. Patient is aware when to return to the clinic for a follow-up visit. Patient educated on when it is appropriate to go to the emergency department.  Mary-Margaret Gaylyn Keas, FNP

## 2023-10-07 NOTE — Patient Instructions (Signed)

## 2023-10-08 ENCOUNTER — Telehealth

## 2023-10-10 DIAGNOSIS — L570 Actinic keratosis: Secondary | ICD-10-CM | POA: Diagnosis not present

## 2023-10-10 DIAGNOSIS — Z1283 Encounter for screening for malignant neoplasm of skin: Secondary | ICD-10-CM | POA: Diagnosis not present

## 2023-10-10 DIAGNOSIS — L84 Corns and callosities: Secondary | ICD-10-CM | POA: Diagnosis not present

## 2023-10-10 DIAGNOSIS — X32XXXD Exposure to sunlight, subsequent encounter: Secondary | ICD-10-CM | POA: Diagnosis not present

## 2023-10-10 DIAGNOSIS — D225 Melanocytic nevi of trunk: Secondary | ICD-10-CM | POA: Diagnosis not present

## 2023-10-10 LAB — URINE CULTURE

## 2023-10-20 ENCOUNTER — Other Ambulatory Visit: Payer: Self-pay | Admitting: Family Medicine

## 2023-10-20 DIAGNOSIS — I10 Essential (primary) hypertension: Secondary | ICD-10-CM

## 2023-10-21 ENCOUNTER — Encounter: Payer: Self-pay | Admitting: Family Medicine

## 2023-10-25 ENCOUNTER — Ambulatory Visit: Admitting: Family Medicine

## 2023-10-25 ENCOUNTER — Encounter: Payer: Self-pay | Admitting: Family Medicine

## 2023-10-25 VITALS — BP 141/71 | HR 69 | Temp 97.8°F | Ht 61.0 in | Wt 128.0 lb

## 2023-10-25 DIAGNOSIS — D6851 Activated protein C resistance: Secondary | ICD-10-CM | POA: Diagnosis not present

## 2023-10-25 DIAGNOSIS — I10 Essential (primary) hypertension: Secondary | ICD-10-CM | POA: Diagnosis not present

## 2023-10-25 DIAGNOSIS — R0981 Nasal congestion: Secondary | ICD-10-CM

## 2023-10-25 DIAGNOSIS — Z5181 Encounter for therapeutic drug level monitoring: Secondary | ICD-10-CM | POA: Diagnosis not present

## 2023-10-25 DIAGNOSIS — Z7901 Long term (current) use of anticoagulants: Secondary | ICD-10-CM | POA: Diagnosis not present

## 2023-10-25 LAB — POCT INR: INR: 2.7 (ref 2.0–3.0)

## 2023-10-25 LAB — COAGUCHEK XS/INR WAIVED
INR: 2.7 — ABNORMAL HIGH (ref 0.9–1.1)
Prothrombin Time: 31.9 s

## 2023-10-25 MED ORDER — AZELASTINE HCL 0.1 % NA SOLN: 1.0000 | Freq: Two times a day (BID) | NASAL | 2 refills | Status: AC

## 2023-10-25 NOTE — Progress Notes (Signed)
   Acute Office Visit  Subjective:     Patient ID: [name], female    DOB: 1956/01/07, 68 y.o.   MRN: [id number]  Chief Complaint  Patient presents with   Anticoagulation   Nasal Congestion    HPI Patient is in today for INR check.   Patient reports compliance with Coumadin . No missed doses. No changes in diet. Reports no bleeding.   Reports nasal congestion for 1 weeks. No other symptoms. She has been using flonase , nasal saline, and afrin. She has used afrin nightly for the last week. She reports that nasal congestion had been improved until yesterday. No other symptoms.   Bp is mildly elevated today. Denies chest pain, shortness of breath, edema.   ROS As per HPI.      Objective:    BP (!) 141/71   Pulse 69   Temp 97.8 F (36.6 C) (Temporal)   Ht 5\' 1"  (1.549 m)   Wt 128 lb (58.1 kg)   SpO2 96%   BMI 24.19 kg/m  BP Readings from Last 3 Encounters:  10/25/23 (!) 141/71  10/07/23 (!) 179/82  08/27/23 (!) 147/80      Physical Exam Vitals and nursing note reviewed.  Constitutional:      General: She is not in acute distress.    Appearance: Normal appearance. She is not ill-appearing, toxic-appearing or diaphoretic.  HENT:     Nose: Congestion present.  Cardiovascular:     Rate and Rhythm: Normal rate and regular rhythm.     Heart sounds: Normal heart sounds. No murmur heard. Pulmonary:     Effort: Pulmonary effort is normal. No respiratory distress.     Breath sounds: Normal breath sounds. No stridor. No wheezing, rhonchi or rales.  Musculoskeletal:     Right lower leg: No edema.     Left lower leg: No edema.  Skin:    General: Skin is warm and dry.  Neurological:     General: No focal deficit present.     Mental Status: She is alert and oriented to person, place, and time.  Psychiatric:        Mood and Affect: Mood normal.        Behavior: Behavior normal.     No results found for any visits on 10/25/23.      Assessment & Plan:   Nachelle  was seen today for anticoagulation and nasal congestion.  Diagnoses and all orders for this visit:  Factor V Leiden mutation (HCC) Warfarin anticoagulation Anticoagulation goal of INR 2 to 3 Description   INR at goal. Continue current regimen. Return in 8 weeks for recheck.     -     CoaguChek XS/INR Waived -     POCT INR  Hypertension, essential BP mildly elevated today. Asymptomatic. Monitor at home and notify for elevated readings.   Nasal congestion Can add astelin. Discontinue afrin. Discussed rebound nasal congestion with Afrin use.  -     azelastine (ASTELIN) 0.1 % nasal spray; Place 1 spray into both nostrils 2 (two) times daily. Use in each nostril as directed  Return in about 8 weeks (around 12/20/2023) for with PCP for INR.  The patient indicates understanding of these issues and agrees with the plan.  Albertha Huger, FNP

## 2023-12-04 ENCOUNTER — Ambulatory Visit: Admitting: Family Medicine

## 2023-12-20 ENCOUNTER — Ambulatory Visit: Admitting: Family Medicine

## 2023-12-29 ENCOUNTER — Other Ambulatory Visit: Payer: Self-pay | Admitting: Family Medicine

## 2023-12-29 DIAGNOSIS — D6851 Activated protein C resistance: Secondary | ICD-10-CM

## 2024-01-01 ENCOUNTER — Encounter: Payer: Self-pay | Admitting: Family Medicine

## 2024-01-01 ENCOUNTER — Ambulatory Visit: Admitting: Family Medicine

## 2024-01-01 VITALS — BP 132/77 | HR 72 | Temp 97.5°F | Ht 63.0 in | Wt 129.2 lb

## 2024-01-01 DIAGNOSIS — Z7901 Long term (current) use of anticoagulants: Secondary | ICD-10-CM | POA: Diagnosis not present

## 2024-01-01 DIAGNOSIS — D6851 Activated protein C resistance: Secondary | ICD-10-CM | POA: Diagnosis not present

## 2024-01-01 DIAGNOSIS — Z5181 Encounter for therapeutic drug level monitoring: Secondary | ICD-10-CM | POA: Diagnosis not present

## 2024-01-01 LAB — COAGUCHEK XS/INR WAIVED
INR: 2.9 — ABNORMAL HIGH (ref 0.9–1.1)
Prothrombin Time: 35 s

## 2024-01-01 NOTE — Progress Notes (Signed)
 Subjective: CC: INR check up PCP: Jolinda Emily HERO, DO HPI:[name] is a 68 y.o. female presenting to clinic today for:  1.  Factor V Leyden mutation Goal INR 2-3 Patient is compliant with Coumadin .  She denies any bleeding, limb swelling or pain.  She remains physically active and has maintained a balanced diet that still incorporate some spinach etc.   ROS: Per HPI  Allergies  Allergen Reactions   Codeine    Donnatal [Belladonna Alk-Phenobarb Er]    Librium    Past Medical History:  Diagnosis Date   Anemia    Factor V Leiden (HCC)    Hypertension    Stroke (HCC)     Current Outpatient Medications:    azelastine  (ASTELIN ) 0.1 % nasal spray, Place 1 spray into both nostrils 2 (two) times daily. Use in each nostril as directed, Disp: 30 mL, Rfl: 2   fluticasone  (FLONASE ) 50 MCG/ACT nasal spray, USE 2 SPRAYS IN EACH NOSTRIL DAILY, Disp: 16 g, Rfl: 6   hydrochlorothiazide  (MICROZIDE ) 12.5 MG capsule, TAKE ONE (1) CAPSULE EACH DAY, Disp: 90 capsule, Rfl: 0   triamcinolone  cream (KENALOG ) 0.1 %, Apply 1 Application topically 2 (two) times daily as needed., Disp: 30 g, Rfl: 1   warfarin (COUMADIN ) 5 MG tablet, TAKE TWO (2) TABLETS BY MOUTH DAILY, Disp: 180 tablet, Rfl: 0   acetic acid  2 % otic solution, Place 4 drops into both ears in the morning and at bedtime. (Patient not taking: Reported on 01/01/2024), Disp: 15 mL, Rfl: 0 Social History   Socioeconomic History   Marital status: Married    Spouse name: Not on file   Number of children: 2   Years of education: Not on file   Highest education level: Not on file  Occupational History   Occupation: Retired [employer] operational officer  Tobacco Use   Smoking status: Never    Passive exposure: Never   Smokeless tobacco: Never  Vaping Use   Vaping status: Never Used  Substance and Sexual Activity   Alcohol use: Not Currently    Alcohol/week: 0.0 standard drinks of alcohol   Drug use: Not Currently   Sexual activity: Not on file   Other Topics Concern   Not on file  Social History Narrative   Not on file   Social Drivers of Health   Financial Resource Strain: Not on file  Food Insecurity: Not on file  Transportation Needs: Not on file  Physical Activity: Not on file  Stress: Not on file  Social Connections: Not on file  Intimate Partner Violence: Not on file   Family History  Problem Relation Age of Onset   Stroke Mother    Cancer Father    Breast cancer Sister 58   Diabetes Brother     Objective: Office vital signs reviewed. BP 132/77   Pulse 72   Temp (!) 97.5 F (36.4 C)   Ht 5' 3 (1.6 m)   Wt 129 lb 4 oz (58.6 kg)   SpO2 98%   BMI 22.90 kg/m   Physical Examination:  General: Awake, alert, well nourished, No acute distress  Assessment/ Plan: 68 y.o. female   Factor V Leiden mutation (HCC) - Plan: CoaguChek XS/INR Waived  Anticoagulation goal of INR 2 to 3 - Plan: CoaguChek XS/INR Waived  Warfarin anticoagulation - Plan: CoaguChek XS/INR Waived  INR therapeutic at 2.9.  No changes needed.  Will plan to see each other back in 8 to 12 weeks for physical with fasting labs  and repeat INR testing etc.   Emily CHRISTELLA Fielding, DO [facility] [phone]

## 2024-01-16 ENCOUNTER — Encounter: Payer: Self-pay | Admitting: Family Medicine

## 2024-01-16 ENCOUNTER — Ambulatory Visit (INDEPENDENT_AMBULATORY_CARE_PROVIDER_SITE_OTHER): Admitting: Family Medicine

## 2024-01-16 VITALS — BP 141/75 | HR 67 | Temp 97.6°F | Ht 63.0 in | Wt 131.2 lb

## 2024-01-16 DIAGNOSIS — J0100 Acute maxillary sinusitis, unspecified: Secondary | ICD-10-CM | POA: Diagnosis not present

## 2024-01-16 MED ORDER — CHLORPHEN-PE-ACETAMINOPHEN 4-10-325 MG PO TABS: 1.0000 | ORAL_TABLET | Freq: Four times a day (QID) | ORAL | 0 refills | Status: DC | PRN

## 2024-01-16 MED ORDER — AMOXICILLIN-POT CLAVULANATE 875-125 MG PO TABS: 1.0000 | ORAL_TABLET | Freq: Two times a day (BID) | ORAL | 0 refills | Status: DC

## 2024-01-16 NOTE — Progress Notes (Signed)
 Subjective:  Patient ID: [name], female    DOB: January 26, 1956, 68 y.o.   MRN: [id number]  Patient Care Team: [name], DO as PCP - General (Family Medicine) Lennard Lesta FALCON, MD as Consulting Physician (Gastroenterology) [place] (Gynecology) Leva Rush, MD as Consulting Physician (Obstetrics and Gynecology)   Chief Complaint:  Nasal Congestion, Cough, and Headache (X 3 days- otc allergy medication )   HPI: [name] is a 68 y.o. female presenting on 01/16/2024 for Nasal Congestion, Cough, and Headache (X 3 days- otc allergy medication )   [name] is a 68 year old female who presents with symptoms of a sinus infection.  She has been experiencing symptoms since Monday, including nasal congestion, rhinorrhea, cough, and a sensation of ear fullness. No fever or chills are present. She has a productive cough but does not specify the characteristics of the sputum.  She manages her symptoms with over-the-counter medications, including a generic brand of Zyrtec  every morning, which she feels is ineffective. She also uses Flonase  nasal spray at night and in the morning, although she skipped it this morning. Additionally, she has two other nasal sprays and a steroid ear drop prescribed by her ear doctor, which she used recently.  No shortness of breath or wheezing is present. She experiences facial pressure but denies any headache.  Socially, she usually works out at [employer] and has started walking with a friend this week, which she thinks might have contributed to her symptoms.          Relevant past medical, surgical, family, and social history reviewed and updated as indicated.  Allergies and medications reviewed and updated. Data reviewed: Chart in Epic.   Past Medical History:  Diagnosis Date   Anemia    Factor V Leiden (HCC)    Hypertension    Stroke [facility])     History reviewed. No pertinent surgical history.  Social  History   Socioeconomic History   Marital status: Married    Spouse name: Not on file   Number of children: 2   Years of education: Not on file   Highest education level: Not on file  Occupational History   Occupation: Retired [employer] operational officer  Tobacco Use   Smoking status: Never    Passive exposure: Never   Smokeless tobacco: Never  Vaping Use   Vaping status: Never Used  Substance and Sexual Activity   Alcohol use: Not Currently    Alcohol/week: 0.0 standard drinks of alcohol   Drug use: Not Currently   Sexual activity: Not on file  Other Topics Concern   Not on file  Social History Narrative   Not on file   Social Drivers of Health   Financial Resource Strain: Not on file  Food Insecurity: Not on file  Transportation Needs: Not on file  Physical Activity: Not on file  Stress: Not on file  Social Connections: Not on file  Intimate Partner Violence: Not on file    Outpatient Encounter Medications as of 01/16/2024  Medication Sig   acetic acid  2 % otic solution Place 4 drops into both ears in the morning and at bedtime.   amoxicillin -clavulanate (AUGMENTIN ) 875-125 MG tablet Take 1 tablet by mouth 2 (two) times daily.   azelastine  (ASTELIN ) 0.1 % nasal spray Place 1 spray into both nostrils 2 (two) times daily. Use in each nostril as directed   Chlorphen-PE-Acetaminophen  4-10-325 MG TABS Take 1 tablet by mouth every  6 (six) hours as needed.   fluticasone  (FLONASE ) 50 MCG/ACT nasal spray USE 2 SPRAYS IN EACH NOSTRIL DAILY   hydrochlorothiazide  (MICROZIDE ) 12.5 MG capsule TAKE ONE (1) CAPSULE EACH DAY   triamcinolone  cream (KENALOG ) 0.1 % Apply 1 Application topically 2 (two) times daily as needed.   warfarin (COUMADIN ) 5 MG tablet TAKE TWO (2) TABLETS BY MOUTH DAILY   No facility-administered encounter medications on file as of 01/16/2024.    Allergies  Allergen Reactions   Codeine    Donnatal [Belladonna Alk-Phenobarb Er]    Librium     Pertinent ROS per HPI,  otherwise unremarkable      Objective:  BP (!) 141/75   Pulse 67   Temp 97.6 F (36.4 C)   Ht 5' 3 (1.6 m)   Wt 131 lb 3.2 oz (59.5 kg)   SpO2 97%   BMI 23.24 kg/m    Wt Readings from Last 3 Encounters:  01/16/24 131 lb 3.2 oz (59.5 kg)  01/01/24 129 lb 4 oz (58.6 kg)  10/25/23 128 lb (58.1 kg)    Physical Exam Vitals and nursing note reviewed.  Constitutional:      Appearance: She is well-developed and normal weight.  HENT:     Head: Normocephalic and atraumatic.     Right Ear: A middle ear effusion is present. Tympanic membrane is not erythematous.     Left Ear: A middle ear effusion is present. Tympanic membrane is not erythematous.     Nose: Congestion present.     Right Turbinates: Enlarged.     Left Turbinates: Enlarged.     Right Sinus: Maxillary sinus tenderness present. No frontal sinus tenderness.     Left Sinus: Maxillary sinus tenderness present. No frontal sinus tenderness.     Mouth/Throat:     Mouth: Mucous membranes are moist.     Pharynx: Postnasal drip present. No pharyngeal swelling, oropharyngeal exudate, posterior oropharyngeal erythema or uvula swelling.  Eyes:     Conjunctiva/sclera: Conjunctivae normal.     Pupils: Pupils are equal, round, and reactive to light.  Cardiovascular:     Rate and Rhythm: Normal rate and regular rhythm.     Heart sounds: Normal heart sounds.  Pulmonary:     Effort: Pulmonary effort is normal.     Breath sounds: Normal breath sounds.  Musculoskeletal:     Cervical back: Neck supple.     Right lower leg: No edema.     Left lower leg: No edema.  Lymphadenopathy:     Cervical: No cervical adenopathy.  Skin:    General: Skin is warm and dry.     Capillary Refill: Capillary refill takes less than 2 seconds.  Neurological:     General: No focal deficit present.     Mental Status: She is alert and oriented to person, place, and time.  Psychiatric:        Mood and Affect: Mood normal.        Behavior: Behavior  normal. Behavior is cooperative.        Thought Content: Thought content normal.        Judgment: Judgment normal.    Results for orders placed or performed in visit on 01/01/24  CoaguChek XS/INR Waived   Collection Time: 01/01/24  2:29 PM  Result Value Ref Range   INR 2.9 (H) 0.9 - 1.1   Prothrombin Time 35.0 sec       Pertinent labs & imaging results that were available during my care of  the patient were reviewed by me and considered in my medical decision making.  Assessment & Plan:  Mea was seen today for nasal congestion, cough and headache.  Diagnoses and all orders for this visit:  Acute non-recurrent maxillary sinusitis -     Chlorphen-PE-Acetaminophen  4-10-325 MG TABS; Take 1 tablet by mouth every 6 (six) hours as needed. -     amoxicillin -clavulanate (AUGMENTIN ) 875-125 MG tablet; Take 1 tablet by mouth 2 (two) times daily.     Acute sinusitis with upper respiratory symptoms Symptoms consistent with acute sinusitis, including nasal congestion, rhinorrhea, cough, and aural fullness, starting Monday. No fever, chills, or significant pain reported. Symptoms suggestive of maxillary sinus involvement. Likely viral in nature, as sinus infections are typically self-limiting and resolve within 7-14 days. - Prescribe Norel for congestion, antihistamine, and cough relief. - Advise continuation of Zyrtec  and Flonase  as previously used. - Instruct to increase water intake. - Provide a prescription for antibiotics to be used if symptoms do not improve after a few days of Norel or if symptoms worsen or fever develops. - Send prescriptions to her preferred drugstore.        Continue all other maintenance medications.  Follow up plan: Return if symptoms worsen or fail to improve.   Continue healthy lifestyle choices, including diet (rich in fruits, vegetables, and lean proteins, and low in salt and simple carbohydrates) and exercise (at least 30 minutes of moderate physical  activity daily).  Educational handout given for sinusitis   The above assessment and management plan was discussed with the patient. The patient verbalized understanding of and has agreed to the management plan. Patient is aware to call the clinic if they develop any new symptoms or if symptoms persist or worsen. Patient is aware when to return to the clinic for a follow-up visit. Patient educated on when it is appropriate to go to the emergency department.   Rosaline Bruns, FNP-C [facility] [phone]

## 2024-01-19 ENCOUNTER — Other Ambulatory Visit: Payer: Self-pay | Admitting: Family Medicine

## 2024-01-19 DIAGNOSIS — I10 Essential (primary) hypertension: Secondary | ICD-10-CM

## 2024-02-14 ENCOUNTER — Ambulatory Visit: Admitting: Family Medicine

## 2024-02-14 ENCOUNTER — Encounter: Payer: Self-pay | Admitting: Family Medicine

## 2024-02-14 VITALS — BP 135/76 | HR 67 | Temp 97.7°F | Ht 63.0 in | Wt 129.1 lb

## 2024-02-14 DIAGNOSIS — Z23 Encounter for immunization: Secondary | ICD-10-CM | POA: Diagnosis not present

## 2024-02-14 DIAGNOSIS — Z5181 Encounter for therapeutic drug level monitoring: Secondary | ICD-10-CM | POA: Diagnosis not present

## 2024-02-14 DIAGNOSIS — D6851 Activated protein C resistance: Secondary | ICD-10-CM | POA: Diagnosis not present

## 2024-02-14 DIAGNOSIS — Z7901 Long term (current) use of anticoagulants: Secondary | ICD-10-CM

## 2024-02-14 LAB — COAGUCHEK XS/INR WAIVED
INR: 3 — ABNORMAL HIGH (ref 0.9–1.1)
Prothrombin Time: 35.9 s

## 2024-02-14 MED ORDER — WARFARIN SODIUM 5 MG PO TABS: 10.0000 mg | ORAL_TABLET | Freq: Every day | ORAL | 4 refills | Status: AC

## 2024-02-14 NOTE — Progress Notes (Signed)
 Subjective: CC: Chronic anticoagulation PCP: [name], DO HPI:[name] is a 68 y.o. female presenting to clinic today for:  Factor V Leyden mutation Goal INR 2-3 Patient reports that she continues to stay physically active and consume green leafy vegetables.  She is compliant with Coumadin  10 mg daily.  Reports no bleeding, chest pain or change in exercise tolerance.   ROS: Per HPI  Allergies  Allergen Reactions   Codeine    Donnatal [Belladonna Alk-Phenobarb Er]    Librium    Past Medical History:  Diagnosis Date   Anemia    Factor V Leiden    Hypertension    Stroke [facility])     Current Outpatient Medications:    acetic acid  2 % otic solution, Place 4 drops into both ears in the morning and at bedtime., Disp: 15 mL, Rfl: 0   azelastine  (ASTELIN ) 0.1 % nasal spray, Place 1 spray into both nostrils 2 (two) times daily. Use in each nostril as directed, Disp: 30 mL, Rfl: 2   fluticasone  (FLONASE ) 50 MCG/ACT nasal spray, USE 2 SPRAYS IN EACH NOSTRIL DAILY, Disp: 16 g, Rfl: 6   hydrochlorothiazide  (MICROZIDE ) 12.5 MG capsule, TAKE ONE (1) CAPSULE EACH DAY, Disp: 90 capsule, Rfl: 0   triamcinolone  cream (KENALOG ) 0.1 %, Apply 1 Application topically 2 (two) times daily as needed., Disp: 30 g, Rfl: 1   warfarin (COUMADIN ) 5 MG tablet, TAKE TWO (2) TABLETS BY MOUTH DAILY, Disp: 180 tablet, Rfl: 0 Social History   Socioeconomic History   Marital status: Married    Spouse name: Not on file   Number of children: 2   Years of education: Not on file   Highest education level: Not on file  Occupational History   Occupation: Retired [employer] operational officer  Tobacco Use   Smoking status: Never    Passive exposure: Never   Smokeless tobacco: Never  Vaping Use   Vaping status: Never Used  Substance and Sexual Activity   Alcohol use: Not Currently    Alcohol/week: 0.0 standard drinks of alcohol   Drug use: Not Currently   Sexual activity: Not on file  Other Topics Concern   Not  on file  Social History Narrative   Not on file   Social Drivers of Health   Financial Resource Strain: Not on file  Food Insecurity: Not on file  Transportation Needs: Not on file  Physical Activity: Not on file  Stress: Not on file  Social Connections: Not on file  Intimate Partner Violence: Not on file   Family History  Problem Relation Age of Onset   Stroke Mother    Cancer Father    Breast cancer Sister 27   Diabetes Brother     Objective: Office vital signs reviewed. BP 135/76   Pulse 67   Temp 97.7 F (36.5 C)   Ht 5' 3 (1.6 m)   Wt 129 lb 2 oz (58.6 kg)   SpO2 97%   BMI 22.87 kg/m   Physical Examination:  General: Awake, alert, well nourished, No acute distress HEENT: sclera white MMM Cardio: regular rate and rhythm, S1S2 heard, no murmurs appreciated Pulm: clear to auscultation bilaterally, no wheezes, rhonchi or rales; normal work of breathing on room air    Assessment/ Plan: 68 y.o. female   Factor V Leiden mutation - Plan: CoaguChek XS/INR Waived, warfarin (COUMADIN ) 5 MG tablet  Anticoagulation goal of INR 2 to 3 - Plan: CoaguChek XS/INR Waived  Encounter for immunization - Plan:  Flu vaccine HIGH DOSE PF(Fluzone Trivalent)   Coumadin  renewed.  INR therapeutic at 3.0.  Flu vaccine administered.  Will see her back in 2 months unless she needs me sooner   [name], DO [facility] [phone]

## 2024-02-20 ENCOUNTER — Ambulatory Visit

## 2024-02-20 VITALS — BP 135/76 | HR 67 | Ht 63.0 in | Wt 129.0 lb

## 2024-02-20 DIAGNOSIS — Z0000 Encounter for general adult medical examination without abnormal findings: Secondary | ICD-10-CM

## 2024-02-20 NOTE — Patient Instructions (Signed)
 [name],  Thank you for taking the time for your Medicare Wellness Visit. I appreciate your continued commitment to your health goals. Please review the care plan we discussed, and feel free to reach out if I can assist you further.  Medicare recommends these wellness visits once per year to help you and your care team stay ahead of potential health issues. These visits are designed to focus on prevention, allowing your provider to concentrate on managing your acute and chronic conditions during your regular appointments.  Please note that Annual Wellness Visits do not include a physical exam. Some assessments may be limited, especially if the visit was conducted virtually. If needed, we may recommend a separate in-person follow-up with your provider.  Ongoing Care Seeing your primary care provider every 3 to 6 months helps us  monitor your health and provide consistent, personalized care.   Referrals If a referral was made during today's visit and you haven't received any updates within two weeks, please contact the referred provider directly to check on the status.  Recommended Screenings:  Health Maintenance  Topic Date Due   Hepatitis C Screening  Never done   Medicare Annual Wellness Visit  02/15/2024   COVID-19 Vaccine (3 - 2025-26 season) 03/01/2024*   DEXA scan (bone density measurement)  04/18/2024   DTaP/Tdap/Td vaccine (2 - Td or Tdap) 06/15/2025   Breast Cancer Screening  08/19/2025   Colon Cancer Screening  03/17/2029   Pneumococcal Vaccine for age over 50  Completed   Flu Shot  Completed   Zoster (Shingles) Vaccine  Completed   HPV Vaccine  Aged Out   Meningitis B Vaccine  Aged Out  *Topic was postponed. The date shown is not the original due date.       02/20/2024   12:16 PM  Advanced Directives  Does Patient Have a Medical Advance Directive? No   Advance Care Planning is important because it: Ensures you receive medical care that aligns with your values,  goals, and preferences. Provides guidance to your family and loved ones, reducing the emotional burden of decision-making during critical moments.  Vision: Annual vision screenings are recommended for early detection of glaucoma, cataracts, and diabetic retinopathy. These exams can also reveal signs of chronic conditions such as diabetes and high blood pressure.  Dental: Annual dental screenings help detect early signs of oral cancer, gum disease, and other conditions linked to overall health, including heart disease and diabetes.  Please see the attached documents for additional preventive care recommendations.

## 2024-02-20 NOTE — Progress Notes (Signed)
 Subjective:   [name] is a 68 y.o. who presents for a Medicare Wellness preventive visit.  As a reminder, Annual Wellness Visits don't include a physical exam, and some assessments may be limited, especially if this visit is performed virtually. We may recommend an in-person follow-up visit with your provider if needed.  Visit Complete: Virtual I connected with  [name] on 02/20/24 by a audio enabled telemedicine application and verified that I am speaking with the correct person using two identifiers.  Patient Location: Home  Provider Location: Home Office  I discussed the limitations of evaluation and management by telemedicine. The patient expressed understanding and agreed to proceed.  Vital Signs: Because this visit was a virtual/telehealth visit, some criteria may be missing or patient reported. Any vitals not documented were not able to be obtained and vitals that have been documented are patient reported.  VideoDeclined- This patient declined Librarian, academic. Therefore the visit was completed with audio only.  Persons Participating in Visit: Patient.  AWV Questionnaire: No: Patient Medicare AWV questionnaire was not completed prior to this visit.  Cardiac Risk Factors include: advanced age (>36men, >76 women);hypertension     Objective:    Today's Vitals   02/20/24 1417  BP: 135/76  Pulse: 67  Weight: 129 lb (58.5 kg)   Body mass index is 22.85 kg/m.     02/20/2024   12:16 PM 02/15/2023   12:44 PM 02/15/2023   11:14 AM 02/15/2023   11:10 AM 01/03/2022    9:56 PM 03/18/2019   12:31 AM  Advanced Directives  Does Patient Have a Medical Advance Directive? No Yes  Yes No No  Type of Advance Directive  Living will;Healthcare Power of Attorney  Living will    Does patient want to make changes to medical advance directive?   No - Patient declined     Copy of Healthcare Power of Attorney in Chart?  Yes - validated most recent  copy scanned in chart (See row information)      Would patient like information on creating a medical advance directive?     No - Patient declined No - Patient declined    Current Medications (verified) Outpatient Encounter Medications as of 02/20/2024  Medication Sig   azelastine  (ASTELIN ) 0.1 % nasal spray Place 1 spray into both nostrils 2 (two) times daily. Use in each nostril as directed   fluticasone  (FLONASE ) 50 MCG/ACT nasal spray USE 2 SPRAYS IN EACH NOSTRIL DAILY   hydrochlorothiazide  (MICROZIDE ) 12.5 MG capsule TAKE ONE (1) CAPSULE EACH DAY   triamcinolone  cream (KENALOG ) 0.1 % Apply 1 Application topically 2 (two) times daily as needed.   warfarin (COUMADIN ) 5 MG tablet Take 2 tablets (10 mg total) by mouth daily.   acetic acid  2 % otic solution Place 4 drops into both ears in the morning and at bedtime. (Patient not taking: Reported on 02/20/2024)   No facility-administered encounter medications on file as of 02/20/2024.    Allergies (verified) Codeine, Donnatal [belladonna alk-phenobarb er], and Librium   History: Past Medical History:  Diagnosis Date   Anemia    Factor V Leiden    Hypertension    Stroke [facility])    History reviewed. No pertinent surgical history. Family History  Problem Relation Age of Onset   Stroke Mother    Cancer Father    Breast cancer Sister 38   Diabetes Brother    Social History   Socioeconomic History   Marital status:  Married    Spouse name: Not on file   Number of children: 2   Years of education: Not on file   Highest education level: Not on file  Occupational History   Occupation: Retired Diplomatic Services operational officer  Tobacco Use   Smoking status: Never    Passive exposure: Never   Smokeless tobacco: Never  Vaping Use   Vaping status: Never Used  Substance and Sexual Activity   Alcohol use: Not Currently    Alcohol/week: 0.0 standard drinks of alcohol   Drug use: Not Currently   Sexual activity: Not on file  Other Topics Concern   Not on  file  Social History Narrative   Not on file   Social Drivers of Health   Financial Resource Strain: Low Risk  (02/20/2024)   Overall Financial Resource Strain (CARDIA)    Difficulty of Paying Living Expenses: Not hard at all  Food Insecurity: No Food Insecurity (02/20/2024)   Hunger Vital Sign    Worried About Running Out of Food in the Last Year: Never true    Ran Out of Food in the Last Year: Never true  Transportation Needs: No Transportation Needs (02/20/2024)   PRAPARE - Administrator, Civil Service (Medical): No    Lack of Transportation (Non-Medical): No  Physical Activity: Sufficiently Active (02/20/2024)   Exercise Vital Sign    Days of Exercise per Week: 5 days    Minutes of Exercise per Session: 70 min  Stress: No Stress Concern Present (02/20/2024)   [employer] of Occupational Health - Occupational Stress Questionnaire    Feeling of Stress: Not at all  Social Connections: Moderately Integrated (02/20/2024)   Social Connection and Isolation Panel    Frequency of Communication with Friends and Family: More than three times a week    Frequency of Social Gatherings with Friends and Family: More than three times a week    Attends Religious Services: More than 4 times per year    Active Member of [employer] or Organizations: No    Attends Engineer, structural: Never    Marital Status: Married    Tobacco Counseling Counseling given: No    Clinical Intake:  Pre-visit preparation completed: Yes  Pain : No/denies pain     Nutritional Risks: None Diabetes: No  Lab Results  Component Value Date   HGBA1C 5.8 (H) 03/07/2023     How often do you need to have someone help you when you read instructions, pamphlets, or other written materials from your doctor or pharmacy?: 1 - Never  Interpreter Needed?: No  Information entered by :: alia t/cma   Activities of Daily Living     02/20/2024   12:14 PM  In your present state of health, do you  have any difficulty performing the following activities:  Hearing? 0  Vision? 0  Difficulty concentrating or making decisions? 0  Walking or climbing stairs? 0  Dressing or bathing? 0  Doing errands, shopping? 0  Preparing Food and eating ? N  Using the Toilet? N  In the past six months, have you accidently leaked urine? N  Do you have problems with loss of bowel control? N  Managing your Medications? N  Managing your Finances? N  Housekeeping or managing your Housekeeping? N    Patient Care Team: [name], DO as PCP - General (Family Medicine) Lennard Lesta FALCON, MD as Consulting Physician (Gastroenterology) [facility] (Gynecology) Leva Rush, MD as Consulting Physician (Obstetrics and  Gynecology)  I have updated your Care Teams any recent Medical Services you may have received from other providers in the past year.     Assessment:   This is a routine wellness examination for [name].  Hearing/Vision screen Hearing Screening - Comments:: Pt denies hearing dif Vision Screening - Comments:: Pt wear just reading glasses/last ov 51yrs ago   Goals Addressed             This Visit's Progress    Patient Stated       Loose some wt/travel more while she can w/husband       Depression Screen     02/20/2024   12:16 PM 02/14/2024    3:40 PM 01/01/2024    2:24 PM 10/25/2023    3:02 PM 07/09/2023   10:52 AM 05/10/2023    1:51 PM 05/03/2023    3:04 PM  PHQ 2/9 Scores  PHQ - 2 Score 0  0 0 0 0 0  PHQ- 9 Score 0  0  0 0 0  Exception Documentation  Patient refusal         Fall Risk     02/20/2024   12:13 PM 02/14/2024    3:40 PM 01/01/2024    2:24 PM 10/25/2023    3:02 PM 07/09/2023   10:52 AM  Fall Risk   Falls in the past year? 0 0 0 0 0  Number falls in past yr: 0  0  0  Injury with Fall? 0  0  0  Risk for fall due to : No Fall Risks No Fall Risks No Fall Risks  No Fall Risks  Follow up Falls evaluation completed Falls evaluation completed  Falls evaluation completed  Education provided    MEDICARE RISK AT HOME:  Medicare Risk at Home Any stairs in or around the home?: Yes If so, are there any without handrails?: Yes Home free of loose throw rugs in walkways, pet beds, electrical cords, etc?: Yes Adequate lighting in your home to reduce risk of falls?: Yes Life alert?: No Use of a cane, walker or w/c?: No Grab bars in the bathroom?: No Shower chair or bench in shower?: Yes Elevated toilet seat or a handicapped toilet?: Yes  TIMED UP AND GO:  Was the test performed?  no  Cognitive Function: 6CIT completed    02/15/2023   11:11 AM  MMSE - Mini Mental State Exam  Orientation to time 5  Orientation to Place 5  Registration 3  Attention/ Calculation 5  Recall 3  Language- name 2 objects 2  Language- repeat 1  Language- follow 3 step command 3  Language- read & follow direction 1  Write a sentence 1  Copy design 1  Total score 30        02/20/2024   12:18 PM  6CIT Screen  What Year? 0 points  What month? 0 points  What time? 0 points  Count back from 20 0 points  Months in reverse 0 points  Repeat phrase 0 points  Total Score 0 points    Immunizations Immunization History  Administered Date(s) Administered   Fluad Trivalent(High Dose 65+) 03/19/2023   INFLUENZA, HIGH DOSE SEASONAL PF 02/14/2024   Influenza,inj,Quad PF,6+ Mos 02/19/2013, 04/01/2014, 03/17/2015, 03/15/2016, 03/05/2017, 03/06/2018, 02/09/2019, 02/02/2020, 04/06/2021, 02/14/2022   Influenza-Unspecified 06/02/2009, 03/13/2012   PFIZER(Purple Top)SARS-COV-2 Vaccination 07/31/2019, 08/24/2019   PNEUMOCOCCAL CONJUGATE-20 05/08/2022   Tdap 06/16/2015   Zoster Recombinant(Shingrix ) 11/05/2018, 06/23/2019   Zoster, Live 06/18/2013   Zoster, Unspecified  06/18/2013, 06/18/2013    Screening Tests Health Maintenance  Topic Date Due   Hepatitis C Screening  Never done   COVID-19 Vaccine (3 - 2025-26 season) 03/01/2024 (Originally 01/20/2024)    DEXA SCAN  04/18/2024   Medicare Annual Wellness (AWV)  02/19/2025   DTaP/Tdap/Td (2 - Td or Tdap) 06/15/2025   Mammogram  08/19/2025   Colonoscopy  03/17/2029   Pneumococcal Vaccine: 50+ Years  Completed   Influenza Vaccine  Completed   Zoster Vaccines- Shingrix   Completed   HPV VACCINES  Aged Out   Meningococcal B Vaccine  Aged Out    Health Maintenance Items Addressed: See Nurse Notes at the end of this note  Additional Screening:  Vision Screening: Recommended annual ophthalmology exams for early detection of glaucoma and other disorders of the eye. Is the patient up to date with their annual eye exam?  No  Who is the provider or what is the name of the office in which the patient attends annual eye exams? N/a  Dental Screening: Recommended annual dental exams for proper oral hygiene  Community Resource Referral / Chronic Care Management: CRR required this visit?  No   CCM required this visit?  No   Plan:    I have personally reviewed and noted the following in the patient's chart:   Medical and social history Use of alcohol, tobacco or illicit drugs  Current medications and supplements including opioid prescriptions. Patient is not currently taking opioid prescriptions. Functional ability and status Nutritional status Physical activity Advanced directives List of other physicians Hospitalizations, surgeries, and ER visits in previous 12 months Vitals Screenings to include cognitive, depression, and falls Referrals and appointments  In addition, I have reviewed and discussed with patient certain preventive protocols, quality metrics, and best practice recommendations. A written personalized care plan for preventive services as well as general preventive health recommendations were provided to patient.   Ozie Ned, CMA   02/20/2024   After Visit Summary: (MyChart) Due to this being a telephonic visit, the after visit summary with patients personalized plan  was offered to patient via MyChart   Notes: Nothing significant to report at this time.

## 2024-03-27 ENCOUNTER — Encounter: Payer: Self-pay | Admitting: Family Medicine

## 2024-03-27 ENCOUNTER — Ambulatory Visit (INDEPENDENT_AMBULATORY_CARE_PROVIDER_SITE_OTHER): Admitting: Family Medicine

## 2024-03-27 ENCOUNTER — Ambulatory Visit: Payer: Self-pay | Admitting: *Deleted

## 2024-03-27 VITALS — BP 144/72 | HR 70 | Temp 97.8°F | Ht 63.0 in | Wt 127.0 lb

## 2024-03-27 DIAGNOSIS — N3000 Acute cystitis without hematuria: Secondary | ICD-10-CM | POA: Diagnosis not present

## 2024-03-27 LAB — URINALYSIS, COMPLETE
Bilirubin, UA: NEGATIVE
Glucose, UA: NEGATIVE
Ketones, UA: NEGATIVE
Nitrite, UA: POSITIVE — AB
Protein,UA: NEGATIVE
Specific Gravity, UA: 1.015 (ref 1.005–1.030)
Urobilinogen, Ur: 0.2 mg/dL (ref 0.2–1.0)
pH, UA: 8.5 — ABNORMAL HIGH (ref 5.0–7.5)

## 2024-03-27 LAB — MICROSCOPIC EXAMINATION: WBC, UA: >30 /HPF — AB (ref 0–5)

## 2024-03-27 MED ORDER — CEPHALEXIN 500 MG PO CAPS: 500.0000 mg | ORAL_CAPSULE | Freq: Four times a day (QID) | ORAL | 0 refills | Status: AC

## 2024-03-27 NOTE — Progress Notes (Signed)
 BP (!) 144/72   Pulse 70   Temp 97.8 F (36.6 C)   Ht 5' 3 (1.6 m)   Wt 127 lb (57.6 kg)   SpO2 96%   BMI 22.50 kg/m    Subjective:   Patient ID: Greig JINNY Saba, female    DOB: 05-May-1956, 68 y.o.   MRN: [id number]  HPI: CHARIS JULIANA is a 68 y.o. female presenting on 03/27/2024 for Urinary Tract Infection   Discussed the use of AI scribe software for clinical note transcription with the patient, who gave verbal consent to proceed.  History of Present Illness   Briele SAMEENA ARTUS is a 68 year old female who presents with urinary frequency and burning sensation during urination.  Urinary symptoms - Onset of urinary frequency and burning sensation during urination beginning this morning - No hematuria, kidney pain, or flank pain - No fevers or chills - No vaginal symptoms including irritation, burning, or discharge - History of urinary tract infections, with last episode approximately 8 months to 1 year ago  Digital pain and swelling - Swelling and pain in fingers, particularly the pinky - Uses Voltaren  gel for symptom relief with insufficient effect - Considering alternative therapies such as Tylenol  Arthritis - Concerned about hand function, especially with upcoming grandchild due in February  Anticoagulation therapy - Currently taking Coumadin  - Blood checked every eight weeks          Relevant past medical, surgical, family and social history reviewed and updated as indicated. Interim medical history since our last visit reviewed. Allergies and medications reviewed and updated.  Review of Systems  Constitutional:  Negative for chills and fever.  Eyes:  Negative for visual disturbance.  Respiratory:  Negative for chest tightness and shortness of breath.   Cardiovascular:  Negative for chest pain and leg swelling.  Gastrointestinal:  Negative for abdominal pain.  Genitourinary:  Positive for dysuria and frequency. Negative for difficulty urinating, flank pain,  hematuria, urgency, vaginal bleeding, vaginal discharge and vaginal pain.  Skin:  Negative for rash.  Neurological:  Negative for dizziness, light-headedness and headaches.  Psychiatric/Behavioral:  Negative for agitation and behavioral problems.   All other systems reviewed and are negative.   Per HPI unless specifically indicated above   Allergies as of 03/27/2024       Reactions   Codeine    Donnatal [belladonna Alk-phenobarb Er]    Librium         Medication List        Accurate as of March 27, 2024 10:16 AM. If you have any questions, ask your nurse or doctor.          acetic acid  2 % otic solution Place 4 drops into both ears in the morning and at bedtime.   azelastine  0.1 % nasal spray Commonly known as: ASTELIN  Place 1 spray into both nostrils 2 (two) times daily. Use in each nostril as directed   cephALEXin  500 MG capsule Commonly known as: KEFLEX  Take 1 capsule (500 mg total) by mouth 4 (four) times daily. Started by: Lorraina Spring A Iyauna Sing   fluticasone  50 MCG/ACT nasal spray Commonly known as: FLONASE  USE 2 SPRAYS IN EACH NOSTRIL DAILY   hydrochlorothiazide  12.5 MG capsule Commonly known as: MICROZIDE  TAKE ONE (1) CAPSULE EACH DAY   triamcinolone  cream 0.1 % Commonly known as: KENALOG  Apply 1 Application topically 2 (two) times daily as needed.   warfarin 5 MG tablet Commonly known as: COUMADIN  Take as directed by the anticoagulation clinic.  If you are unsure how to take this medication, talk to your nurse or doctor. Original instructions: Take 2 tablets (10 mg total) by mouth daily.         Objective:   BP (!) 144/72   Pulse 70   Temp 97.8 F (36.6 C)   Ht 5' 3 (1.6 m)   Wt 127 lb (57.6 kg)   SpO2 96%   BMI 22.50 kg/m   Wt Readings from Last 3 Encounters:  03/27/24 127 lb (57.6 kg)  02/20/24 129 lb (58.5 kg)  02/14/24 129 lb 2 oz (58.6 kg)    Physical Exam Physical Exam   CHEST: Lungs clear to auscultation. CARDIOVASCULAR:  Heart sounds regular.         Assessment & Plan:   Problem List Items Addressed This Visit       Other   Dysuria - Primary   Relevant Medications   cephALEXin  (KEFLEX ) 500 MG capsule       Acute urinary tract infection Urinalysis confirmed UTI with >30 WBCs, bacteria, nitrite positive, leukocytes, and trace blood. - Prescribed Keflex .  Hand osteoarthritis Chronic hand pain and swelling affecting daily activities. Voltaren  gel provides some relief. Discussed cortisone injections and potential impact on Coumadin  levels. Suggested Tylenol  Arthritis as an alternative to NSAIDs. Discussed ice and heat therapy. Mentioned radiation therapy as a newer option. - Continue Voltaren  gel. - Start Tylenol  Arthritis. - Consider cortisone injections; contact orthopedics. - Use ice for 15 minutes followed by heat for 15 minutes. - Explore alternative topical treatments like Biofreeze. - Research radiation therapy and discuss with Doctor Jolinda.          Follow up plan: Return if symptoms worsen or fail to improve.  Counseling provided for all of the vaccine components Orders Placed This Encounter  Procedures   Urine Culture   Urinalysis, Complete    Fonda Levins, MD [facility] 03/27/2024, 10:16 AM

## 2024-03-27 NOTE — Telephone Encounter (Signed)
 FYI Only or Action Required?: FYI only for provider: appointment scheduled on 11/7.  Patient was last seen in primary care on 02/14/2024 by [name], DO.  Called Nurse Triage reporting urinary pain.  Symptoms began today.  Interventions attempted: Nothing.  Symptoms are: unchanged.  Triage Disposition: See Physician Within 24 Hours  Patient/caregiver understands and will follow disposition?: Yes  Copied from CRM #[id number]. Topic: Clinical - Red Word Triage >> Mar 27, 2024  8:16 AM [name] wrote: Red Word that prompted transfer to Nurse Triage: [place] issue/ UTI/urnine more  often then usually/Burning Reason for Disposition  All other patients with painful urination  (Exception: [1] EITHER frequency or urgency AND [2] has on-call doctor.)  Answer Assessment - Initial Assessment Questions 1. SEVERITY: How bad is the pain?  (e.g., Scale 1-10; mild, moderate, or severe)     6/10 2. FREQUENCY: How many times have you had painful urination today?      3 times 3. PATTERN: Is pain present every time you urinate or just sometimes?      Every time  4. ONSET: When did the painful urination start?      Started this morning 5. FEVER: Do you have a fever? If Yes, ask: What is your temperature, how was it measured, and when did it start?     no 6. PAST UTI: Have you had a urine infection before? If Yes, ask: When was the last time? and What happened that time?      Yes- it has been a while- 6-8 months ago 7. CAUSE: What do you think is causing the painful urination?  (e.g., UTI, scratch, Herpes sore)     UTI 8. OTHER SYMPTOMS: Do you have any other symptoms? (e.g., blood in urine, flank pain, genital sores, urgency, vaginal discharge)     frequency  Protocols used: Urination Pain - Female-A-AH

## 2024-03-27 NOTE — Telephone Encounter (Signed)
 Appt made.

## 2024-04-01 ENCOUNTER — Ambulatory Visit: Payer: Self-pay | Admitting: Family Medicine

## 2024-04-01 LAB — URINE CULTURE

## 2024-04-10 ENCOUNTER — Ambulatory Visit: Payer: Self-pay | Admitting: Family Medicine

## 2024-04-10 ENCOUNTER — Encounter: Payer: Self-pay | Admitting: Family Medicine

## 2024-04-10 VITALS — BP 126/78 | HR 75 | Temp 98.0°F | Ht 63.0 in | Wt 129.4 lb

## 2024-04-10 DIAGNOSIS — I10 Essential (primary) hypertension: Secondary | ICD-10-CM

## 2024-04-10 DIAGNOSIS — D6851 Activated protein C resistance: Secondary | ICD-10-CM | POA: Diagnosis not present

## 2024-04-10 DIAGNOSIS — Z5181 Encounter for therapeutic drug level monitoring: Secondary | ICD-10-CM | POA: Diagnosis not present

## 2024-04-10 DIAGNOSIS — Z7901 Long term (current) use of anticoagulants: Secondary | ICD-10-CM

## 2024-04-10 LAB — COAGUCHEK XS/INR WAIVED
INR: 2.4 — ABNORMAL HIGH (ref 0.9–1.1)
Prothrombin Time: 28.5 s

## 2024-04-10 NOTE — Progress Notes (Signed)
 Subjective: CC: Factor V Leyden mutation PCP: [name], DO HPI:[name] is a 68 y.o. female presenting to clinic today for:  Patient here for interval follow-up on factor V Leyden mutation.  She is compliant with her Coumadin  daily.  Reports no abnormal bleeding.  No changes in diet or activities.   ROS: Per HPI  Allergies  Allergen Reactions   Codeine    Donnatal [Belladonna Alk-Phenobarb Er]    Librium    Past Medical History:  Diagnosis Date   Anemia    Factor V Leiden    Hypertension    Stroke [facility])     Current Outpatient Medications:    acetic acid  2 % otic solution, Place 4 drops into both ears in the morning and at bedtime., Disp: 15 mL, Rfl: 0   azelastine  (ASTELIN ) 0.1 % nasal spray, Place 1 spray into both nostrils 2 (two) times daily. Use in each nostril as directed, Disp: 30 mL, Rfl: 2   fluticasone  (FLONASE ) 50 MCG/ACT nasal spray, USE 2 SPRAYS IN EACH NOSTRIL DAILY, Disp: 16 g, Rfl: 6   hydrochlorothiazide  (MICROZIDE ) 12.5 MG capsule, TAKE ONE (1) CAPSULE EACH DAY, Disp: 90 capsule, Rfl: 0   triamcinolone  cream (KENALOG ) 0.1 %, Apply 1 Application topically 2 (two) times daily as needed., Disp: 30 g, Rfl: 1   warfarin (COUMADIN ) 5 MG tablet, Take 2 tablets (10 mg total) by mouth daily., Disp: 180 tablet, Rfl: 4   cephALEXin  (KEFLEX ) 500 MG capsule, Take 1 capsule (500 mg total) by mouth 4 (four) times daily. (Patient not taking: Reported on 04/10/2024), Disp: 28 capsule, Rfl: 0 Social History   Socioeconomic History   Marital status: Married    Spouse name: Not on file   Number of children: 2   Years of education: Not on file   Highest education level: Not on file  Occupational History   Occupation: Retired Diplomatic Services Operational Officer  Tobacco Use   Smoking status: Never    Passive exposure: Never   Smokeless tobacco: Never  Vaping Use   Vaping status: Never Used  Substance and Sexual Activity   Alcohol use: Not Currently    Alcohol/week: 0.0 standard  drinks of alcohol   Drug use: Not Currently   Sexual activity: Not on file  Other Topics Concern   Not on file  Social History Narrative   Not on file   Social Drivers of Health   Financial Resource Strain: Low Risk  (02/20/2024)   Overall Financial Resource Strain (CARDIA)    Difficulty of Paying Living Expenses: Not hard at all  Food Insecurity: No Food Insecurity (02/20/2024)   Hunger Vital Sign    Worried About Running Out of Food in the Last Year: Never true    Ran Out of Food in the Last Year: Never true  Transportation Needs: No Transportation Needs (02/20/2024)   PRAPARE - Administrator, Civil Service (Medical): No    Lack of Transportation (Non-Medical): No  Physical Activity: Sufficiently Active (02/20/2024)   Exercise Vital Sign    Days of Exercise per Week: 5 days    Minutes of Exercise per Session: 70 min  Stress: No Stress Concern Present (02/20/2024)   [employer] of Occupational Health - Occupational Stress Questionnaire    Feeling of Stress: Not at all  Social Connections: Moderately Integrated (02/20/2024)   Social Connection and Isolation Panel    Frequency of Communication with Friends and Family: More than three times a week  Frequency of Social Gatherings with Friends and Family: More than three times a week    Attends Religious Services: More than 4 times per year    Active Member of [employer] or Organizations: No    Attends Banker Meetings: Never    Marital Status: Married  Catering Manager Violence: Not At Risk (02/20/2024)   Humiliation, Afraid, Rape, and Kick questionnaire    Fear of Current or Ex-Partner: No    Emotionally Abused: No    Physically Abused: No    Sexually Abused: No   Family History  Problem Relation Age of Onset   Stroke Mother    Cancer Father    Breast cancer Sister 20   Diabetes Brother     Objective: Office vital signs reviewed. BP 126/78 Comment: manual  Pulse 75   Temp 98 F (36.7 C)   Ht  5' 3 (1.6 m)   Wt 129 lb 6 oz (58.7 kg)   SpO2 94%   BMI 22.92 kg/m   Physical Examination:  General: Awake, alert, well nourished, No acute distress HEENT: sclera white, MMM Cardio: regular rate and rhythm, S1S2 heard, no murmurs appreciated Pulm: clear to auscultation bilaterally, no wheezes, rhonchi or rales; normal work of breathing on room air  Assessment/ Plan: 68 y.o. female   Factor V Leiden mutation - Plan: CoaguChek XS/INR Waived  Anticoagulation goal of INR 2 to 3 - Plan: CoaguChek XS/INR Waived  Hypertension, essential   INR therapeutic at 2.4.  No changes.  8-week follow-up scheduled.  Blood pressure controlled upon manual recheck.  No changes   [name], DO [facility] [phone]

## 2024-04-12 ENCOUNTER — Other Ambulatory Visit: Payer: Self-pay | Admitting: Family Medicine

## 2024-04-12 DIAGNOSIS — I10 Essential (primary) hypertension: Secondary | ICD-10-CM

## 2024-04-29 ENCOUNTER — Other Ambulatory Visit: Payer: Self-pay | Admitting: Obstetrics and Gynecology

## 2024-04-29 DIAGNOSIS — Z9189 Other specified personal risk factors, not elsewhere classified: Secondary | ICD-10-CM

## 2024-05-07 ENCOUNTER — Encounter: Payer: Self-pay | Admitting: Nurse Practitioner

## 2024-05-07 ENCOUNTER — Ambulatory Visit (INDEPENDENT_AMBULATORY_CARE_PROVIDER_SITE_OTHER): Admitting: Nurse Practitioner

## 2024-05-07 VITALS — BP 128/73 | HR 84 | Temp 97.7°F | Ht 63.0 in | Wt 127.8 lb

## 2024-05-07 DIAGNOSIS — J321 Chronic frontal sinusitis: Secondary | ICD-10-CM | POA: Insufficient documentation

## 2024-05-07 MED ORDER — AZITHROMYCIN 250 MG PO TABS: ORAL_TABLET | ORAL | 0 refills | Status: AC

## 2024-05-07 NOTE — Progress Notes (Signed)
 Subjective:  Patient ID: [name], female    DOB: May 03, 1956, 68 y.o.   MRN: [id number]  Patient Care Team: [name], DO as PCP - General (Family Medicine) Lennard Lesta FALCON, MD as Consulting Physician (Gastroenterology) [place] (Gynecology) Leva Rush, MD as Consulting Physician (Obstetrics and Gynecology)   Chief Complaint:  Nasal Congestion (Symptoms started few days ago, negative flu and covid test on Tuesday ), Cough, and Headache   HPI: [name] is a 68 y.o. female presenting on 05/07/2024 for Nasal Congestion (Symptoms started few days ago, negative flu and covid test on Tuesday ), Cough, and Headache   Discussed the use of AI scribe software for clinical note transcription with the patient, who gave verbal consent to proceed.  History of Present Illness [name] is a 68 year old female who presents with cough, congestion, and runny nose.  She has been experiencing symptoms since Tuesday, including cough, congestion, runny nose, light headaches, and wheezing. No fever is present. A COVID-19 test performed on Tuesday was negative.  She has not taken any medication specifically for these symptoms, except for a couple of Tylenol  for her headache, which has since improved and is now very light. Her nasal drainage is usually clear.  She has a history of seasonal allergies and takes an allergy pill daily, specifically Xyzal, which she gets over the counter. She uses Flonase  and a nasal spray similar to Astelin , which she purchases over the counter, administering one spray of each in the morning and one at night.  She is also on warfarin.      Relevant past medical, surgical, family, and social history reviewed and updated as indicated.  Allergies and medications reviewed and updated. Data reviewed: Chart in Epic.   Past Medical History:  Diagnosis Date   Anemia    Factor V Leiden    Hypertension    Stroke [facility])      History reviewed. No pertinent surgical history.  Social History   Socioeconomic History   Marital status: Married    Spouse name: Not on file   Number of children: 2   Years of education: Not on file   Highest education level: Not on file  Occupational History   Occupation: Retired Diplomatic Services Operational Officer  Tobacco Use   Smoking status: Never    Passive exposure: Never   Smokeless tobacco: Never  Vaping Use   Vaping status: Never Used  Substance and Sexual Activity   Alcohol use: Not Currently    Alcohol/week: 0.0 standard drinks of alcohol   Drug use: Not Currently   Sexual activity: Not on file  Other Topics Concern   Not on file  Social History Narrative   Not on file   Social Drivers of Health   Tobacco Use: Low Risk (05/07/2024)   Patient History    Smoking Tobacco Use: Never    Smokeless Tobacco Use: Never    Passive Exposure: Never  Financial Resource Strain: Low Risk (02/20/2024)   Overall Financial Resource Strain (CARDIA)    Difficulty of Paying Living Expenses: Not hard at all  Food Insecurity: No Food Insecurity (02/20/2024)   Epic    Worried About Radiation Protection Practitioner of Food in the Last Year: Never true    Ran Out of Food in the Last Year: Never true  Transportation Needs: No Transportation Needs (02/20/2024)   Epic    Lack of Transportation (Medical): No    Lack of Transportation (  Non-Medical): No  Physical Activity: Sufficiently Active (02/20/2024)   Exercise Vital Sign    Days of Exercise per Week: 5 days    Minutes of Exercise per Session: 70 min  Stress: No Stress Concern Present (02/20/2024)   [employer] of Occupational Health - Occupational Stress Questionnaire    Feeling of Stress: Not at all  Social Connections: Moderately Integrated (02/20/2024)   Social Connection and Isolation Panel    Frequency of Communication with Friends and Family: More than three times a week    Frequency of Social Gatherings with Friends and Family: More than three times a week     Attends Religious Services: More than 4 times per year    Active Member of Clubs or Organizations: No    Attends Banker Meetings: Never    Marital Status: Married  Catering Manager Violence: Not At Risk (02/20/2024)   Epic    Fear of Current or Ex-Partner: No    Emotionally Abused: No    Physically Abused: No    Sexually Abused: No  Depression (PHQ2-9): Low Risk (04/10/2024)   Depression (PHQ2-9)    PHQ-2 Score: 0  Alcohol Screen: Low Risk (02/20/2024)   Alcohol Screen    Last Alcohol Screening Score (AUDIT): 0  Housing: Unknown (02/20/2024)   Epic    Unable to Pay for Housing in the Last Year: No    Number of Times Moved in the Last Year: Not on file    Homeless in the Last Year: No  Utilities: Not At Risk (02/20/2024)   Epic    Threatened with loss of utilities: No  Health Literacy: Adequate Health Literacy (02/20/2024)   B1300 Health Literacy    Frequency of need for help with medical instructions: Never    Outpatient Encounter Medications as of 05/07/2024  Medication Sig   acetic acid  2 % otic solution Place 4 drops into both ears in the morning and at bedtime.   azelastine  (ASTELIN ) 0.1 % nasal spray Place 1 spray into both nostrils 2 (two) times daily. Use in each nostril as directed   azithromycin  (ZITHROMAX ) 250 MG tablet Take 2 tablets on day 1, then 1 tablet daily on days 2 through 5   fluticasone  (FLONASE ) 50 MCG/ACT nasal spray USE 2 SPRAYS IN EACH NOSTRIL DAILY   hydrochlorothiazide  (MICROZIDE ) 12.5 MG capsule TAKE ONE (1) CAPSULE EACH DAY   triamcinolone  cream (KENALOG ) 0.1 % Apply 1 Application topically 2 (two) times daily as needed.   warfarin (COUMADIN ) 5 MG tablet Take 2 tablets (10 mg total) by mouth daily.   cephALEXin  (KEFLEX ) 500 MG capsule Take 1 capsule (500 mg total) by mouth 4 (four) times daily. (Patient not taking: Reported on 05/07/2024)   No facility-administered encounter medications on file as of 05/07/2024.     Allergies[1]  Pertinent ROS per HPI, otherwise unremarkable      Objective:  BP 128/73   Pulse 84   Temp 97.7 F (36.5 C) (Temporal)   Ht 5' 3 (1.6 m)   Wt 127 lb 12.8 oz (58 kg)   SpO2 97%   BMI 22.64 kg/m    Wt Readings from Last 3 Encounters:  05/07/24 127 lb 12.8 oz (58 kg)  04/10/24 129 lb 6 oz (58.7 kg)  03/27/24 127 lb (57.6 kg)    Physical Exam Vitals and nursing note reviewed.  Constitutional:      General: She is not in acute distress.    Appearance: Normal appearance.  HENT:  Head: Normocephalic and atraumatic.     Right Ear: Tympanic membrane, ear canal and external ear normal. There is no impacted cerumen.     Left Ear: Tympanic membrane, ear canal and external ear normal. There is no impacted cerumen.     Nose:     Right Turbinates: Swollen.     Left Turbinates: Swollen.     Right Sinus: Frontal sinus tenderness present.     Left Sinus: Frontal sinus tenderness present.  Eyes:     General: Scleral icterus present.     Extraocular Movements: Extraocular movements intact.     Conjunctiva/sclera: Conjunctivae normal.     Pupils: Pupils are equal, round, and reactive to light.  Cardiovascular:     Heart sounds: Normal heart sounds.  Pulmonary:     Effort: Pulmonary effort is normal.     Breath sounds: Normal breath sounds.  Musculoskeletal:        General: Normal range of motion.  Skin:    General: Skin is warm and dry.     Findings: No rash.  Neurological:     Mental Status: She is alert and oriented to person, place, and time.  Psychiatric:        Mood and Affect: Mood normal.        Behavior: Behavior normal.        Thought Content: Thought content normal.        Judgment: Judgment normal.    Physical Exam CHEST: Lungs clear to auscultation, no wheezing.     Results for orders placed or performed in visit on 04/10/24  CoaguChek XS/INR Waived   Collection Time: 04/10/24  2:51 PM  Result Value Ref Range   INR 2.4 (H) 0.9 - 1.1    Prothrombin Time 28.5 sec       Pertinent labs & imaging results that were available during my care of the patient were reviewed by me and considered in my medical decision making.  Assessment & Plan:  [name] was seen today for nasal congestion, cough and headache.  Diagnoses and all orders for this visit:  Sinusitis chronic, frontal -     azithromycin  (ZITHROMAX ) 250 MG tablet; Take 2 tablets on day 1, then 1 tablet daily on days 2 through 5     Assessment and Plan [name] is a 68 year old Caucasian female seen today for sinusitis, no acute distress Assessment & Plan Acute upper respiratory infection Negative flu and COVID tests. - Prescribed Z-Pak (azithromycin ) with instructions to take two tablets on the first day, followed by one tablet daily for the next four days.  Chronic frontal sinusitis Chronic condition with nasal inflammation. Flonase  is used twice daily, which is more than the prescribed once daily. - Continue Flonase  nasal spray once daily. - Continue Astelin  nasal spray twice daily.  Allergic rhinitis Chronic condition managed with Xyzal and nasal sprays. - Continue Xyzal daily.      Continue all other maintenance medications.  Follow up plan: Return if symptoms worsen or fail to improve.   Continue healthy lifestyle choices, including diet (rich in fruits, vegetables, and lean proteins, and low in salt and simple carbohydrates) and exercise (at least 30 minutes of moderate physical activity daily).  Educational handout given for   Clinical References  Sinus Infection, Adult A sinus infection is soreness and swelling (inflammation) of your sinuses. Sinuses are hollow spaces in the bones around your face. They are located: Around your eyes. In the middle of your forehead. Behind your nose. In  your cheekbones. Your sinuses and nasal passages are lined with a fluid called mucus. Mucus drains out of your sinuses. Swelling can trap mucus in your sinuses.  This lets germs (bacteria, virus, or fungus) grow, which leads to infection. Most of the time, this condition is caused by a virus. What are the causes? Allergies. Asthma. Germs. Things that block your nose or sinuses. Growths in the nose (nasal polyps). Chemicals or irritants in the air. A fungus. This is rare. What increases the risk? Having a weak body defense system (immune system). Doing a lot of swimming or diving. Using nasal sprays too much. Smoking. What are the signs or symptoms? The main symptoms of this condition are pain and a feeling of pressure around the sinuses. Other symptoms include: Stuffy nose (congestion). This may make it hard to breathe through your nose. Runny nose (drainage). Soreness, swelling, and warmth in the sinuses. A cough that may get worse at night. Being unable to smell and taste. Mucus that collects in the throat or the back of the nose (postnasal drip). This may cause a sore throat or bad breath. Being very tired (fatigued). A fever. How is this diagnosed? Your symptoms. Your medical history. A physical exam. Tests to find out if your condition is short-term (acute) or long-term (chronic). Your doctor may: Check your nose for growths (polyps). Check your sinuses using a tool that has a light on one end (endoscope). Check for allergies or germs. Do imaging tests, such as an MRI or CT scan. How is this treated? Treatment for this condition depends on the cause and whether it is short-term or long-term. If caused by a virus, your symptoms should go away on their own within 10 days. You may be given medicines to relieve symptoms. They include: Medicines that shrink swollen tissue in the nose. A spray that treats swelling of the nostrils. Rinses that help get rid of thick mucus in your nose (nasal saline washes). Medicines that treat allergies (antihistamines). Over-the-counter pain relievers. If caused by bacteria, your doctor may wait to see  if you will get better without treatment. You may be given antibiotic medicine if you have: A very bad infection. A weak body defense system. If caused by growths in the nose, surgery may be needed. Follow these instructions at home: Medicines Take, use, or apply over-the-counter and prescription medicines only as told by your doctor. These may include nasal sprays. If you were prescribed an antibiotic medicine, take it as told by your doctor. Do not stop taking it even if you start to feel better. Hydrate and humidify  Drink enough water to keep your pee (urine) pale yellow. Use a cool mist humidifier to keep the humidity level in your home above 50%. Breathe in steam for 10-15 minutes, 3-4 times a day, or as told by your doctor. You can do this in the bathroom while a hot shower is running. Try not to spend time in cool or dry air. Rest Rest as much as you can. Sleep with your head raised (elevated). Make sure you get enough sleep each night. General instructions  Put a warm, moist washcloth on your face 3-4 times a day, or as often as told by your doctor. Use nasal saline washes as often as told by your doctor. Wash your hands often with soap and water. If you cannot use soap and water, use hand sanitizer. Do not smoke. Avoid being around people who are smoking (secondhand smoke). Keep all follow-up visits. Contact  a doctor if: You have a fever. Your symptoms get worse. Your symptoms do not get better within 10 days. Get help right away if: You have a very bad headache. You cannot stop vomiting. You have very bad pain or swelling around your face or eyes. You have trouble seeing. You feel confused. Your neck is stiff. You have trouble breathing. These symptoms may be an emergency. Get help right away. Call 911. Do not wait to see if the symptoms will go away. Do not drive yourself to the hospital. Summary A sinus infection is swelling of your sinuses. Sinuses are hollow  spaces in the bones around your face. This condition is caused by tissues in your nose that become inflamed or swollen. This traps germs. These can lead to infection. If you were prescribed an antibiotic medicine, take it as told by your doctor. Do not stop taking it even if you start to feel better. Keep all follow-up visits. This information is not intended to replace advice given to you by your health care provider. Make sure you discuss any questions you have with your health care provider. Document Revised: 04/11/2021 Document Reviewed: 04/11/2021 Elsevier Patient Education  2024 Elsevier Inc. How to Perform a Sinus Rinse A sinus rinse is a home treatment. It rinses your sinuses with a mixture of salt and water (saline solution). Sinuses are air-filled spaces in your skull behind the bones of your face and forehead. They open into your nasal cavity. A sinus rinse can help to clear your nasal cavity. It can clear mucus, dirt, dust, or pollen. You may do a sinus rinse when you have: A cold. A virus. Allergies. A sinus infection. A stuffy nose. What are the risks? A sinus rinse is normally very safe and helpful. However, there are a few risks. These include: A burning feeling in the sinuses. This may happen if you do not make the saline solution as told. Follow all directions. Nasal irritation. Infection from unclean water. This is rare, but it can happen. Do not do a sinus rinse if you have had: Ear or nasal surgery. An ear infection. Plugged ears. Supplies needed: Saline solution or powder. Distilled or germ-free (sterile) water may be needed to mix with saline powder. You may use boiled and cooled tap water. Boil tap water for 5 minutes. Cool the water until it is lukewarm. Use within 24 hours. Do not use regular tap water to mix with the saline solution. Neti pot or nasal rinse bottle. These release the saline solution into your nose and through your sinuses. You can buy neti  pots and rinse bottles: At your local pharmacy. At a health food store. Online. How to do a sinus rinse  Wash your hands with soap and water for at least 20 seconds. If you cannot use soap and water, use hand sanitizer. Wash your device using the directions that came with it. Dry your device. Use the solution that comes with your device or one that is sold separately in stores. Follow the mixing directions on the package if you need to mix with germ-free or distilled water. Fill your device with the amount of saline solution stated in the device instructions. Stand by a sink and tilt your head sideways over the sink. Place the spout of the device in your upper nostril (the one closer to the ceiling). Gently pour or squeeze the saline solution into your nasal cavity. The liquid should drain to your lower nostril if you are not too  stuffed up (congested). While rinsing, breathe through your open mouth. Gently blow your nose to clear any mucus and rinse solution. Blowing too hard may cause ear pain. Turn your head in the other direction and repeat in your other nostril. Clean and rinse your device with clean water. Air-dry your device. Talk with your doctor or pharmacist if you have questions about how to do a sinus rinse. Summary A sinus rinse is a home treatment. It rinses your sinuses with a mixture of salt and water (saline solution). A sinus rinse can clear mucus, dirt, dust, or pollen. A sinus rinse is normally very safe and helpful. Follow all instructions carefully. This information is not intended to replace advice given to you by your health care provider. Make sure you discuss any questions you have with your health care provider. Document Revised: 10/24/2020 Document Reviewed: 10/24/2020 Elsevier Patient Education  2024 Elsevier Inc.  The above assessment and management plan was discussed with the patient. The patient verbalized understanding of and has agreed to the management  plan. Patient is aware to call the clinic if they develop any new symptoms or if symptoms persist or worsen. Patient is aware when to return to the clinic for a follow-up visit. Patient educated on when it is appropriate to go to the emergency department.   Nena Cassis Morton Hummer, DNP [facility] [address] [place], [place] [address] [phone]       [1]  Allergies Allergen Reactions   Codeine    Donnatal [Belladonna Alk-Phenobarb Er]    Librium

## 2024-05-08 ENCOUNTER — Encounter: Payer: Self-pay | Admitting: Cardiovascular Disease

## 2024-05-08 MED ORDER — CEFDINIR 300 MG PO CAPS: 300.0000 mg | ORAL_CAPSULE | Freq: Two times a day (BID) | ORAL | 0 refills | Status: DC

## 2024-05-19 ENCOUNTER — Telehealth: Payer: Self-pay | Admitting: Family Medicine

## 2024-05-19 NOTE — Telephone Encounter (Signed)
 Patient has appt 06-12-2024 for INR with Dr. KANDICE at 2:35. Patient came by with lab orders for Dr. McComb and I gave the copy of orders to [facility] to add with Dr. Talmadge orders. Patient is wanting to have INR and other labs done together that morning at 8:35 for her appt with labs.

## 2024-05-19 NOTE — Telephone Encounter (Signed)
 Spoke with nurse at Dr. Tawnya office, advised nurse that labs would not be collected without the orders being put in. Nurse stated that she would put the order in though [facility].

## 2024-05-19 NOTE — Telephone Encounter (Signed)
Noted  -LS

## 2024-06-05 ENCOUNTER — Telehealth: Payer: Self-pay | Admitting: Family Medicine

## 2024-06-05 DIAGNOSIS — Z7901 Long term (current) use of anticoagulants: Secondary | ICD-10-CM

## 2024-06-05 NOTE — Telephone Encounter (Signed)
 Patient coming 1-23 do to her labs for Dt. G and another provider. Wants to do her labs for Dr. KANDICE for INR at the same time. She has appt later on that day with Dr. KANDICE. Please place orders.

## 2024-06-08 NOTE — Telephone Encounter (Signed)
 I contacted patient. I informed that I put in the order for coagulation check.  She says she handed a paper to [facility] with labs that Dr.McCombs wants to have done as well.

## 2024-06-08 NOTE — Addendum Note (Signed)
 Addended by: MILAS CONGRESS D on: 06/08/2024 05:33 PM   Modules accepted: Orders

## 2024-06-12 ENCOUNTER — Other Ambulatory Visit

## 2024-06-12 ENCOUNTER — Ambulatory Visit (INDEPENDENT_AMBULATORY_CARE_PROVIDER_SITE_OTHER): Admitting: Family Medicine

## 2024-06-12 ENCOUNTER — Other Ambulatory Visit: Payer: Self-pay

## 2024-06-12 VITALS — BP 154/82 | HR 75 | Temp 97.7°F | Ht 61.0 in | Wt 129.0 lb

## 2024-06-12 DIAGNOSIS — R3989 Other symptoms and signs involving the genitourinary system: Secondary | ICD-10-CM | POA: Diagnosis not present

## 2024-06-12 DIAGNOSIS — Z7901 Long term (current) use of anticoagulants: Secondary | ICD-10-CM

## 2024-06-12 DIAGNOSIS — R300 Dysuria: Secondary | ICD-10-CM

## 2024-06-12 LAB — URINALYSIS, ROUTINE W REFLEX MICROSCOPIC
Bilirubin, UA: NEGATIVE
Glucose, UA: NEGATIVE
Ketones, UA: NEGATIVE
Nitrite, UA: NEGATIVE
Protein,UA: NEGATIVE
Specific Gravity, UA: 1.01 (ref 1.005–1.030)
Urobilinogen, Ur: 0.2 mg/dL (ref 0.2–1.0)
pH, UA: 7.5 (ref 5.0–7.5)

## 2024-06-12 LAB — COAGUCHEK XS/INR WAIVED
INR: 3.3 — ABNORMAL HIGH (ref 0.9–1.1)
INR: 2.9 — ABNORMAL HIGH (ref 0.9–1.1)
Prothrombin Time: 39.4 s
Prothrombin Time: 34.4 s

## 2024-06-12 LAB — MICROSCOPIC EXAMINATION
Renal Epithel, UA: NONE SEEN /HPF
WBC, UA: >30 /HPF — AB (ref 0–5)
Yeast, UA: NONE SEEN

## 2024-06-12 MED ORDER — NITROFURANTOIN MONOHYD MACRO 100 MG PO CAPS: 100.0000 mg | ORAL_CAPSULE | Freq: Two times a day (BID) | ORAL | 0 refills | Status: AC

## 2024-06-12 NOTE — Progress Notes (Signed)
 "  Subjective: CC: Factor V Leyden mutation PCP: [name], DO HPI:[name] is a 69 y.o. female presenting to clinic today for:  Follow-up INR for factor V Leyden mutation.  Goal INR 2-3 This morning she came in and have INR done and it was 3.3.  She notes that she had not eaten or drink her typical spinach smoothie and she would like to get this redone.  She denies any abnormal bleeding clean vaginal bleeding, rectal bleeding.  No change in exercise tolerance.  No concerns today  She is taking 10 mg of Coumadin  daily  ROS: Per HPI  Allergies[1] Past Medical History:  Diagnosis Date   Anemia    Factor V Leiden    Hypertension    Stroke (HCC)    Current Medications[2] Social History   Socioeconomic History   Marital status: Married    Spouse name: Not on file   Number of children: 2   Years of education: Not on file   Highest education level: Associate degree: occupational, scientist, product/process development, or vocational program  Occupational History   Occupation: Retired Diplomatic Services Operational Officer  Tobacco Use   Smoking status: Never    Passive exposure: Never   Smokeless tobacco: Never  Vaping Use   Vaping status: Never Used  Substance and Sexual Activity   Alcohol use: Not Currently    Alcohol/week: 0.0 standard drinks of alcohol   Drug use: Not Currently   Sexual activity: Not on file  Other Topics Concern   Not on file  Social History Narrative   Not on file   Social Drivers of Health   Tobacco Use: Low Risk (06/12/2024)   Patient History    Smoking Tobacco Use: Never    Smokeless Tobacco Use: Never    Passive Exposure: Never  Financial Resource Strain: Low Risk (06/12/2024)   Overall Financial Resource Strain (CARDIA)    Difficulty of Paying Living Expenses: Not hard at all  Food Insecurity: No Food Insecurity (06/12/2024)   Epic    Worried About Radiation Protection Practitioner of Food in the Last Year: Never true    Ran Out of Food in the Last Year: Never true  Transportation Needs: No  Transportation Needs (06/12/2024)   Epic    Lack of Transportation (Medical): No    Lack of Transportation (Non-Medical): No  Physical Activity: Sufficiently Active (06/12/2024)   Exercise Vital Sign    Days of Exercise per Week: 5 days    Minutes of Exercise per Session: 80 min  Stress: No Stress Concern Present (06/12/2024)   [employer] of Occupational Health - Occupational Stress Questionnaire    Feeling of Stress: Not at all  Social Connections: Socially Integrated (06/12/2024)   Social Connection and Isolation Panel    Frequency of Communication with Friends and Family: More than three times a week    Frequency of Social Gatherings with Friends and Family: Once a week    Attends Religious Services: More than 4 times per year    Active Member of Clubs or Organizations: Yes    Attends Banker Meetings: 1 to 4 times per year    Marital Status: Married  Catering Manager Violence: Not At Risk (02/20/2024)   Epic    Fear of Current or Ex-Partner: No    Emotionally Abused: No    Physically Abused: No    Sexually Abused: No  Depression (PHQ2-9): Low Risk (06/12/2024)   Depression (PHQ2-9)    PHQ-2 Score: 0  Alcohol Screen: Low Risk (  06/12/2024)   Alcohol Screen    Last Alcohol Screening Score (AUDIT): 5  Housing: Low Risk (06/12/2024)   Epic    Unable to Pay for Housing in the Last Year: No    Number of Times Moved in the Last Year: 0    Homeless in the Last Year: No  Utilities: Not At Risk (02/20/2024)   Epic    Threatened with loss of utilities: No  Health Literacy: Adequate Health Literacy (02/20/2024)   B1300 Health Literacy    Frequency of need for help with medical instructions: Never   Family History  Problem Relation Age of Onset   Stroke Mother    Cancer Father    Breast cancer Sister 21   Diabetes Brother     Objective: Office vital signs reviewed. BP (!) 148/84   Pulse 75   Temp 97.7 F (36.5 C)   Ht 5' 1 (1.549 m)   Wt 129 lb (58.5 kg)    SpO2 96%   BMI 24.37 kg/m   Physical Examination:  General: Awake, alert, well nourished, No acute distress HEENT: sclera white, MMM Cardio: regular rate and rhythm, S1S2 heard, no murmurs appreciated Pulm: clear to auscultation bilaterally, no wheezes, rhonchi or rales; normal work of breathing on room air  Assessment/ Plan: 69 y.o. female   Warfarin anticoagulation - Plan: CoaguChek XS/INR Waived, CoaguChek XS/INR Waived  Suspected UTI - Plan: CoaguChek XS/INR Waived, nitrofurantoin , macrocrystal-monohydrate, (MACROBID ) 100 MG capsule   Recheck INR.  6 to 8-week follow-up recommended.  If persistently elevated above 3 she will skip today's dosing.  Otherwise she can continue current dosage.  Urinalysis with evidence of possible urinary tract infection with over 30 white blood cells and some bacteria noted on microscopy.  Macrobid  sent as she was on a cephalosporin in December   Shanayah Kaffenberger M Frontenac, DO [facility] [phone]     [1]  Allergies Allergen Reactions   Codeine    Donnatal [Belladonna Alk-Phenobarb Er]    Librium   [2]  Current Outpatient Medications:    BONIVA 150 MG tablet, Take 1 tablet every month by oral route for 90 days., Disp: , Rfl:    estradiol (ESTRACE) 0.01 % CREA vaginal cream, , Disp: , Rfl:    fluticasone  (FLONASE ) 50 MCG/ACT nasal spray, USE 2 SPRAYS IN EACH NOSTRIL DAILY, Disp: 16 g, Rfl: 6   hydrochlorothiazide  (MICROZIDE ) 12.5 MG capsule, TAKE ONE (1) CAPSULE EACH DAY, Disp: 90 capsule, Rfl: 0   triamcinolone  cream (KENALOG ) 0.1 %, Apply 1 Application topically 2 (two) times daily as needed., Disp: 30 g, Rfl: 1   warfarin (COUMADIN ) 5 MG tablet, Take 2 tablets (10 mg total) by mouth daily., Disp: 180 tablet, Rfl: 4   acetic acid  2 % otic solution, Place 4 drops into both ears in the morning and at bedtime. (Patient not taking: Reported on 06/12/2024), Disp: 15 mL, Rfl: 0   azelastine  (ASTELIN ) 0.1 % nasal spray, Place 1  spray into both nostrils 2 (two) times daily. Use in each nostril as directed (Patient not taking: Reported on 06/12/2024), Disp: 30 mL, Rfl: 2   cefdinir  (OMNICEF ) 300 MG capsule, Take 1 capsule (300 mg total) by mouth 2 (two) times daily. 1 po BID (Patient not taking: Reported on 06/12/2024), Disp: 20 capsule, Rfl: 0  "

## 2024-06-17 ENCOUNTER — Ambulatory Visit: Payer: Self-pay | Admitting: Family Medicine

## 2024-06-17 NOTE — Telephone Encounter (Signed)
 Any idea if there is a drug interactoin?

## 2024-07-17 ENCOUNTER — Other Ambulatory Visit

## 2024-07-24 ENCOUNTER — Other Ambulatory Visit

## 2024-08-07 ENCOUNTER — Ambulatory Visit: Admitting: Family Medicine

## 2025-02-23 ENCOUNTER — Ambulatory Visit: Payer: Self-pay
